# Patient Record
Sex: Male | Born: 1965
Health system: Southern US, Community
[De-identification: ages and names within clinical notes are randomized; demographics above are authoritative.]

## PROBLEM LIST (undated history)

## (undated) DIAGNOSIS — M549 Dorsalgia, unspecified: Secondary | ICD-10-CM

## (undated) DIAGNOSIS — E785 Hyperlipidemia, unspecified: Secondary | ICD-10-CM

## (undated) DIAGNOSIS — F32A Depression, unspecified: Secondary | ICD-10-CM

## (undated) DIAGNOSIS — E079 Disorder of thyroid, unspecified: Secondary | ICD-10-CM

## (undated) DIAGNOSIS — M1990 Unspecified osteoarthritis, unspecified site: Secondary | ICD-10-CM

## (undated) DIAGNOSIS — F79 Unspecified intellectual disabilities: Secondary | ICD-10-CM

## (undated) DIAGNOSIS — T7840XA Allergy, unspecified, initial encounter: Secondary | ICD-10-CM

## (undated) DIAGNOSIS — F29 Unspecified psychosis not due to a substance or known physiological condition: Secondary | ICD-10-CM

## (undated) DIAGNOSIS — G8929 Other chronic pain: Secondary | ICD-10-CM

## (undated) DIAGNOSIS — F99 Mental disorder, not otherwise specified: Secondary | ICD-10-CM

## (undated) DIAGNOSIS — F329 Major depressive disorder, single episode, unspecified: Secondary | ICD-10-CM

## (undated) DIAGNOSIS — F419 Anxiety disorder, unspecified: Secondary | ICD-10-CM

## (undated) DIAGNOSIS — I10 Essential (primary) hypertension: Secondary | ICD-10-CM

## (undated) HISTORY — DX: Other chronic pain: G89.29

## (undated) HISTORY — DX: Anxiety disorder, unspecified: F41.9

## (undated) HISTORY — DX: Hyperlipidemia, unspecified: E78.5

## (undated) HISTORY — DX: Mental disorder, not otherwise specified: F99

## (undated) HISTORY — DX: Allergy, unspecified, initial encounter: T78.40XA

## (undated) HISTORY — DX: Unspecified intellectual disabilities: F79

## (undated) HISTORY — DX: Unspecified osteoarthritis, unspecified site: M19.90

## (undated) HISTORY — DX: Disorder of thyroid, unspecified: E07.9

## (undated) HISTORY — DX: Essential (primary) hypertension: I10

## (undated) HISTORY — DX: Unspecified psychosis not due to a substance or known physiological condition: F29

## (undated) HISTORY — DX: Depression, unspecified: F32.A

## (undated) HISTORY — DX: Major depressive disorder, single episode, unspecified: F32.9

## (undated) HISTORY — DX: Dorsalgia, unspecified: M54.9

---

## 2004-06-15 ENCOUNTER — Encounter (HOSPITAL_COMMUNITY): Admission: RE | Admit: 2004-06-15 | Discharge: 2004-06-15 | Payer: Self-pay | Admitting: Endocrinology

## 2004-06-18 ENCOUNTER — Ambulatory Visit (HOSPITAL_COMMUNITY): Admission: RE | Admit: 2004-06-18 | Discharge: 2004-06-19 | Payer: Self-pay | Admitting: Endocrinology

## 2004-07-06 ENCOUNTER — Inpatient Hospital Stay (HOSPITAL_COMMUNITY): Admission: EM | Admit: 2004-07-06 | Discharge: 2004-07-09 | Payer: Self-pay | Admitting: Emergency Medicine

## 2004-07-16 ENCOUNTER — Ambulatory Visit: Payer: Self-pay | Admitting: Family Medicine

## 2004-08-07 ENCOUNTER — Ambulatory Visit: Payer: Self-pay | Admitting: Family Medicine

## 2004-08-22 ENCOUNTER — Ambulatory Visit: Payer: Self-pay | Admitting: Family Medicine

## 2004-10-02 ENCOUNTER — Ambulatory Visit (HOSPITAL_COMMUNITY): Admission: RE | Admit: 2004-10-02 | Discharge: 2004-10-02 | Payer: Self-pay | Admitting: Family Medicine

## 2004-10-02 ENCOUNTER — Ambulatory Visit: Payer: Self-pay | Admitting: Family Medicine

## 2004-11-13 ENCOUNTER — Ambulatory Visit: Payer: Self-pay | Admitting: Family Medicine

## 2004-11-29 ENCOUNTER — Ambulatory Visit: Payer: Self-pay | Admitting: Family Medicine

## 2004-12-04 ENCOUNTER — Ambulatory Visit (HOSPITAL_COMMUNITY): Admission: RE | Admit: 2004-12-04 | Discharge: 2004-12-04 | Payer: Self-pay | Admitting: Family Medicine

## 2004-12-19 ENCOUNTER — Ambulatory Visit: Payer: Self-pay | Admitting: Family Medicine

## 2005-02-01 ENCOUNTER — Ambulatory Visit: Payer: Self-pay | Admitting: Family Medicine

## 2005-05-12 IMAGING — CT CT PELVIS W/O CM
1 of 2 series · 15 of 32 positions shown, 19 images · non-contrast
Comparison: None.

CLINICAL DATA: Mid abdominal pain. Nausea and vomiting. Renal insufficiency (creatinine 2.0).

CT OF THE ABDOMEN AND PELVIS WITHOUT CONTRAST  07/06/2004:
TECHNIQUE: Multidetector helical CT through the abdomen and pelvis was performed without
intravenous contrast. Oral contrast was given, but the patient vomited this soon after ingestion.

[Series 872: — · axial · 0.62mm/px · z∈[+1386,+1812]mm · 15 of 95 slices shown, 19 images]
[im 5/95  soft-tissue]
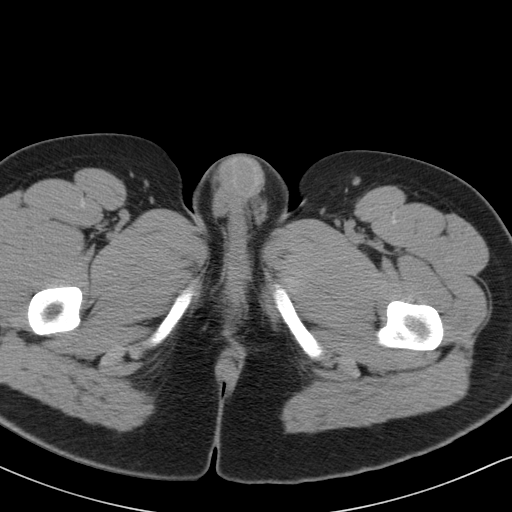
[im 5/95  bone]
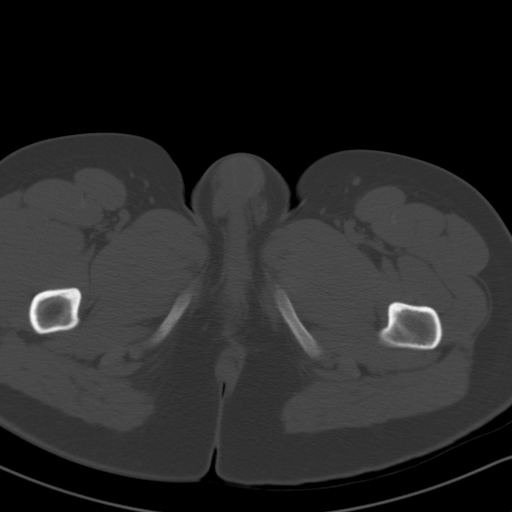
[im 13/95  soft-tissue]
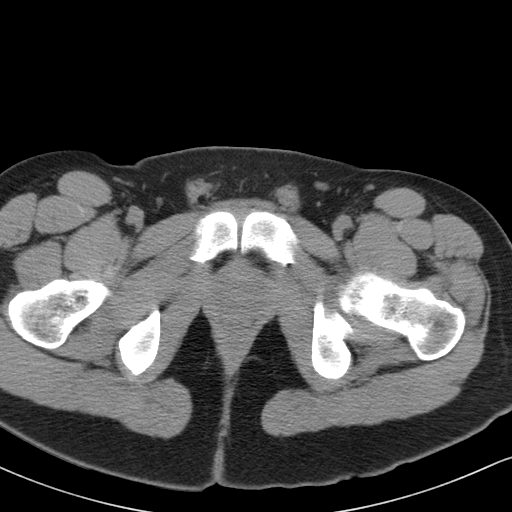
[im 21/95  soft-tissue]
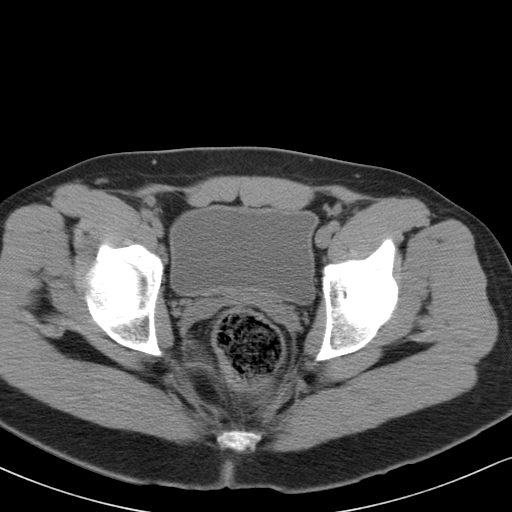
[im 25/95  soft-tissue]
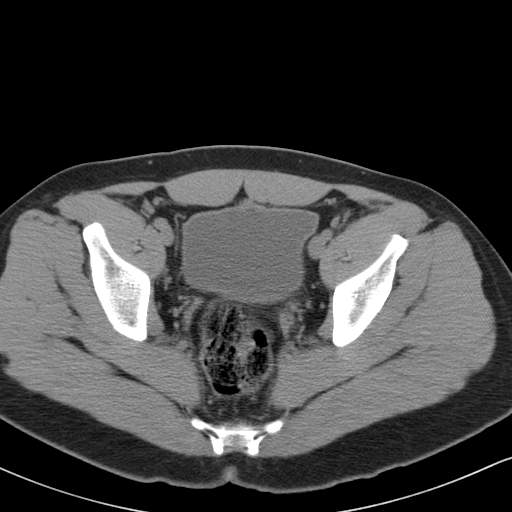
[im 33/95  soft-tissue]
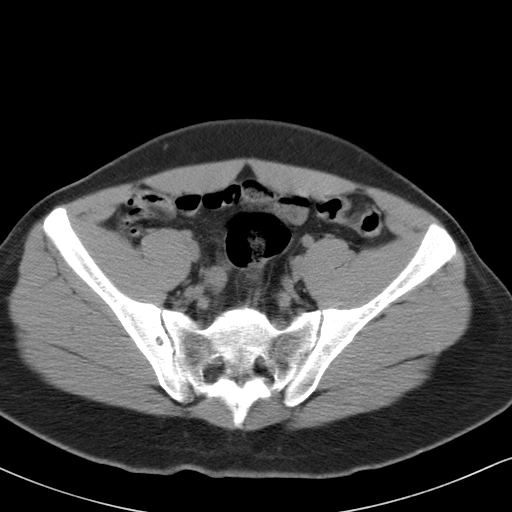
[im 41/95  soft-tissue]
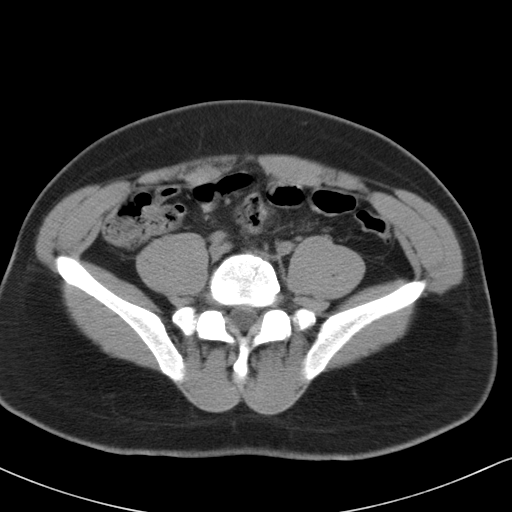
[im 50/95  soft-tissue]
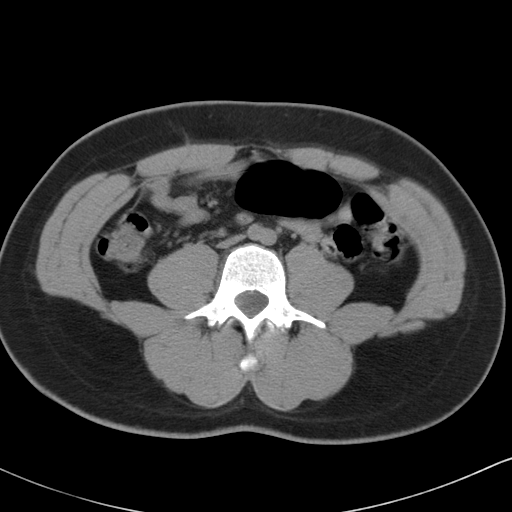
[im 54/95  soft-tissue]
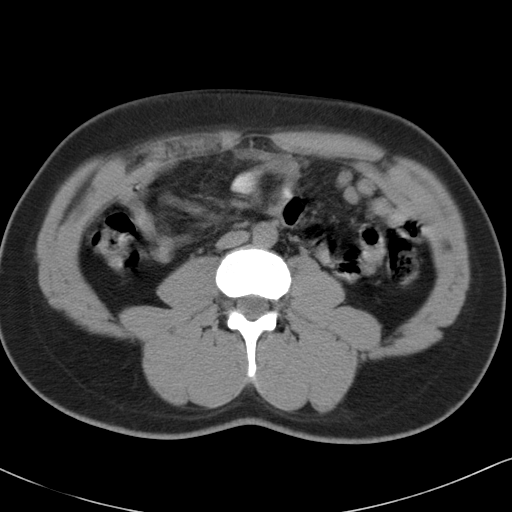
[im 62/95  soft-tissue]
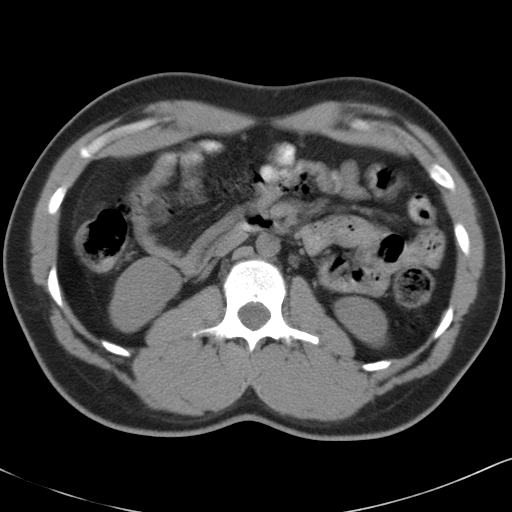
[im 62/95  bone]
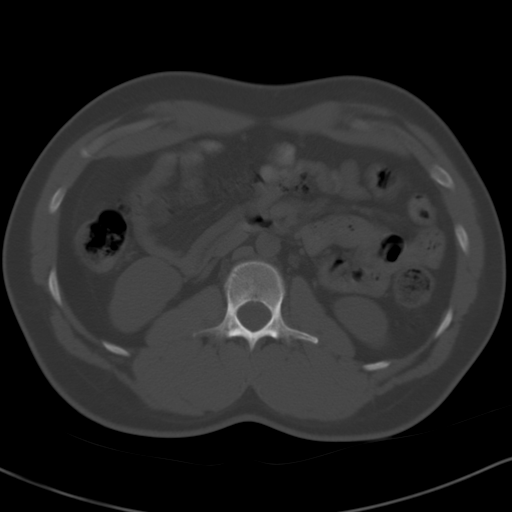
[im 70/95  soft-tissue]
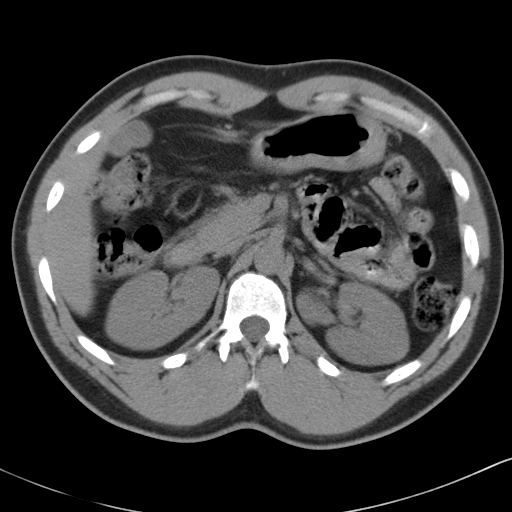
[im 74/95  soft-tissue]
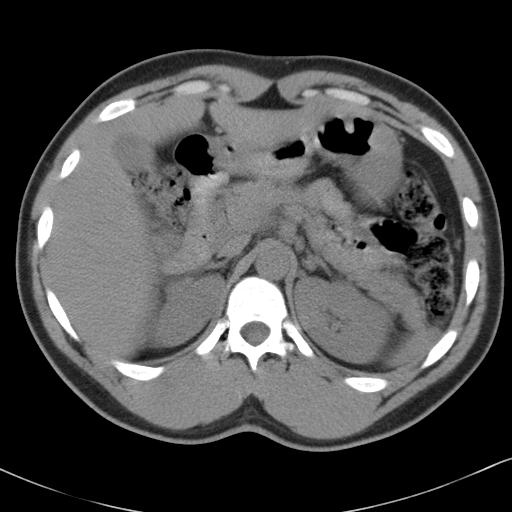
[im 78/95  lung]
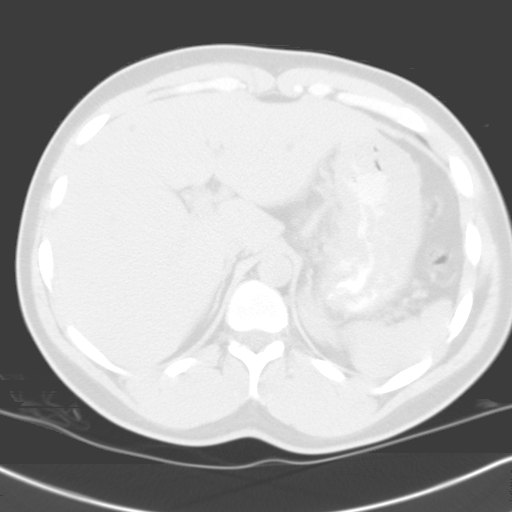
[im 82/95  soft-tissue]
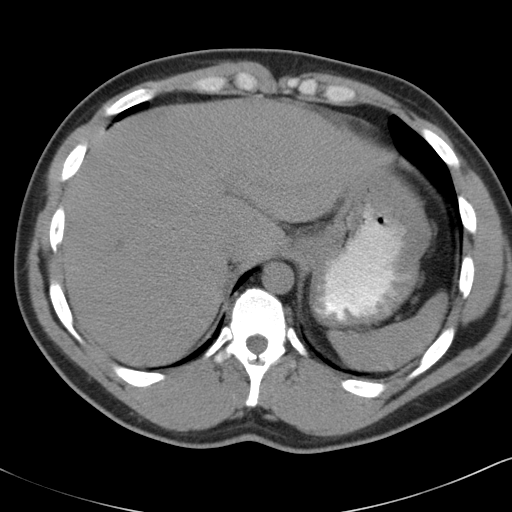
[im 82/95  lung]
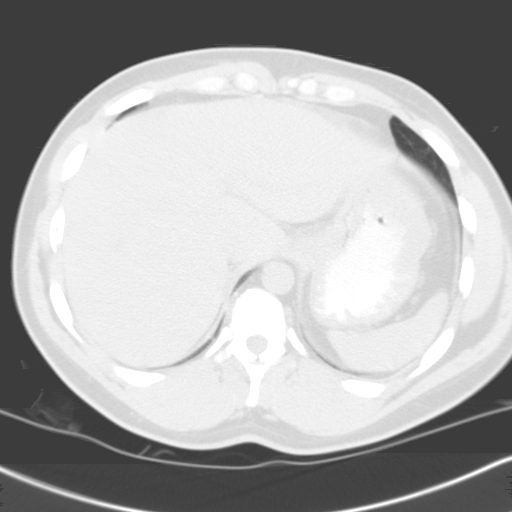
[im 86/95  lung]
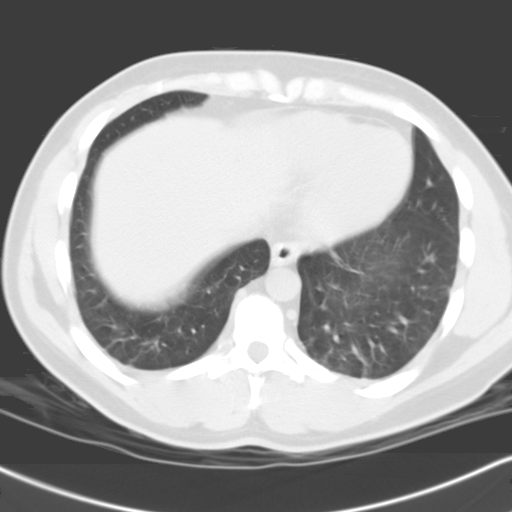
[im 90/95  soft-tissue]
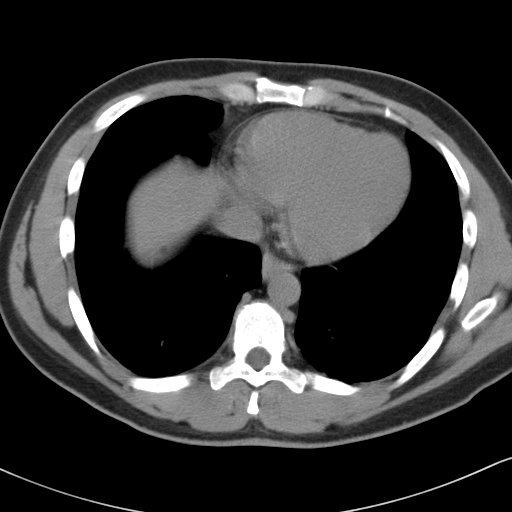
[im 90/95  lung]
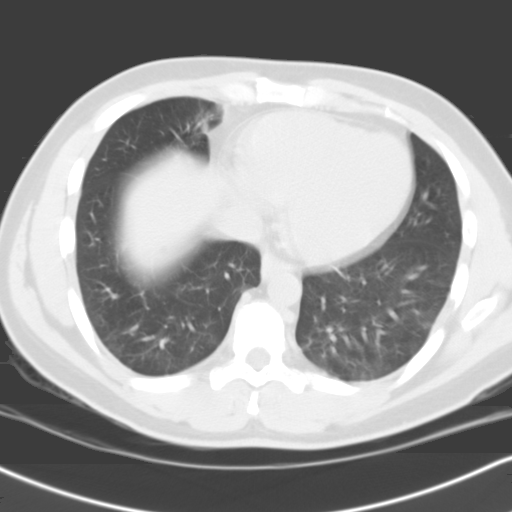

[15 of 32 positions shown; findings below may reference images not displayed]

CT ABDOMEN:

Numerous low attenuation foci are present throughout the liver; their conspicuity for their small
size suggest that they represent cysts. However, in the absence of IV contrast, I cannot state this
with absolute certainty.

The spleen, pancreas, and right adrenal gland are normal appearance. There is a 1 cm nodule
involving the left adrenal gland which is statistically most likely represent a small adenoma. Both
kidneys are unremarkable for the unenhanced technique. The stomach and the visualized large and
small bowel are unremarkable. There is no free fluid. There is no significant lymphadenopathy. The
visualized lung bases appear clear.
IMPRESSION: 1. Multiple low attenuation lesions throughout the liver. Their conspicuity for their small size
suggest that they represent cysts. In the absence of IV contrast, I cannot state this with absolute
certainty and therefore suggest ultrasound for further evaluation.

2. No acute abnormalities in the abdomen.

CT PELVIS:

A large amount of stool is present in the rectum and in the sigmoid colon. There are scattered
distal descending and sigmoid colon diverticula. There is no evidence of diverticulitis. The
appendix is identified in the right mid pelvis and is unremarkable. There is no free fluid. The
urinary bladder is normal. The seminal vesicles and prostate gland are normal for age.
IMPRESSION: 1. No acute abnormality is in the pelvis apart from possible constipation.

2. Scattered descending and sigmoid colon diverticula.

## 2005-05-15 IMAGING — US US ABDOMEN COMPLETE
1 series · 14 of 25 positions shown · non-contrast
Comparison: none

HISTORY: Recurrent nausea and vomiting

ABDOMEN ULTRASOUND COMPLETE:
Gallbladder normal without stones or wall thickening.
Common bile duct normal caliber, 4 mm diameter.
Liver, spleen, and pancreas normal appearance.
Kidneys normal appearance, 10.9 cm length right and 10.6 cm length left.
Aorta and IVC unremarkable.
No ascites.

[Series 1: unknown · 0.34mm/px · 14 of 70 slices shown]
[im 1/70]
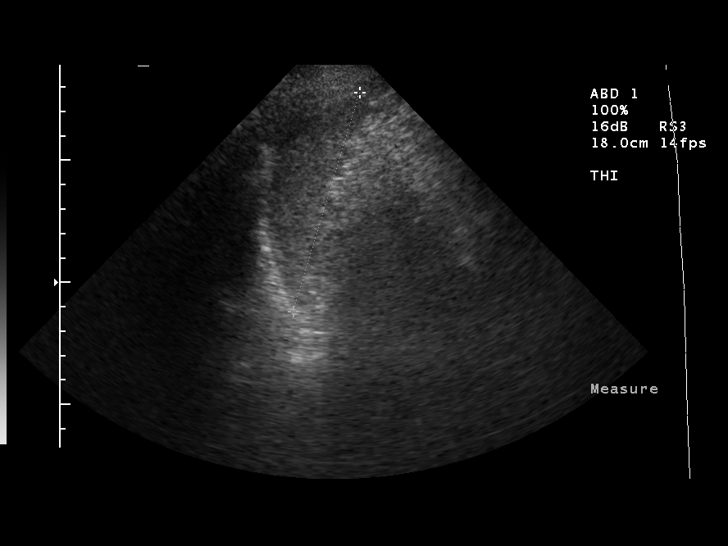
[im 6/70]
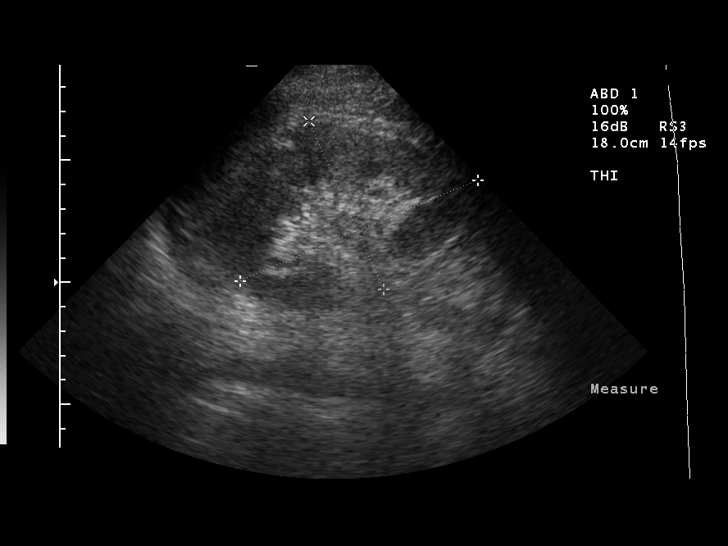
[im 12/70]
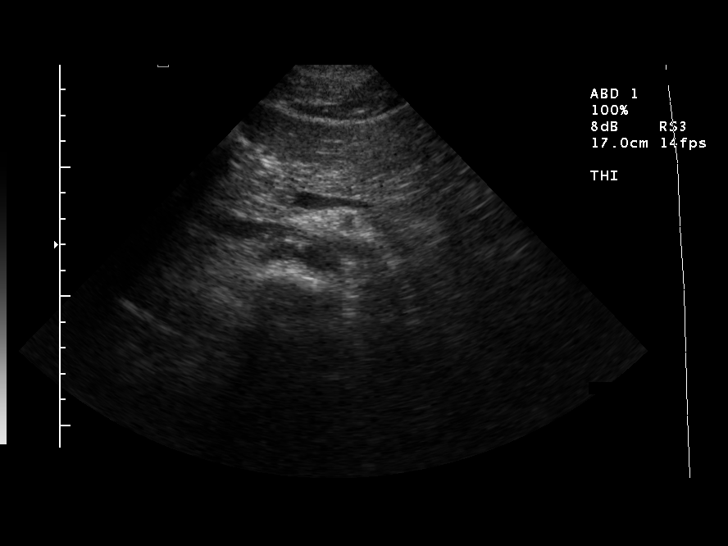
[im 18/70]
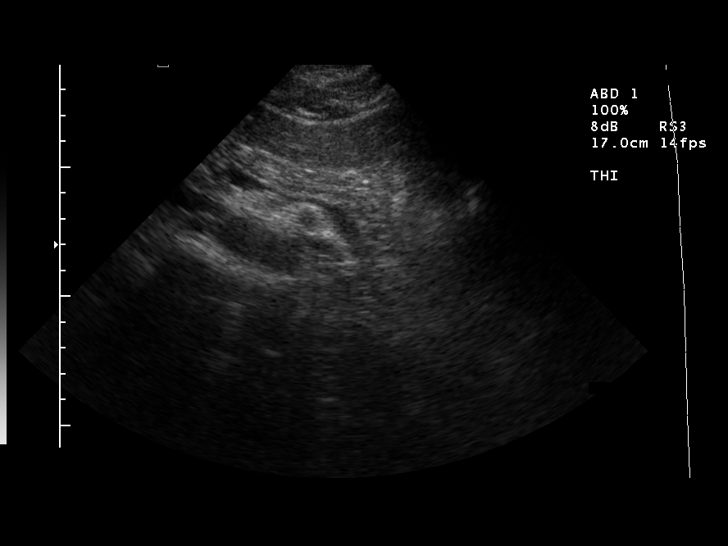
[im 24/70]
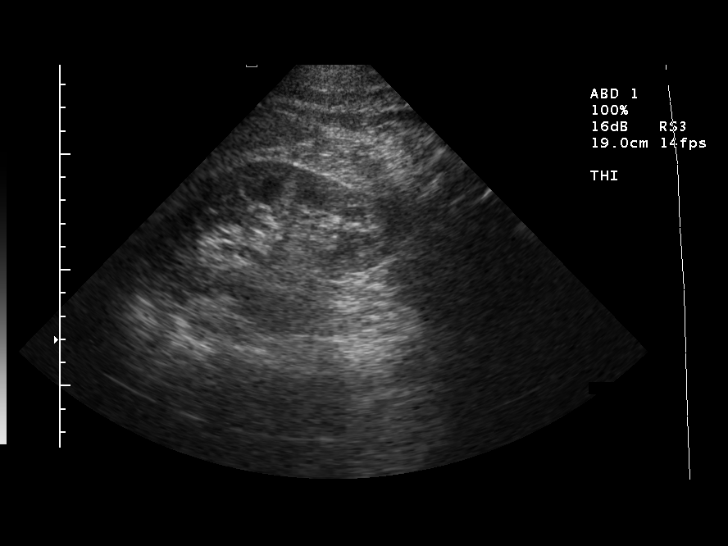
[im 26/70]
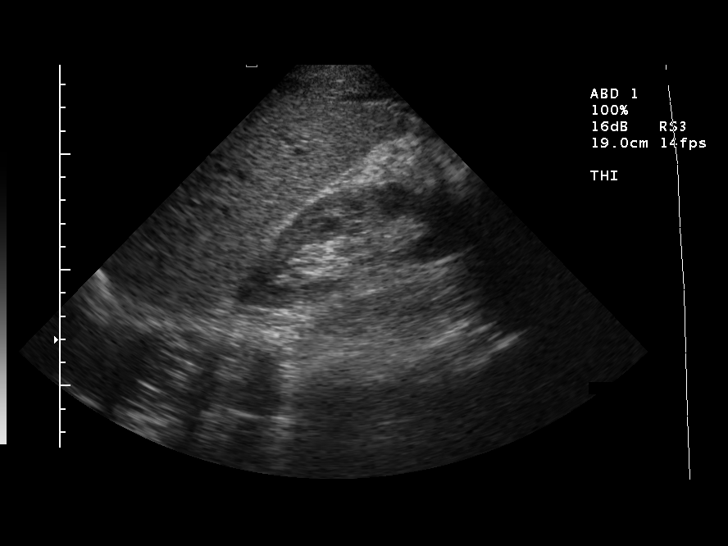
[im 32/70]
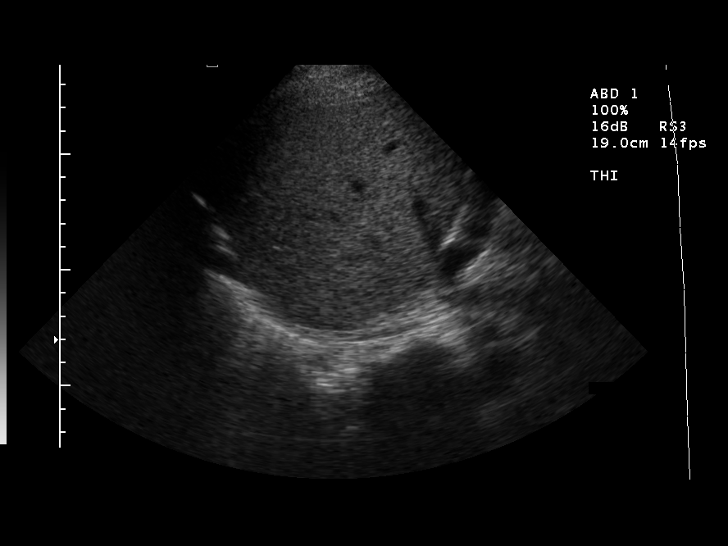
[im 38/70]
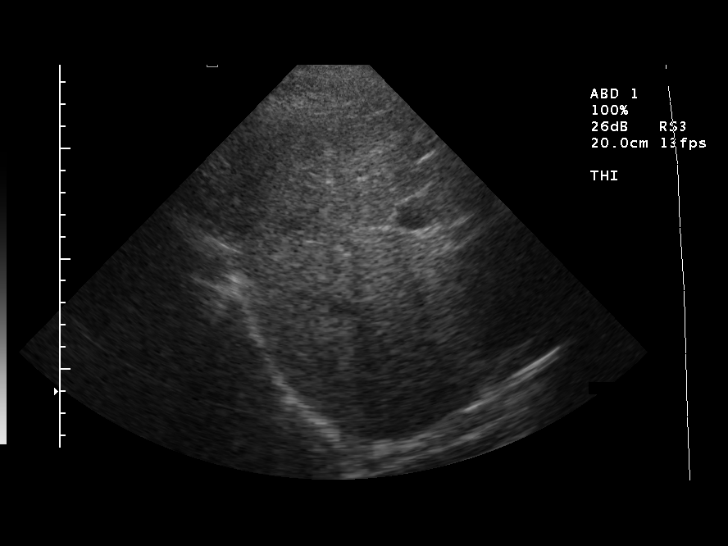
[im 44/70]
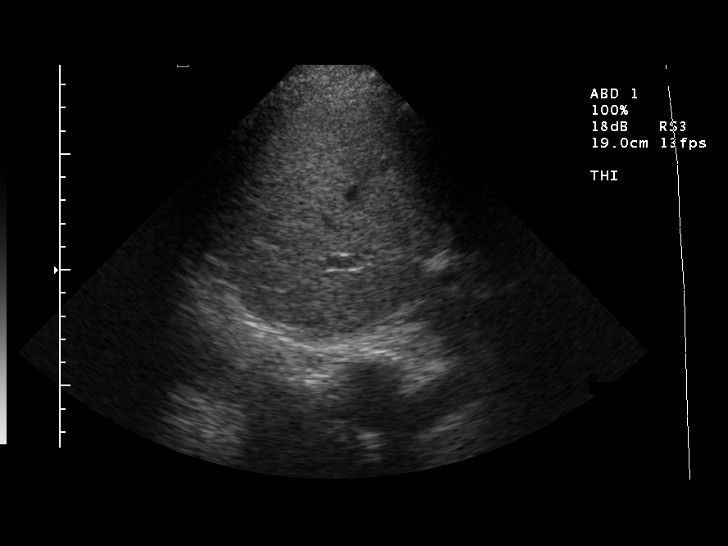
[im 47/70]
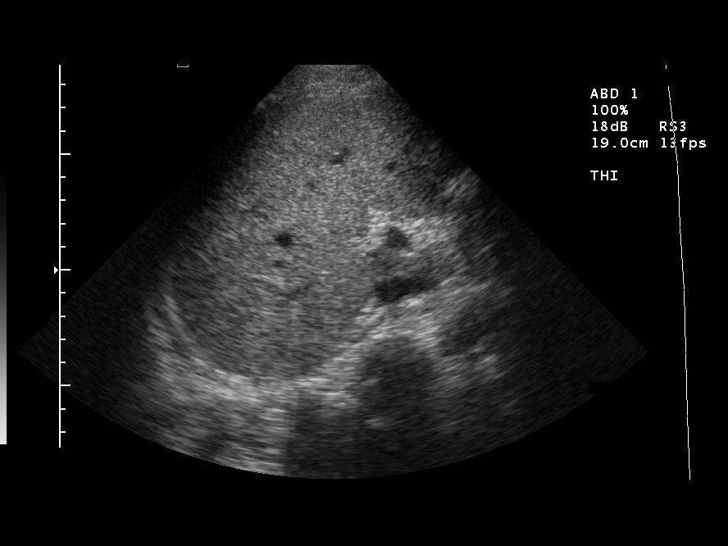
[im 52/70]
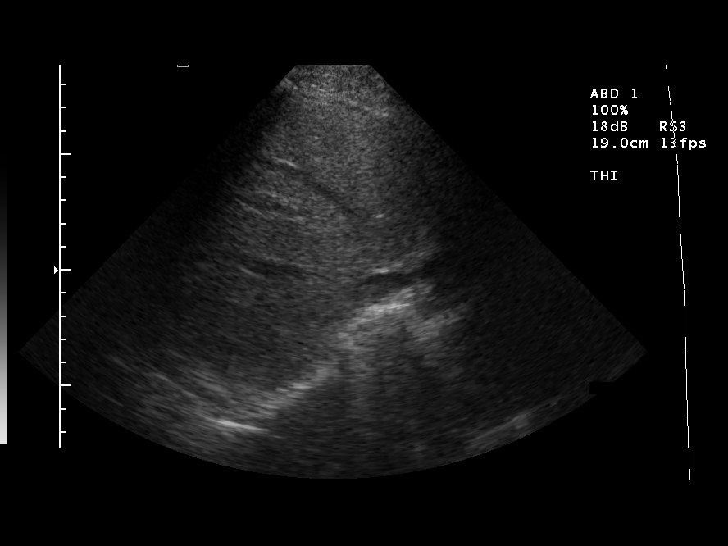
[im 58/70]
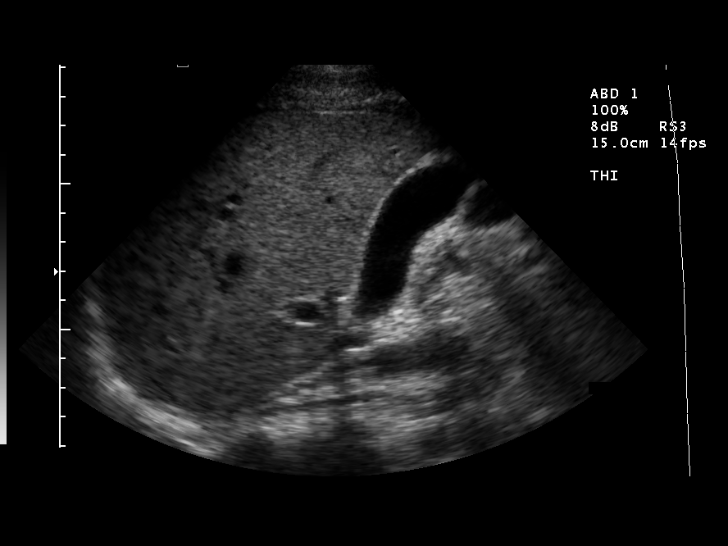
[im 64/70]
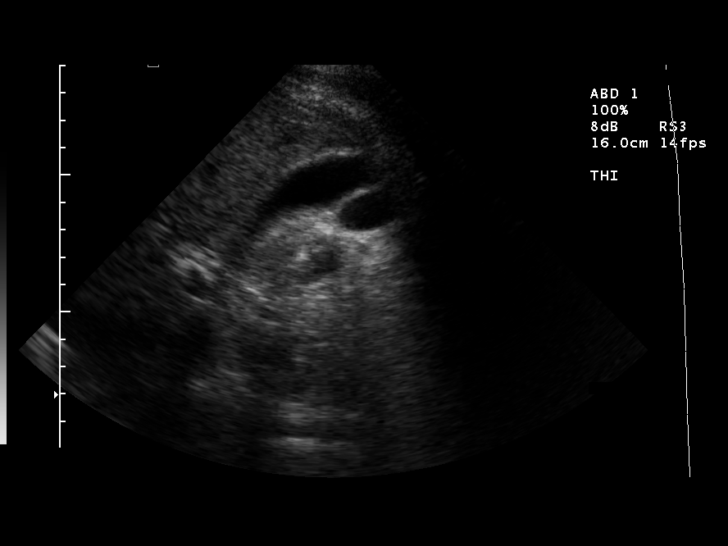
[im 70/70]
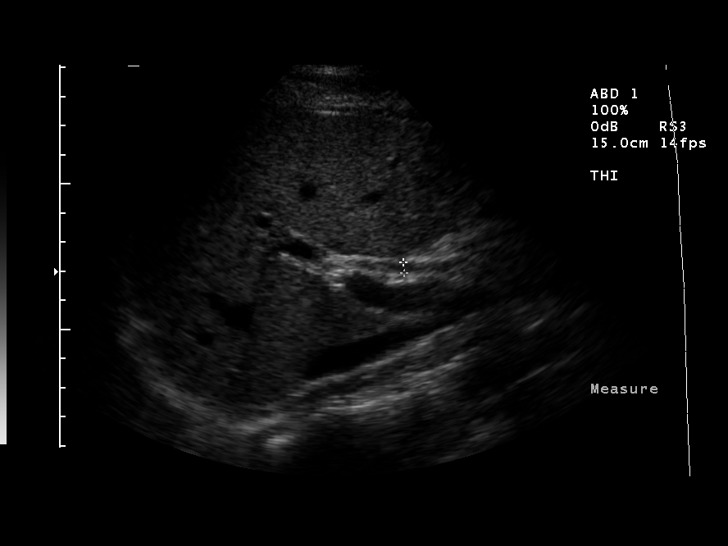

[14 of 25 positions shown; findings below may reference images not displayed]

IMPRESSION: Normal abdominal ultrasound.

## 2005-06-03 ENCOUNTER — Ambulatory Visit: Payer: Self-pay | Admitting: Family Medicine

## 2005-09-05 ENCOUNTER — Ambulatory Visit: Payer: Self-pay | Admitting: Family Medicine

## 2005-10-28 ENCOUNTER — Ambulatory Visit: Payer: Self-pay | Admitting: Family Medicine

## 2006-02-24 ENCOUNTER — Ambulatory Visit: Payer: Self-pay | Admitting: Family Medicine

## 2006-06-16 ENCOUNTER — Ambulatory Visit: Payer: Self-pay | Admitting: Family Medicine

## 2006-06-26 ENCOUNTER — Ambulatory Visit: Payer: Self-pay | Admitting: Family Medicine

## 2006-09-01 ENCOUNTER — Ambulatory Visit: Payer: Self-pay | Admitting: Family Medicine

## 2006-10-15 ENCOUNTER — Ambulatory Visit: Payer: Self-pay | Admitting: Family Medicine

## 2006-11-10 ENCOUNTER — Ambulatory Visit: Payer: Self-pay | Admitting: Family Medicine

## 2006-12-01 ENCOUNTER — Ambulatory Visit: Payer: Self-pay | Admitting: Family Medicine

## 2006-12-22 ENCOUNTER — Ambulatory Visit: Payer: Self-pay | Admitting: Family Medicine

## 2007-02-04 ENCOUNTER — Encounter: Payer: Self-pay | Admitting: Family Medicine

## 2007-02-04 LAB — CONVERTED CEMR LAB
ALT: 23 units/L (ref 0–53)
AST: 23 units/L (ref 0–37)
Albumin: 4.6 g/dL (ref 3.5–5.2)
Alkaline Phosphatase: 83 units/L (ref 39–117)
BUN: 11 mg/dL (ref 6–23)
Basophils Absolute: 0 10*3/uL (ref 0.0–0.1)
Basophils Relative: 1 % (ref 0–1)
Bilirubin, Direct: 0.1 mg/dL (ref 0.0–0.3)
CO2: 27 meq/L (ref 19–32)
Calcium: 10.5 mg/dL (ref 8.4–10.5)
Chloride: 101 meq/L (ref 96–112)
Cholesterol: 180 mg/dL (ref 0–200)
Creatinine, Ser: 1.13 mg/dL (ref 0.40–1.50)
Eosinophils Absolute: 0.3 10*3/uL (ref 0.0–0.7)
Eosinophils Relative: 4 % (ref 0–5)
Glucose, Bld: 83 mg/dL (ref 70–99)
HCT: 47.2 % (ref 39.0–52.0)
HDL: 46 mg/dL (ref >39)
Hemoglobin: 15.3 g/dL (ref 13.0–17.0)
Indirect Bilirubin: 0.4 mg/dL (ref 0.0–0.9)
LDL Cholesterol: 108 mg/dL — ABNORMAL HIGH (ref 0–99)
Lymphocytes Relative: 31 % (ref 12–46)
Lymphs Abs: 2 10*3/uL (ref 0.7–3.3)
MCHC: 32.4 g/dL (ref 30.0–36.0)
MCV: 91.1 fL (ref 78.0–100.0)
Monocytes Absolute: 0.7 10*3/uL (ref 0.2–0.7)
Monocytes Relative: 10 % (ref 3–11)
Neutro Abs: 3.5 10*3/uL (ref 1.7–7.7)
Neutrophils Relative %: 54 % (ref 43–77)
PSA: 0.42 ng/mL
PSA: 0.42 ng/mL (ref 0.10–4.00)
Platelets: 345 10*3/uL (ref 150–400)
Potassium: 4.5 meq/L (ref 3.5–5.3)
RBC: 5.18 M/uL (ref 4.22–5.81)
RDW: 15.1 % — ABNORMAL HIGH (ref 11.5–14.0)
Sodium: 138 meq/L (ref 135–145)
Total Bilirubin: 0.5 mg/dL (ref 0.3–1.2)
Total CHOL/HDL Ratio: 3.9
Total Protein: 7.9 g/dL (ref 6.0–8.3)
Triglycerides: 132 mg/dL (ref <150)
VLDL: 26 mg/dL (ref 0–40)
WBC: 6.5 10*3/uL (ref 4.0–10.5)

## 2007-02-11 ENCOUNTER — Ambulatory Visit: Payer: Self-pay | Admitting: Family Medicine

## 2007-03-30 ENCOUNTER — Ambulatory Visit: Payer: Self-pay | Admitting: Family Medicine

## 2007-03-31 ENCOUNTER — Encounter: Payer: Self-pay | Admitting: Family Medicine

## 2007-03-31 LAB — CONVERTED CEMR LAB
Free T4: 0.96 ng/dL
T3, Free: 3.4 pg/mL
TSH: 0.192 microintl units/mL — ABNORMAL LOW (ref 0.350–5.50)

## 2007-04-20 ENCOUNTER — Ambulatory Visit: Payer: Self-pay | Admitting: Family Medicine

## 2007-06-10 ENCOUNTER — Ambulatory Visit: Payer: Self-pay | Admitting: Family Medicine

## 2007-07-08 ENCOUNTER — Ambulatory Visit: Payer: Self-pay | Admitting: Family Medicine

## 2007-09-03 ENCOUNTER — Encounter: Payer: Self-pay | Admitting: Family Medicine

## 2007-09-08 ENCOUNTER — Ambulatory Visit: Payer: Self-pay | Admitting: Family Medicine

## 2007-10-13 ENCOUNTER — Encounter: Payer: Self-pay | Admitting: Family Medicine

## 2007-10-13 LAB — CONVERTED CEMR LAB
ALT: 26 units/L (ref 0–53)
AST: 20 units/L (ref 0–37)
Albumin: 4.9 g/dL (ref 3.5–5.2)
Alkaline Phosphatase: 87 units/L (ref 39–117)
BUN: 8 mg/dL (ref 6–23)
Bilirubin, Direct: 0.1 mg/dL (ref 0.0–0.3)
CO2: 25 meq/L (ref 19–32)
Calcium: 11.1 mg/dL — ABNORMAL HIGH (ref 8.4–10.5)
Chloride: 100 meq/L (ref 96–112)
Cholesterol: 185 mg/dL (ref 0–200)
Creatinine, Ser: 1.09 mg/dL (ref 0.40–1.50)
Glucose, Bld: 91 mg/dL (ref 70–99)
HDL: 46 mg/dL (ref >39)
Indirect Bilirubin: 0.3 mg/dL (ref 0.0–0.9)
LDL Cholesterol: 113 mg/dL — ABNORMAL HIGH (ref 0–99)
Potassium: 4.1 meq/L (ref 3.5–5.3)
Sodium: 139 meq/L (ref 135–145)
Total Bilirubin: 0.4 mg/dL (ref 0.3–1.2)
Total CHOL/HDL Ratio: 4
Total Protein: 8 g/dL (ref 6.0–8.3)
Triglycerides: 129 mg/dL (ref <150)
VLDL: 26 mg/dL (ref 0–40)

## 2007-10-20 ENCOUNTER — Ambulatory Visit: Payer: Self-pay | Admitting: Family Medicine

## 2007-10-26 ENCOUNTER — Ambulatory Visit: Payer: Self-pay | Admitting: Family Medicine

## 2007-11-05 ENCOUNTER — Ambulatory Visit: Payer: Self-pay | Admitting: Family Medicine

## 2007-11-05 LAB — CONVERTED CEMR LAB: TSH: 0.27 microintl units/mL — ABNORMAL LOW (ref 0.350–5.50)

## 2007-11-06 ENCOUNTER — Encounter: Payer: Self-pay | Admitting: Family Medicine

## 2007-11-06 LAB — CONVERTED CEMR LAB
Free T4: 1.08 ng/dL (ref 0.89–1.80)
T3, Free: 3.3 pg/mL (ref 2.3–4.2)

## 2007-11-12 ENCOUNTER — Ambulatory Visit (HOSPITAL_COMMUNITY): Admission: RE | Admit: 2007-11-12 | Discharge: 2007-11-12 | Payer: Self-pay | Admitting: Family Medicine

## 2007-12-14 ENCOUNTER — Ambulatory Visit: Payer: Self-pay | Admitting: Family Medicine

## 2008-02-17 ENCOUNTER — Ambulatory Visit: Payer: Self-pay | Admitting: Family Medicine

## 2008-03-18 ENCOUNTER — Encounter: Payer: Self-pay | Admitting: Family Medicine

## 2008-03-18 LAB — CONVERTED CEMR LAB
ALT: 32 units/L (ref 0–53)
AST: 23 units/L (ref 0–37)
Albumin: 4.8 g/dL (ref 3.5–5.2)
Alkaline Phosphatase: 93 units/L (ref 39–117)
BUN: 9 mg/dL (ref 6–23)
Bilirubin, Direct: 0.1 mg/dL (ref 0.0–0.3)
CO2: 25 meq/L (ref 19–32)
Calcium: 10.7 mg/dL — ABNORMAL HIGH (ref 8.4–10.5)
Chloride: 102 meq/L (ref 96–112)
Cholesterol: 187 mg/dL (ref 0–200)
Creatinine, Ser: 0.98 mg/dL (ref 0.40–1.50)
Glucose, Bld: 103 mg/dL — ABNORMAL HIGH (ref 70–99)
HDL: 48 mg/dL (ref >39)
Indirect Bilirubin: 0.3 mg/dL (ref 0.0–0.9)
LDL Cholesterol: 115 mg/dL — ABNORMAL HIGH (ref 0–99)
PSA: 0.58 ng/mL (ref 0.10–4.00)
Potassium: 4.3 meq/L (ref 3.5–5.3)
Sodium: 139 meq/L (ref 135–145)
Total Bilirubin: 0.4 mg/dL (ref 0.3–1.2)
Total CHOL/HDL Ratio: 3.9
Total Protein: 7.7 g/dL (ref 6.0–8.3)
Triglycerides: 120 mg/dL (ref <150)
VLDL: 24 mg/dL (ref 0–40)

## 2008-03-21 ENCOUNTER — Encounter: Payer: Self-pay | Admitting: Family Medicine

## 2008-03-21 DIAGNOSIS — F29 Unspecified psychosis not due to a substance or known physiological condition: Secondary | ICD-10-CM

## 2008-03-21 DIAGNOSIS — E782 Mixed hyperlipidemia: Secondary | ICD-10-CM | POA: Insufficient documentation

## 2008-03-21 DIAGNOSIS — I10 Essential (primary) hypertension: Secondary | ICD-10-CM

## 2008-03-21 DIAGNOSIS — E785 Hyperlipidemia, unspecified: Secondary | ICD-10-CM

## 2008-03-21 DIAGNOSIS — M549 Dorsalgia, unspecified: Secondary | ICD-10-CM | POA: Insufficient documentation

## 2008-03-21 DIAGNOSIS — F79 Unspecified intellectual disabilities: Secondary | ICD-10-CM

## 2008-03-28 ENCOUNTER — Ambulatory Visit: Payer: Self-pay | Admitting: Family Medicine

## 2008-04-06 ENCOUNTER — Ambulatory Visit: Payer: Self-pay | Admitting: Family Medicine

## 2008-04-06 ENCOUNTER — Ambulatory Visit (HOSPITAL_COMMUNITY): Admission: RE | Admit: 2008-04-06 | Discharge: 2008-04-06 | Payer: Self-pay | Admitting: Family Medicine

## 2008-05-03 ENCOUNTER — Encounter: Payer: Self-pay | Admitting: Family Medicine

## 2008-06-07 ENCOUNTER — Encounter: Payer: Self-pay | Admitting: Family Medicine

## 2008-06-08 ENCOUNTER — Ambulatory Visit: Payer: Self-pay | Admitting: Family Medicine

## 2008-06-13 ENCOUNTER — Encounter: Payer: Self-pay | Admitting: Family Medicine

## 2008-06-13 LAB — CONVERTED CEMR LAB
ALT: 25 units/L (ref 0–53)
AST: 23 units/L (ref 0–37)
Albumin: 5 g/dL (ref 3.5–5.2)
Alkaline Phosphatase: 85 units/L (ref 39–117)
BUN: 8 mg/dL (ref 6–23)
Bilirubin, Direct: 0.1 mg/dL (ref 0.0–0.3)
CO2: 23 meq/L (ref 19–32)
Calcium: 10.7 mg/dL — ABNORMAL HIGH (ref 8.4–10.5)
Chloride: 101 meq/L (ref 96–112)
Cholesterol: 154 mg/dL (ref 0–200)
Creatinine, Ser: 1.02 mg/dL (ref 0.40–1.50)
Glucose, Bld: 94 mg/dL (ref 70–99)
HDL: 47 mg/dL (ref >39)
Indirect Bilirubin: 0.4 mg/dL (ref 0.0–0.9)
LDL Cholesterol: 88 mg/dL (ref 0–99)
Potassium: 4.6 meq/L (ref 3.5–5.3)
Sodium: 140 meq/L (ref 135–145)
Total Bilirubin: 0.5 mg/dL (ref 0.3–1.2)
Total CHOL/HDL Ratio: 3.3
Total Protein: 7.8 g/dL (ref 6.0–8.3)
Triglycerides: 97 mg/dL (ref <150)
VLDL: 19 mg/dL (ref 0–40)

## 2008-06-28 ENCOUNTER — Ambulatory Visit: Payer: Self-pay | Admitting: Family Medicine

## 2008-08-01 ENCOUNTER — Encounter: Payer: Self-pay | Admitting: Family Medicine

## 2008-08-10 ENCOUNTER — Ambulatory Visit: Payer: Self-pay | Admitting: Family Medicine

## 2008-08-11 ENCOUNTER — Encounter: Payer: Self-pay | Admitting: Family Medicine

## 2008-09-30 ENCOUNTER — Ambulatory Visit: Payer: Self-pay | Admitting: Family Medicine

## 2008-10-11 ENCOUNTER — Encounter: Payer: Self-pay | Admitting: Family Medicine

## 2008-10-26 ENCOUNTER — Encounter: Payer: Self-pay | Admitting: Family Medicine

## 2008-11-01 ENCOUNTER — Ambulatory Visit: Payer: Self-pay | Admitting: Family Medicine

## 2008-11-29 ENCOUNTER — Ambulatory Visit: Payer: Self-pay | Admitting: Family Medicine

## 2008-11-30 ENCOUNTER — Encounter: Payer: Self-pay | Admitting: Family Medicine

## 2008-12-21 ENCOUNTER — Encounter: Payer: Self-pay | Admitting: Family Medicine

## 2008-12-23 LAB — CONVERTED CEMR LAB
ALT: 40 units/L (ref 0–53)
AST: 30 units/L (ref 0–37)
Albumin: 4.6 g/dL (ref 3.5–5.2)
Alkaline Phosphatase: 93 units/L (ref 39–117)
BUN: 8 mg/dL (ref 6–23)
Basophils Absolute: 0 10*3/uL (ref 0.0–0.1)
Basophils Relative: 0 % (ref 0–1)
Bilirubin, Direct: 0.1 mg/dL (ref 0.0–0.3)
CO2: 28 meq/L (ref 19–32)
Calcium: 11.3 mg/dL — ABNORMAL HIGH (ref 8.4–10.5)
Chloride: 103 meq/L (ref 96–112)
Cholesterol: 167 mg/dL (ref 0–200)
Creatinine, Ser: 1.06 mg/dL (ref 0.40–1.50)
Eosinophils Absolute: 0.2 10*3/uL (ref 0.0–0.7)
Eosinophils Relative: 3 % (ref 0–5)
Glucose, Bld: 91 mg/dL (ref 70–99)
HCT: 47.8 % (ref 39.0–52.0)
HDL: 41 mg/dL (ref >39)
Hemoglobin: 16 g/dL (ref 13.0–17.0)
Indirect Bilirubin: 0.4 mg/dL (ref 0.0–0.9)
LDL Cholesterol: 103 mg/dL — ABNORMAL HIGH (ref 0–99)
Lymphocytes Relative: 28 % (ref 12–46)
Lymphs Abs: 1.9 10*3/uL (ref 0.7–4.0)
MCHC: 33.5 g/dL (ref 30.0–36.0)
MCV: 90.9 fL (ref 78.0–100.0)
Monocytes Absolute: 0.6 10*3/uL (ref 0.1–1.0)
Monocytes Relative: 9 % (ref 3–12)
Neutro Abs: 4.2 10*3/uL (ref 1.7–7.7)
Neutrophils Relative %: 60 % (ref 43–77)
Platelets: 310 10*3/uL (ref 150–400)
Potassium: 4.4 meq/L (ref 3.5–5.3)
RBC: 5.26 M/uL (ref 4.22–5.81)
RDW: 14.6 % (ref 11.5–15.5)
Sodium: 142 meq/L (ref 135–145)
TSH: 0.277 microintl units/mL — ABNORMAL LOW (ref 0.350–4.500)
Total Bilirubin: 0.5 mg/dL (ref 0.3–1.2)
Total CHOL/HDL Ratio: 4.1
Total Protein: 7.8 g/dL (ref 6.0–8.3)
Triglycerides: 116 mg/dL (ref <150)
VLDL: 23 mg/dL (ref 0–40)
WBC: 7 10*3/uL (ref 4.0–10.5)

## 2009-01-02 ENCOUNTER — Ambulatory Visit: Payer: Self-pay | Admitting: Family Medicine

## 2009-01-02 DIAGNOSIS — R471 Dysarthria and anarthria: Secondary | ICD-10-CM

## 2009-01-16 ENCOUNTER — Encounter: Payer: Self-pay | Admitting: Family Medicine

## 2009-01-19 ENCOUNTER — Encounter: Payer: Self-pay | Admitting: Family Medicine

## 2009-01-25 ENCOUNTER — Telehealth: Payer: Self-pay | Admitting: Family Medicine

## 2009-01-26 ENCOUNTER — Ambulatory Visit: Payer: Self-pay | Admitting: Family Medicine

## 2009-01-27 ENCOUNTER — Encounter: Payer: Self-pay | Admitting: Family Medicine

## 2009-03-31 ENCOUNTER — Ambulatory Visit: Payer: Self-pay | Admitting: Family Medicine

## 2009-04-04 ENCOUNTER — Encounter: Payer: Self-pay | Admitting: Family Medicine

## 2009-04-24 ENCOUNTER — Ambulatory Visit: Payer: Self-pay | Admitting: Family Medicine

## 2009-04-24 LAB — CONVERTED CEMR LAB: PSA: 0.54 ng/mL (ref 0.10–4.00)

## 2009-05-26 ENCOUNTER — Ambulatory Visit: Payer: Self-pay | Admitting: Family Medicine

## 2009-06-07 ENCOUNTER — Ambulatory Visit: Payer: Self-pay | Admitting: Family Medicine

## 2009-07-31 ENCOUNTER — Ambulatory Visit: Payer: Self-pay | Admitting: Family Medicine

## 2009-08-01 ENCOUNTER — Encounter: Payer: Self-pay | Admitting: Family Medicine

## 2009-09-26 ENCOUNTER — Ambulatory Visit: Payer: Self-pay | Admitting: Family Medicine

## 2009-09-26 ENCOUNTER — Encounter: Payer: Self-pay | Admitting: Family Medicine

## 2009-09-27 ENCOUNTER — Encounter: Payer: Self-pay | Admitting: Family Medicine

## 2009-10-03 LAB — CONVERTED CEMR LAB
ALT: 26 units/L (ref 0–53)
AST: 23 units/L (ref 0–37)
Albumin: 4.8 g/dL (ref 3.5–5.2)
Alkaline Phosphatase: 99 units/L (ref 39–117)
BUN: 11 mg/dL (ref 6–23)
Bilirubin, Direct: 0.1 mg/dL (ref 0.0–0.3)
CO2: 28 meq/L (ref 19–32)
Calcium: 11 mg/dL — ABNORMAL HIGH (ref 8.4–10.5)
Chloride: 99 meq/L (ref 96–112)
Creatinine, Ser: 1.04 mg/dL (ref 0.40–1.50)
Glucose, Bld: 94 mg/dL (ref 70–99)
Indirect Bilirubin: 0.4 mg/dL (ref 0.0–0.9)
Potassium: 4.3 meq/L (ref 3.5–5.3)
Sodium: 138 meq/L (ref 135–145)
TSH: 0.266 microintl units/mL — ABNORMAL LOW (ref 0.350–4.500)
Total Bilirubin: 0.5 mg/dL (ref 0.3–1.2)
Total Protein: 7.8 g/dL (ref 6.0–8.3)

## 2009-10-06 ENCOUNTER — Encounter: Payer: Self-pay | Admitting: Family Medicine

## 2009-10-11 ENCOUNTER — Ambulatory Visit: Payer: Self-pay | Admitting: Family Medicine

## 2009-10-13 LAB — CONVERTED CEMR LAB
Basophils Absolute: 0 10*3/uL (ref 0.0–0.1)
Basophils Relative: 1 % (ref 0–1)
Calcium, Total (PTH): 10.4 mg/dL (ref 8.4–10.5)
Cholesterol: 171 mg/dL (ref 0–200)
Eosinophils Absolute: 0.2 10*3/uL (ref 0.0–0.7)
Eosinophils Relative: 3 % (ref 0–5)
HCT: 45.7 % (ref 39.0–52.0)
HDL: 44 mg/dL (ref >39)
Hemoglobin: 14.7 g/dL (ref 13.0–17.0)
LDL Cholesterol: 103 mg/dL — ABNORMAL HIGH (ref 0–99)
Lymphocytes Relative: 26 % (ref 12–46)
Lymphs Abs: 1.6 10*3/uL (ref 0.7–4.0)
MCHC: 32.2 g/dL (ref 30.0–36.0)
MCV: 91.8 fL (ref 78.0–100.0)
Monocytes Absolute: 0.6 10*3/uL (ref 0.1–1.0)
Monocytes Relative: 9 % (ref 3–12)
Neutro Abs: 3.7 10*3/uL (ref 1.7–7.7)
Neutrophils Relative %: 61 % (ref 43–77)
PTH: 126 pg/mL — ABNORMAL HIGH (ref 14.0–72.0)
Platelets: 256 10*3/uL (ref 150–400)
RBC: 4.98 M/uL (ref 4.22–5.81)
RDW: 14.9 % (ref 11.5–15.5)
Total CHOL/HDL Ratio: 3.9
Triglycerides: 119 mg/dL (ref <150)
VLDL: 24 mg/dL (ref 0–40)
Vit D, 25-Hydroxy: <10 ng/mL — ABNORMAL LOW (ref 30–89)
WBC: 6 10*3/uL (ref 4.0–10.5)

## 2009-10-16 ENCOUNTER — Telehealth: Payer: Self-pay | Admitting: Family Medicine

## 2009-10-27 ENCOUNTER — Encounter: Payer: Self-pay | Admitting: Family Medicine

## 2009-11-27 ENCOUNTER — Ambulatory Visit: Payer: Self-pay | Admitting: Family Medicine

## 2009-12-26 ENCOUNTER — Ambulatory Visit: Payer: Self-pay | Admitting: Family Medicine

## 2009-12-26 DIAGNOSIS — R5381 Other malaise: Secondary | ICD-10-CM

## 2009-12-26 DIAGNOSIS — R000 Tachycardia, unspecified: Secondary | ICD-10-CM | POA: Insufficient documentation

## 2009-12-26 DIAGNOSIS — R519 Headache, unspecified: Secondary | ICD-10-CM | POA: Insufficient documentation

## 2009-12-26 DIAGNOSIS — R5383 Other fatigue: Secondary | ICD-10-CM

## 2009-12-26 DIAGNOSIS — R51 Headache: Secondary | ICD-10-CM

## 2009-12-27 LAB — CONVERTED CEMR LAB
BUN: 7 mg/dL (ref 6–23)
Basophils Absolute: 0 10*3/uL (ref 0.0–0.1)
Basophils Relative: 0 % (ref 0–1)
CO2: 27 meq/L (ref 19–32)
Calcium: 10.7 mg/dL — ABNORMAL HIGH (ref 8.4–10.5)
Chloride: 100 meq/L (ref 96–112)
Creatinine, Ser: 1.02 mg/dL (ref 0.40–1.50)
Eosinophils Absolute: 0.2 10*3/uL (ref 0.0–0.7)
Eosinophils Relative: 4 % (ref 0–5)
Glucose, Bld: 94 mg/dL (ref 70–99)
HCT: 46.8 % (ref 39.0–52.0)
Hemoglobin: 15 g/dL (ref 13.0–17.0)
Lymphocytes Relative: 30 % (ref 12–46)
Lymphs Abs: 1.8 10*3/uL (ref 0.7–4.0)
MCHC: 32.1 g/dL (ref 30.0–36.0)
MCV: 90.9 fL (ref 78.0–100.0)
Monocytes Absolute: 0.6 10*3/uL (ref 0.1–1.0)
Monocytes Relative: 11 % (ref 3–12)
Neutro Abs: 3.3 10*3/uL (ref 1.7–7.7)
Neutrophils Relative %: 56 % (ref 43–77)
Platelets: 311 10*3/uL (ref 150–400)
Potassium: 4.2 meq/L (ref 3.5–5.3)
RBC: 5.15 M/uL (ref 4.22–5.81)
RDW: 14.7 % (ref 11.5–15.5)
Sodium: 138 meq/L (ref 135–145)
TSH: 0.337 microintl units/mL — ABNORMAL LOW (ref 0.350–4.500)
WBC: 5.9 10*3/uL (ref 4.0–10.5)

## 2009-12-28 ENCOUNTER — Ambulatory Visit (HOSPITAL_COMMUNITY): Admission: RE | Admit: 2009-12-28 | Discharge: 2009-12-28 | Payer: Self-pay | Admitting: Family Medicine

## 2010-01-11 ENCOUNTER — Encounter (HOSPITAL_COMMUNITY): Admission: RE | Admit: 2010-01-11 | Discharge: 2010-03-06 | Payer: Self-pay | Admitting: Internal Medicine

## 2010-01-12 ENCOUNTER — Encounter: Payer: Self-pay | Admitting: Family Medicine

## 2010-01-15 ENCOUNTER — Encounter: Payer: Self-pay | Admitting: Family Medicine

## 2010-01-19 ENCOUNTER — Telehealth: Payer: Self-pay | Admitting: Family Medicine

## 2010-01-23 ENCOUNTER — Encounter: Payer: Self-pay | Admitting: Family Medicine

## 2010-01-23 ENCOUNTER — Ambulatory Visit: Payer: Self-pay | Admitting: Family Medicine

## 2010-01-25 ENCOUNTER — Encounter: Payer: Self-pay | Admitting: Family Medicine

## 2010-01-30 ENCOUNTER — Encounter: Payer: Self-pay | Admitting: Family Medicine

## 2010-02-06 ENCOUNTER — Telehealth: Payer: Self-pay | Admitting: Family Medicine

## 2010-02-07 ENCOUNTER — Telehealth: Payer: Self-pay | Admitting: Family Medicine

## 2010-02-07 ENCOUNTER — Ambulatory Visit: Payer: Self-pay | Admitting: Family Medicine

## 2010-02-07 DIAGNOSIS — F39 Unspecified mood [affective] disorder: Secondary | ICD-10-CM

## 2010-02-08 ENCOUNTER — Inpatient Hospital Stay (HOSPITAL_COMMUNITY): Admission: EM | Admit: 2010-02-08 | Discharge: 2010-02-15 | Payer: Self-pay | Admitting: Emergency Medicine

## 2010-02-08 DIAGNOSIS — F39 Unspecified mood [affective] disorder: Secondary | ICD-10-CM

## 2010-02-09 DIAGNOSIS — F39 Unspecified mood [affective] disorder: Secondary | ICD-10-CM

## 2010-02-10 ENCOUNTER — Ambulatory Visit: Payer: Self-pay | Admitting: Psychiatry

## 2010-02-12 ENCOUNTER — Telehealth: Payer: Self-pay | Admitting: Family Medicine

## 2010-03-02 ENCOUNTER — Encounter: Payer: Self-pay | Admitting: Family Medicine

## 2010-03-06 ENCOUNTER — Ambulatory Visit: Payer: Self-pay | Admitting: Family Medicine

## 2010-03-08 ENCOUNTER — Telehealth: Payer: Self-pay | Admitting: Family Medicine

## 2010-03-08 ENCOUNTER — Encounter: Payer: Self-pay | Admitting: Family Medicine

## 2010-03-13 ENCOUNTER — Telehealth: Payer: Self-pay | Admitting: Family Medicine

## 2010-03-28 ENCOUNTER — Emergency Department (HOSPITAL_COMMUNITY): Admission: EM | Admit: 2010-03-28 | Discharge: 2010-03-31 | Payer: Self-pay | Admitting: Emergency Medicine

## 2010-04-18 ENCOUNTER — Ambulatory Visit: Payer: Self-pay | Admitting: Family Medicine

## 2010-05-28 ENCOUNTER — Encounter: Payer: Self-pay | Admitting: Family Medicine

## 2010-06-12 ENCOUNTER — Encounter: Payer: Self-pay | Admitting: Family Medicine

## 2010-06-13 ENCOUNTER — Encounter: Payer: Self-pay | Admitting: Family Medicine

## 2010-06-18 ENCOUNTER — Ambulatory Visit: Payer: Self-pay | Admitting: Family Medicine

## 2010-06-19 ENCOUNTER — Encounter: Payer: Self-pay | Admitting: Family Medicine

## 2010-06-26 ENCOUNTER — Encounter: Payer: Self-pay | Admitting: Family Medicine

## 2010-07-25 ENCOUNTER — Telehealth: Payer: Self-pay | Admitting: Family Medicine

## 2010-08-13 ENCOUNTER — Telehealth: Payer: Self-pay | Admitting: Family Medicine

## 2010-08-14 ENCOUNTER — Encounter: Payer: Self-pay | Admitting: Family Medicine

## 2010-08-15 ENCOUNTER — Ambulatory Visit: Payer: Self-pay | Admitting: Family Medicine

## 2010-08-15 ENCOUNTER — Telehealth: Payer: Self-pay | Admitting: Family Medicine

## 2010-08-15 DIAGNOSIS — E079 Disorder of thyroid, unspecified: Secondary | ICD-10-CM | POA: Insufficient documentation

## 2010-08-20 ENCOUNTER — Encounter: Payer: Self-pay | Admitting: Family Medicine

## 2010-08-21 ENCOUNTER — Encounter: Payer: Self-pay | Admitting: Family Medicine

## 2010-08-21 LAB — CONVERTED CEMR LAB
Hep A IgM: NEGATIVE
Hep B C IgM: NEGATIVE

## 2010-08-23 ENCOUNTER — Telehealth: Payer: Self-pay | Admitting: Family Medicine

## 2010-08-23 LAB — CONVERTED CEMR LAB
BUN: 12 mg/dL (ref 6–23)
Bilirubin, Direct: 0.1 mg/dL (ref 0.0–0.3)
Chloride: 99 meq/L (ref 96–112)
Glucose, Bld: 97 mg/dL (ref 70–99)
Indirect Bilirubin: 0.3 mg/dL (ref 0.0–0.9)
LDL Cholesterol: 101 mg/dL — ABNORMAL HIGH (ref 0–99)
PSA: 0.55 ng/mL (ref <=4.00)
Potassium: 4.6 meq/L (ref 3.5–5.3)
Sodium: 138 meq/L (ref 135–145)
TSH: 0.276 microintl units/mL — ABNORMAL LOW (ref 0.350–4.500)
Total CHOL/HDL Ratio: 4.3
Total Protein: 7.8 g/dL (ref 6.0–8.3)
VLDL: 20 mg/dL (ref 0–40)
Vit D, 25-Hydroxy: 20 ng/mL — ABNORMAL LOW (ref 30–89)

## 2010-09-12 ENCOUNTER — Telehealth: Payer: Self-pay | Admitting: Family Medicine

## 2010-09-19 LAB — CONVERTED CEMR LAB
ALT: 26 units/L (ref 0–53)
AST: 20 units/L (ref 0–37)
Bilirubin, Direct: <0.1 mg/dL (ref 0.0–0.3)
Total Protein: 7.4 g/dL (ref 6.0–8.3)

## 2010-09-20 ENCOUNTER — Encounter: Payer: Self-pay | Admitting: Family Medicine

## 2010-09-22 ENCOUNTER — Encounter: Payer: Self-pay | Admitting: Endocrinology

## 2010-09-26 ENCOUNTER — Ambulatory Visit
Admission: RE | Admit: 2010-09-26 | Discharge: 2010-09-26 | Payer: Self-pay | Source: Home / Self Care | Attending: Family Medicine | Admitting: Family Medicine

## 2010-09-27 ENCOUNTER — Encounter: Payer: Self-pay | Admitting: Family Medicine

## 2010-10-02 NOTE — Assessment & Plan Note (Signed)
Summary: office visit   Vital Signs:  Patient profile:   45 year old male Height:      67 inches Weight:      178 pounds BMI:     27.98 O2 Sat:      100 % on Room air Pulse rate:   83 / minute Pulse rhythm:   regular Resp:     16 per minute BP sitting:   118 / 80  (right arm) Cuff size:   regular  Vitals Entered By: Mauricia Area CMA (June 18, 2010 11:14 AM)  Nutrition Counseling: Patient's BMI is greater than 25 and therefore counseled on weight management options.  O2 Flow:  Room air CC: follow up Comments Did not bring meds   Primary Care Provider:  Syliva Overman MD  CC:  follow up.  History of Present Illness: Reports  that he has been doing well.Peron accompanying him from the home where he stays has no concerns.He is now spending the weekends with his mothewr who is anxious toget him home.shecame by for the oV, states she doesnot know what is really going o with Terry Soto, and she wants some answers. No behavioral probs noted per facility staff, and he is being followed by psychiatry.  Denies recent fever or chills. Denies sinus pressure, nasal congestion , ear pain or sore throat. Denies chest congestion, or cough productive of sputum. Denies chest pain, palpitations, PND, orthopnea or leg swelling. Denies abdominal pain, nausea, vomitting, diarrhea or constipation. Denies change in bowel movements or bloody stool. Denies dysuria , frequency, incontinence or hesitancy. Denies  joint pain, swelling, or reduced mobility. Denies headaches, vertigo, seizures.  Denies  rash, lesions, or itch.     Current Medications (verified): 1)  Crestor 40 Mg  Tabs (Rosuvastatin Calcium) .... One Tab By Mouth At Bedtime 2)  Benazepril Hcl 5 Mg Tabs (Benazepril Hcl) .... Take 1 Tablet By Mouth Once A Day 3)  Lorazepam 1 Mg Tabs (Lorazepam) .... One Tab By Mouth At Bedtime 4)  Chlorpromazine Hcl 25 Mg Tabs (Chlorpromazine  Hcl) .... Take 1 Tablet By Mouth Two Times A Day 5)  Vitamin D .... 1 Time A Week  Allergies (verified): No Known Drug Allergies  Review of Systems      See HPI Eyes:  Denies blurring and discharge. Psych:  Complains of mental problems; denies suicidal thoughts/plans, thoughts of violence, and unusual visions or sounds. Endo:  Denies cold intolerance, excessive hunger, excessive thirst, and heat intolerance. Heme:  Denies abnormal bruising and bleeding. Allergy:  Denies hives or rash, itching eyes, and persistent infections.  Physical Exam  General:  Well-developed,well-nourished,in no acute distress; alert,appropriate and cooperative throughout examination HEENT: No facial asymmetry,  EOMI, No sinus tenderness, TM's Clear, oropharynx  pink and moist.   Chest: Clear to auscultation bilaterally.  CVS: S1, S2, No murmurs, No S3.   Abd: Soft, Nontender.  MS: Adequate ROM spine, hips, shoulders and knees.  Ext: No edema.   CNS: CN 2-12 intact, power tone and sensation normal throughout.   Skin: Intact, no visible lesions or rashes.  Psych: Good eye contact, flat affect.  mentally retarded, not anxious or depressed appearing.    Impression & Recommendations:  Problem # 1:  DYSARTHRIA (ICD-784.51) Assessment Unchanged  Problem # 2:  MENTAL RETARDATION (ICD-319) Assessment: Unchanged  Problem # 3:  UNSPECIFIED PSYCHOSIS (ICD-298.9) Assessment: Improved  Problem # 4:  HYPERTENSION (ICD-401.9) Assessment:  Unchanged  His updated medication list for this problem includes:    Benazepril Hcl 5 Mg Tabs (Benazepril hcl) .Marland Kitchen... Take 1 tablet by mouth once a day  Orders: T-Basic Metabolic Panel ([id number])  BP today: 118/80 Prior BP: 128/82 (04/18/2010)  Labs Reviewed: K+: 4.2 (12/26/2009) Creat: : 1.02 (12/26/2009)   Chol: 171 (10/12/2009)   HDL: 44 (10/12/2009)   LDL: 103 (10/12/2009)   TG: 119 (10/12/2009)  Problem # 5:  HYPERLIPIDEMIA (ICD-272.4) Assessment: Comment  Only  His updated medication list for this problem includes:    Crestor 40 Mg Tabs (Rosuvastatin calcium) ..... One tab by mouth at bedtime  Orders: T-Lipid Profile [id number]) T-Hepatic Function [id number])  Labs Reviewed: SGOT: 23 (09/26/2009)   SGPT: 26 (09/26/2009)   HDL:44 (10/12/2009), 41 (12/21/2008)  LDL:103 (10/12/2009), 103 (12/21/2008)  Chol:171 (10/12/2009), 167 (12/21/2008)  Trig:119 (10/12/2009), 116 (12/21/2008)  Complete Medication List: 1)  Crestor 40 Mg Tabs (Rosuvastatin calcium) .... One tab by mouth at bedtime 2)  Benazepril Hcl 5 Mg Tabs (Benazepril hcl) .... Take 1 tablet by mouth once a day 3)  Lorazepam 1 Mg Tabs (Lorazepam) .... One tab by mouth at bedtime 4)  Chlorpromazine Hcl 25 Mg Tabs (Chlorpromazine hcl) .... Take 1 tablet by mouth two times a day 5)  Vitamin D  .... 1 time a week 6)  Risperdal 0.5 Mg Tabs (Risperidone) .... One tablet in the morning and two tablets at bedtime  Other Orders: T-TSH [id number]) T-Vitamin D (25-Hydroxy) [phone]) Influenza Vaccine MCR [phone])  Patient Instructions: 1)  Please schedule a follow-up appointment in 4 months.Terry Soto wants to know appt dates and times also. 2)  BMP prior to visit, ICD-9: 3)  Hepatic Panel prior to visit, ICD-9: fasting asap 4)  Lipid Panel prior to visit, ICD-9: 5)  TSH 6)  Meds as listed 7)  Flu vaccine administered today,  8)  we will call when forms are completed   Orders Added: 1)  Est. Patient Level IV [09983] 2)  T-Basic Metabolic Panel [[id number]] 3)  T-Lipid Profile [[id number]] 4)  T-Hepatic Function [[id number]] 5)  T-TSH [[id number]] 6)  T-Vitamin D (25-Hydroxy) [[id number]] 7)  Influenza Vaccine MCR [00025]   Immunizations Administered:  Influenza Vaccine # 1:    Vaccine Type: Fluvax MCR    Site: right deltoid    Mfr: novartis    Dose: 0.5 ml    Route: IM    Given by: Adella Hare LPN    Exp. Date: 01/2011    Lot #: [id number]    VIS  given: 03/27/10 version given June 18, 2010.   Immunizations Administered:  Influenza Vaccine # 1:    Vaccine Type: Fluvax MCR    Site: right deltoid    Mfr: novartis    Dose: 0.5 ml    Route: IM    Given by: Adella Hare LPN    Exp. Date: 01/2011    Lot #: [id number]    VIS given: 03/27/10 version given June 18, 2010.

## 2010-10-02 NOTE — Letter (Signed)
Summary: release of medical info  release of medical info   Imported By: Lind Guest 06/19/2010 07:49:44  _____________________________________________________________________  External Attachment:    Type:   Image     Comment:   External Document

## 2010-10-02 NOTE — Assessment & Plan Note (Signed)
Summary: recert   Allergies: No Known Drug Allergies   Complete Medication List: 1)  Alprazolam 1 Mg Tabs (Alprazolam) .... One tab by mouth three times a day 2)  Klor-con M10 10 Meq Cr-tabs (Potassium chloride crys cr) .... One tab by mouth once daily 3)  Crestor 40 Mg Tabs (Rosuvastatin calcium) .... One tab by mouth at bedtime 4)  Omeprazole 20 Mg Tbec (Omeprazole) .... Take one tab by mouth once daily 5)  Refresh Liquid Tears  .... Uad for dry eyes 6)  Benztropine Mesylate 1 Mg Tabs (Benztropine mesylate) .... Take 1 tab by mouth at bedtime 7)  Vitamin D (ergocalciferol) 50000 Unit Caps (Ergocalciferol) .... One cap twice weekly 8)  Seroquel 100 Mg Tabs (Quetiapine fumarate) .Marland Kitchen.. 1 tab in am and 3 at bedtime 9)  Hydrochlorothiazide 25 Mg Tabs (Hydrochlorothiazide) .... Take 1 tablet by mouth once a day 10)  Klor-con M20 20 Meq Cr-tabs (Potassium chloride crys cr) .... Take 1 tablet by mouth once a day  Other Orders: Re-certification of Home Health [phone])

## 2010-10-02 NOTE — Miscellaneous (Signed)
[facility]: [facility]   Imported By: Lind Guest 04/20/2010 14:55:40  _____________________________________________________________________  External Attachment:    Type:   Image     Comment:   External Document

## 2010-10-02 NOTE — Letter (Signed)
Summary: FROM DR.CLARK  FROM DR.CLARK   Imported By: Lind Guest 03/12/2010 14:00:14  _____________________________________________________________________  External Attachment:    Type:   Image     Comment:   External Document
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
## 2010-10-02 NOTE — Progress Notes (Signed)
Summary: ov   Phone Note Call from Patient   Caller: home health nurse? Summary of Call: went to see patient today and bp 140/100 and hr 120 Initial call taken by: Adella Hare LPN,  Jan 19, 2010 12:02 PM  Follow-up for Phone Call        pls give ov next week with pa re-eval bp and heart rate Follow-up by: Syliva Overman MD,  Jan 19, 2010 12:26 PM  Additional Follow-up for Phone Call Additional follow up Details #1::        pt has appt at 01/23/2010 2:45. pt mom was notified  Additional Follow-up by: Rudene Anda,  Jan 19, 2010 2:34 PM

## 2010-10-02 NOTE — Progress Notes (Signed)
Summary: needs to speak with doc  Phone Note Call from Patient   Summary of Call: mother says she needs to speak with doc about son. they won't change his meds. won't go against another doc. needs some help  [phone] Initial call taken by: Rudene Anda,  February 07, 2010 9:01 AM  Follow-up for Phone Call        duplicate message Follow-up by: Adella Hare LPN,  February 08, 980 3:36 PM

## 2010-10-02 NOTE — Miscellaneous (Signed)
Summary: Home Care Report  Home Care Report   Imported By: Lind Guest 10/06/2009 15:53:21  _____________________________________________________________________  External Attachment:    Type:   Image     Comment:   External Document

## 2010-10-02 NOTE — Miscellaneous (Signed)
Summary: Home Care Report  Home Care Report   Imported By: Lind Guest 06/13/2010 11:41:04  _____________________________________________________________________  External Attachment:    Type:   Image     Comment:   External Document

## 2010-10-02 NOTE — Progress Notes (Signed)
Summary: NEEDS CORRECT #  Phone Note Call from Patient   Summary of Call: NEEDS CORRECT INFO FOR THE APPT. FOR [facility]. ANGELA IS WHO HE IS LIVING WITH PLEASE CALL BACK AT [phone] TO GIVE HER THE #   Initial call taken by: Lind Guest,  March 08, 2010 9:24 AM  Follow-up for Phone Call        returned call, left message Follow-up by: Adella Hare LPN,  March 08, 9561 4:48 PM  Additional Follow-up for Phone Call Additional follow up Details #1::        spoke with patients mother, she called and gave needed info and got patient an appt for next week at [facility] Additional Follow-up by: Adella Hare LPN,  March 10, 1307 2:12 PM

## 2010-10-02 NOTE — Letter (Signed)
Summary: Letter to dr. Ave Filter  Letter to dr. Ave Filter   Imported By: Lind Guest 07/20/2010 14:48:33  _____________________________________________________________________  External Attachment:    Type:   Image     Comment:   External Document

## 2010-10-02 NOTE — Miscellaneous (Signed)
Summary: FL 2  [place] 2   Imported By: Lind Guest 06/26/2010 13:33:45  _____________________________________________________________________  External Attachment:    Type:   Image     Comment:   External Document
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
## 2010-10-02 NOTE — Miscellaneous (Signed)
Summary: med update  Clinical Lists Changes  Medications: Removed medication of VITAMIN D (ERGOCALCIFEROL) 50000 UNIT CAPS (ERGOCALCIFEROL) one cap twice weekly

## 2010-10-02 NOTE — Miscellaneous (Signed)
Summary: Home Care Report  Home Care Report   Imported By: Lind Guest 10/27/2009 09:50:28  _____________________________________________________________________  External Attachment:    Type:   Image     Comment:   External Document

## 2010-10-02 NOTE — Progress Notes (Signed)
Summary: speak with doc  Phone Note Call from Patient   Summary of Call: mother needs to get pt eval. by someone before he comes home this week. does doc know anybody. [phone]  Initial call taken by: Rudene Anda,  February 12, 2010 4:08 PM  Follow-up for Phone Call        let her know I donot and i recommend strongly she uses the mental health providers, counsellors and psych recommended by the hos[pital, so she needs to talk to the stafff treating him at the hosp and get this sorted out Follow-up by: Syliva Overman MD,  February 12, 2010 5:32 PM  Additional Follow-up for Phone Call Additional follow up Details #1::        patient is currently is in hospital, mother goes to court to get guardianship of patient today, is still looking for placement in a home temporarily, or come home Additional Follow-up by: Adella Hare LPN,  February 13, 2010 4:53 PM

## 2010-10-02 NOTE — Progress Notes (Signed)
Summary: medicine  Phone Note Call from Patient   Summary of Call: calling about a rx that should have been called in for Pio Fahr.   Marylene Land  [phone]  Initial call taken by: Rudene Anda,  March 13, 2010 2:23 PM  Follow-up for Phone Call        returned call, left message Follow-up by: Adella Hare LPN,  March 13, 2010 3:29 PM  Additional Follow-up for Phone Call Additional follow up Details #1::        returned call, left message Additional Follow-up by: Adella Hare LPN,  March 14, 2010 10:38 AM

## 2010-10-02 NOTE — Miscellaneous (Signed)
Summary: Home Care Report  Home Care Report   Imported By: Lind Guest 09/27/2009 09:38:44  _____________________________________________________________________  External Attachment:    Type:   Image     Comment:   External Document

## 2010-10-02 NOTE — Miscellaneous (Signed)
Summary: psychiatry appt  Patient has appt with Dr. Lolly Mustache at [facility] outpatient center Jun 06 2010 at 8:30am They will mail information to patient's mother Patient's caretaker aware Adella Hare LPN  March 06, 453 11:48 AM

## 2010-10-02 NOTE — Letter (Signed)
Summary: guardian of patient  guardian of patient   Imported By: Lind Guest 06/19/2010 07:49:16  _____________________________________________________________________  External Attachment:    Type:   Image     Comment:   External Document

## 2010-10-02 NOTE — Progress Notes (Signed)
Summary: DR. Margaretmary Bayley  DR. PRESTON CLARK   Imported By: Lind Guest 01/23/2010 16:03:47  _____________________________________________________________________  External Attachment:    Type:   Image     Comment:   External Document

## 2010-10-02 NOTE — Miscellaneous (Signed)
Summary: Home Care Report  Home Care Report   Imported By: Lind Guest 01/25/2010 08:06:33  _____________________________________________________________________  External Attachment:    Type:   Image     Comment:   External Document

## 2010-10-02 NOTE — Assessment & Plan Note (Signed)
Summary: office visit   Vital Signs:  Patient profile:   45 year old male Height:      67 inches Weight:      178.75 pounds BMI:     28.10 Pulse rate:   82 / minute Pulse rhythm:   regular Resp:     16 per minute BP sitting:   120 / 82  (left arm)  Vitals Entered By: Worthy Keeler LPN (October 11, 2009 11:04 AM)  Nutrition Counseling: Patient's BMI is greater than 25 and therefore counseled on weight management options. CC: follow-up visit Is Patient Diabetic? No Pain Assessment Patient in pain? no        Primary Care Provider:  Syliva Overman MD  CC:  follow-up visit.  History of Present Illness: Reports  that he is doing well. Denies recent fever or chills. Denies sinus pressure, nasal congestion , ear pain or sore throat. Denies chest congestion, or cough productive of sputum. Denies chest pain, palpitations, PND, orthopnea or leg swelling. Denies abdominal pain, nausea, vomitting, diarrhea or constipation. Denies change in bowel movements or bloody stool. Denies dysuria , frequency, incontinence or hesitancy. Denies  joint pain, swelling, or reduced mobility. Denies headaches, vertigo, seizures. he did have behavioral issues and has been back on mind altering meds for the past 4 to5 weeks.mother is very concerned about the possibility of developing Denies  rash, lesions, or itch.     Current Medications (verified): 1)  Alprazolam 1 Mg  Tabs (Alprazolam) .... One Tab By Mouth Three Times A Day 2)  Hydrochlorothiazide 12.5 Mg  Tabs (Hydrochlorothiazide) .... One Tab By Mouth Once Daily 3)  Klor-Con M10 10 Meq  Cr-Tabs (Potassium Chloride Crys Cr) .... One Tab By Mouth Once Daily 4)  Crestor 40 Mg  Tabs (Rosuvastatin Calcium) .... One Tab By Mouth At Bedtime 5)  Omeprazole 20 Mg Tbec (Omeprazole) .... Take One Tab By Mouth Once Daily 6)  Refresh Liquid Tears .... Uad For Dry Eyes  Allergies (verified): No Known Drug Allergies  Review of Systems      See  HPI Eyes:  Denies blurring and discharge. Heme:  Denies abnormal bruising and bleeding. Allergy:  Denies hives or rash.  Physical Exam  General:  alert, well-hydrated, and overweight-appearing. HEENT: No facial asymmetry,  EOMI, No sinus tenderness, TM's Clear, oropharynx  pink and moist.   Chest: Clear to auscultation bilaterally.  CVS: S1, S2, No murmurs, No S3.   Abd: Soft, Nontender.  MS: Adequate ROM spine, hips, shoulders and knees.  Ext: No edema.   CNS: CN 2-12 intact, power tone and sensation normal throughout.   Skin: Intact, no visible lesions or rashes.  Psych: Good eye contact, flat affect.  Memory loss, not anxious or depressed appearing.      Impression & Recommendations:  Problem # 1:  HYPERCALCEMIA (ICD-275.42) Assessment Comment Only  Orders: T-Parathyroid Hormone, Intact [phone]) T-Vitamin D (25-Hydroxy) ([id number])  Problem # 2:  BACK PAIN, CHRONIC (ICD-724.5) Assessment: Improved  Problem # 3:  HYPERTENSION (ICD-401.9) Assessment: Unchanged  His updated medication list for this problem includes:    Hydrochlorothiazide 12.5 Mg Tabs (Hydrochlorothiazide) ..... One tab by mouth once daily  Orders: T-Basic Metabolic Panel ([id number])  BP today: 120/82 Prior BP: 120/90 (06/07/2009)  Labs Reviewed: K+: 4.3 (09/26/2009) Creat: : 1.04 (09/26/2009)   Chol: 167 (12/21/2008)   HDL: 41 (12/21/2008)   LDL: 103 (12/21/2008)   TG: 116 (12/21/2008)  Problem # 4:  HYPERLIPIDEMIA (ICD-272.4) Assessment: Comment Only  His updated medication list for this problem includes:    Crestor 40 Mg Tabs (Rosuvastatin calcium) ..... One tab by mouth at bedtime  Orders: T-Lipid Profile [phone]) T-Hepatic Function [phone]) T-Lipid Profile ([phone])  Labs Reviewed: SGOT: 23 (09/26/2009)   SGPT: 26 (09/26/2009)   HDL:41 (12/21/2008), 47 (06/13/2008)  LDL:103 (12/21/2008), 88 (76/16/0737)  Chol:167 (12/21/2008), 154 (06/13/2008)  Trig:116  (12/21/2008), 97 (06/13/2008)  Complete Medication List: 1)  Alprazolam 1 Mg Tabs (Alprazolam) .... One tab by mouth three times a day 2)  Hydrochlorothiazide 12.5 Mg Tabs (Hydrochlorothiazide) .... One tab by mouth once daily 3)  Klor-con M10 10 Meq Cr-tabs (Potassium chloride crys cr) .... One tab by mouth once daily 4)  Crestor 40 Mg Tabs (Rosuvastatin calcium) .... One tab by mouth at bedtime 5)  Omeprazole 20 Mg Tbec (Omeprazole) .... Take one tab by mouth once daily 6)  Refresh Liquid Tears  .... Uad for dry eyes  Other Orders: T-CBC w/Diff ([id number]) T-CBC w/Diff ([id number])  Patient Instructions: 1)  Please schedule a follow-up appointment in 3.28months. 2)  Lipid Panel prior to visit, ICD-9: 3)  CBC w/ Diff prior to visit, ICD-9: 4)  PTH and vit D level asap fasting. 5)  Congrats on weight loss  keep it up

## 2010-10-02 NOTE — Progress Notes (Signed)
Summary: appt with a psy  Phone Note Call from Patient   Summary of Call: WANTS TO [facility] HOME [place] AND WANTS HIM TO SEE THE PHY.AT [address] [place] [facility]  call back at [phone] Initial call taken by: Lind Guest,  July 25, 2010 9:42 AM  Follow-up for Phone Call        pls let  mother know that transfer of care to a new  [psychiatrist will need to be through the current one , i can't just change like that , he needs continuity of care, It is fine for him to see someone new but needs continuity, she needs to discuss with current psych Follow-up by: Syliva Overman MD,  July 25, 2010 12:48 PM  Additional Follow-up for Phone Call Additional follow up Details #1::        he is coming home and he has to go to [place] and it is hard to travel back and forth and she knows he will not do this so I gave her the # to the place  [address] Additional Follow-up by: Lind Guest,  July 25, 2010 4:17 PM    Additional Follow-up for Phone Call Additional follow up Details #2::    noted Follow-up by: Syliva Overman MD,  July 30, 2010 12:48 PM

## 2010-10-02 NOTE — Miscellaneous (Signed)
Summary: med update  Clinical Lists Changes  Medications: Removed medication of TESSALON PERLES 100 MG CAPS (BENZONATATE) Take 1 capsule by mouth three times a day Removed medication of HALOPERIDOL 2 MG TABS (HALOPERIDOL) one tab by mouth at bedtime Removed medication of BENZTROPINE MESYLATE 1 MG TABS (BENZTROPINE MESYLATE) Take 1 tab by mouth at bedtime Added new medication of CHLORPROMAZINE HCL 25 MG TABS (CHLORPROMAZINE HCL) Take 1 tablet by mouth two times a day Added new medication of RISPERIDONE 0.5 MG TABS (RISPERIDONE) Take 1 tablet by mouth two times a day
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
## 2010-10-02 NOTE — Assessment & Plan Note (Signed)
Summary: RECERT   Allergies: No Known Drug Allergies   Complete Medication List: 1)  Alprazolam 1 Mg Tabs (Alprazolam) .... One tab by mouth three times a day 2)  Hydrochlorothiazide 12.5 Mg Tabs (Hydrochlorothiazide) .... One tab by mouth once daily 3)  Klor-con M10 10 Meq Cr-tabs (Potassium chloride crys cr) .... One tab by mouth once daily 4)  Crestor 40 Mg Tabs (Rosuvastatin calcium) .... One tab by mouth at bedtime 5)  Omeprazole 20 Mg Tbec (Omeprazole) .... Take one tab by mouth once daily 6)  Refresh Liquid Tears  .... Uad for dry eyes recertification form reviewed and signed
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
## 2010-10-02 NOTE — Progress Notes (Signed)
Summary: APPT. DR. Chestine Spore  Phone Note Call from Patient   Summary of Call: called patient to confirm TO LET HIM KNOW ABOUT HIS APPOINMENT WITH DR. PRESTON CLARK  FEB 28 @ 11:00  TALKED WITH MOTHER AND TOLD HER THE DAY AND TIME AND FOR HER TO CALL BACK OUT THERE TO CONFIRM THE APPT. SHE WAS GOING TO WHEN WE HUNG UP Initial call taken by: Lind Guest,  October 16, 2009 1:35 PM  Follow-up for Phone Call        pls verify that she clearly has trhe referral info, from the msg I am not quite sure that she does, if she does no need to call back or send back another msg Follow-up by: Syliva Overman MD,  October 16, 2009 5:06 PM  Additional Follow-up for Phone Call Additional follow up Details #1::        I SPOKE WITH HER AND EXPLAINED EVERYTHING AND GAVE HER  the phone # and everything Additional Follow-up by: Lind Guest,  October 17, 2009 8:41 AM    Additional Follow-up for Phone Call Additional follow up Details #2::    thanks Follow-up by: Syliva Overman MD,  October 17, 2009 11:57 AM

## 2010-10-02 NOTE — Assessment & Plan Note (Signed)
Summary: recert   Allergies: No Known Drug Allergies   Complete Medication List: 1)  Alprazolam 1 Mg Tabs (Alprazolam) .... One tab by mouth three times a day 2)  Hydrochlorothiazide 12.5 Mg Tabs (Hydrochlorothiazide) .... One tab by mouth once daily 3)  Klor-con M10 10 Meq Cr-tabs (Potassium chloride crys cr) .... One tab by mouth once daily 4)  Crestor 40 Mg Tabs (Rosuvastatin calcium) .... One tab by mouth at bedtime 5)  Omeprazole 20 Mg Tbec (Omeprazole) .... Take one tab by mouth once daily 6)  Refresh Liquid Tears  .... Uad for dry eyes recert papers reviewed  and signed off

## 2010-10-02 NOTE — Miscellaneous (Signed)
Summary: Home Care Report  Home Care Report   Imported By: Lind Guest 11/27/2009 10:59:06  _____________________________________________________________________  External Attachment:    Type:   Image     Comment:   External Document

## 2010-10-02 NOTE — Letter (Signed)
Summary: medical release  medical release   Imported By: Lind Guest 03/02/2010 14:39:27  _____________________________________________________________________  External Attachment:    Type:   Image     Comment:   External Document

## 2010-10-02 NOTE — Assessment & Plan Note (Signed)
Summary: RECERT   Allergies: No Known Drug Allergies   Complete Medication List: 1)  Alprazolam 1 Mg Tabs (Alprazolam) .... One tab by mouth three times a day 2)  Hydrochlorothiazide 12.5 Mg Tabs (Hydrochlorothiazide) .... One tab by mouth once daily 3)  Klor-con M10 10 Meq Cr-tabs (Potassium chloride crys cr) .... One tab by mouth once daily 4)  Crestor 40 Mg Tabs (Rosuvastatin calcium) .... One tab by mouth at bedtime 5)  Omeprazole 20 Mg Tbec (Omeprazole) .... Take one tab by mouth once daily 6)  Refresh Liquid Tears  .... Uad for dry eyes papers reviewed and signed off for this "visit " already

## 2010-10-04 NOTE — Letter (Signed)
Summary: lab add on  lab add on   Imported By: Luann Bullins 09/20/2010 14:40:38  _____________________________________________________________________  External Attachment:    Type:   Image     Comment:   External Document

## 2010-10-04 NOTE — Letter (Signed)
Summary: CONSULTS  CONSULTS   Imported By: Lind Guest 08/16/2010 09:17:25  _____________________________________________________________________  External Attachment:    Type:   Image     Comment:   External Document

## 2010-10-04 NOTE — Progress Notes (Signed)
Summary: Kayceon coming home  Phone Note Call from Patient   Summary of Call: CARRIE  IS BRINGING  Irl HOME TOMORROW  wants to know does she need a reffer. for him to go see dr next door since he is going to [place] Initial call taken by: Lind Guest,  August 13, 2010 5:11 PM  Follow-up for Phone Call        I am unclear, if he is going to [place], then I assume he will get psych in [place], if he is staying in the area, and if hje has medicare, dr arfeen can see him, if not then [facility] or [facility] to [facility], pls let me know so i can put in the appropriate referal Follow-up by: Syliva Overman MD,  August 14, 2010 4:50 AM  Additional Follow-up for Phone Call Additional follow up Details #1::        he is going to [facility] for psych care and he has medicaid and medicare  he is coming home to stay with carrie yes he will be in the area he was only going to [facility]. because the home he stayed at that was were where they send him so carrie wants him to go next door  Additional Follow-up by: Lind Guest,  August 14, 2010 8:52 AM    Additional Follow-up for Phone Call Additional follow up Details #2::    I will put in a referral for Behav health , pls try and sched an appt and let Lyla Son know. Tell her I know she is glad he is coming home and I hope things go wel Follow-up by: Syliva Overman MD,  August 14, 2010 12:41 PM  Additional Follow-up for Phone Call Additional follow up Details #3:: Details for Additional Follow-up Action Taken: patient is aware I have faxed over info to Dr. Robert Bellow office Additional Follow-up by: Lind Guest,  August 15, 2010 8:37 AM

## 2010-10-04 NOTE — Letter (Signed)
Summary: DEMO  DEMO   Imported By: Lind Guest 08/14/2010 14:18:54  _____________________________________________________________________  External Attachment:    Type:   Image     Comment:   External Document

## 2010-10-04 NOTE — Letter (Signed)
Summary: LABS  LABS   Imported By: Lind Guest 08/16/2010 09:18:05  _____________________________________________________________________  External Attachment:    Type:   Image     Comment:   External Document

## 2010-10-04 NOTE — Letter (Signed)
Summary: office notes  office notes   Imported By: Lind Guest 08/15/2010 14:13:54  _____________________________________________________________________  External Attachment:    Type:   Image     Comment:   External Document

## 2010-10-04 NOTE — Miscellaneous (Signed)
  Clinical Lists Changes  Orders: Added new Referral order of Psychiatric Referral (Psych) - Signed

## 2010-10-04 NOTE — Miscellaneous (Signed)
Summary: fl 2 paper  fl 2 paper   Imported By: Lind Guest 08/20/2010 09:36:58  _____________________________________________________________________  External Attachment:    Type:   Image     Comment:   External Document

## 2010-10-04 NOTE — Letter (Signed)
Summary: Letter  Letter   Imported By: Lind Guest 08/22/2010 08:10:52  _____________________________________________________________________  External Attachment:    Type:   Image     Comment:   External Document

## 2010-10-04 NOTE — Letter (Signed)
Summary: PHONE NOTES  PHONE NOTES   Imported By: Lind Guest 08/16/2010 09:20:44  _____________________________________________________________________  External Attachment:    Type:   Image     Comment:   External Document

## 2010-10-04 NOTE — Letter (Signed)
Summary: dose increase  dose increase   Imported By: Lind Guest 09/27/2010 16:00:27  _____________________________________________________________________  External Attachment:    Type:   Image     Comment:   External Document
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
## 2010-10-04 NOTE — Progress Notes (Signed)
Summary: papers  Phone Note Call from Patient   Summary of Call: carrie called and wanted to know are the papers ready that she brought for you to fill out at his last office visit to get back on cap program Initial call taken by: Lind Guest,  September 12, 2010 2:04 PM  Follow-up for Phone Call        a cAP from 12/19 is already scanned in Follow-up by: Syliva Overman MD,  September 13, 2010 5:20 AM  Additional Follow-up for Phone Call Additional follow up Details #1::        called mother, left message Additional Follow-up by: Adella Hare LPN,  September 17, 2010 9:03 AM    Additional Follow-up for Phone Call Additional follow up Details #2::    called mother, someone picked up the phone and hung up Follow-up by: Adella Hare LPN,  September 18, 2010 9:56 AM  Additional Follow-up for Phone Call Additional follow up Details #3:: Details for Additional Follow-up Action Taken: advised front staff if mother calls back to let her know papers are available for pick up Additional Follow-up by: Adella Hare LPN,  September 18, 2010 9:57 AM

## 2010-10-04 NOTE — Letter (Signed)
Summary: X RAYS  X RAYS   Imported By: Lind Guest 08/16/2010 09:22:46  _____________________________________________________________________  External Attachment:    Type:   Image     Comment:   External Document

## 2010-10-04 NOTE — Letter (Signed)
Summary: Historic Patient File  Historic Patient File   Imported By: Lind Guest 08/14/2010 13:36:09  _____________________________________________________________________  External Attachment:    Type:   Image     Comment:   External Document

## 2010-10-04 NOTE — Progress Notes (Signed)
Summary: behavioal health / [facility]  Phone Note Call from Patient   Summary of Call: peggy fronm next door called and said that he needed a higher level of care like [facility]  [facility] can not do anything SO I called [facility] and they said that the patient would have to call and they gave me a # to give to him so I gave it Lyla Son to get this set up  Initial call taken by: Lind Guest,  August 15, 2010 2:59 PM  Follow-up for Phone Call        mother aware pt will need to go to [facility], [facility]  [facility] has refused Follow-up by: Syliva Overman MD,  August 15, 2010 3:08 PM  Additional Follow-up for Phone Call Additional follow up Details #1::        yes i gave her the # to [facility] because i called while they were sittting in the lobby and [facility] would not let make the appt. she gave me a # to give to patient he has to make his own and I explained this to [facility] and gave her the # before you even seen him Additional Follow-up by: Lind Guest,  August 16, 2010 5:02 PM    Additional Follow-up for Phone Call Additional follow up Details #2::    noted Follow-up by: Syliva Overman MD,  August 16, 2010 5:11 PM

## 2010-10-04 NOTE — Letter (Signed)
Summary: LAB ADD ON  LAB ADD ON   Imported By: Lind Guest 08/21/2010 13:29:52  _____________________________________________________________________  External Attachment:    Type:   Image     Comment:   External Document

## 2010-10-04 NOTE — Letter (Signed)
Summary: MISC  MISC   Imported By: Lind Guest 08/16/2010 09:21:24  _____________________________________________________________________  External Attachment:    Type:   Image     Comment:   External Document

## 2010-10-04 NOTE — Progress Notes (Signed)
Summary: LETTER RECV  Phone Note Call from Patient   Summary of Call: PATIENT MOTHER CALLED STATES THAT RECV A LETTER  PLEASE CALL- [phone] IF NOT AT HOME LEAVE A MESSAGE Initial call taken by: Eugenio Hoes,  August 23, 2010 11:27 AM  Follow-up for Phone Call        tried to return call, wrong # entered in chart Follow-up by: Adella Hare LPN,  August 23, 2010 2:40 PM

## 2010-10-11 ENCOUNTER — Encounter: Payer: Self-pay | Admitting: Family Medicine

## 2010-10-18 NOTE — Letter (Signed)
Summary: cap program  cap program   Imported By: Lind Guest 10/11/2010 14:36:31  _____________________________________________________________________  External Attachment:    Type:   Image     Comment:   External Document
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
## 2010-11-01 ENCOUNTER — Telehealth: Payer: Self-pay | Admitting: Family Medicine
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
## 2010-11-17 LAB — CBC
HCT: 50.8 % (ref 39.0–52.0)
Hemoglobin: 17 g/dL (ref 13.0–17.0)
RDW: 14.7 % (ref 11.5–15.5)
WBC: 8.9 10*3/uL (ref 4.0–10.5)

## 2010-11-17 LAB — URINALYSIS, ROUTINE W REFLEX MICROSCOPIC
Glucose, UA: NEGATIVE mg/dL
Ketones, ur: NEGATIVE mg/dL
Protein, ur: NEGATIVE mg/dL
Urobilinogen, UA: 0.2 mg/dL (ref 0.0–1.0)

## 2010-11-17 LAB — BASIC METABOLIC PANEL
CO2: 29 mEq/L (ref 19–32)
Chloride: 100 mEq/L (ref 96–112)
GFR calc Af Amer: >60 mL/min (ref >60)
Potassium: 3.9 mEq/L (ref 3.5–5.1)
Sodium: 136 mEq/L (ref 135–145)

## 2010-11-17 LAB — DIFFERENTIAL
Basophils Absolute: 0.1 10*3/uL (ref 0.0–0.1)
Lymphocytes Relative: 17 % (ref 12–46)
Monocytes Absolute: 0.9 10*3/uL (ref 0.1–1.0)
Neutro Abs: 6.1 10*3/uL (ref 1.7–7.7)
Neutrophils Relative %: 68 % (ref 43–77)

## 2010-11-17 LAB — ETHANOL: Alcohol, Ethyl (B): <5 mg/dL (ref 0–10)

## 2010-11-17 LAB — URINE MICROSCOPIC-ADD ON

## 2010-11-19 LAB — BASIC METABOLIC PANEL
Calcium: 10.2 mg/dL (ref 8.4–10.5)
GFR calc Af Amer: >60 mL/min (ref >60)
GFR calc non Af Amer: >60 mL/min (ref >60)
Glucose, Bld: 94 mg/dL (ref 70–99)
Potassium: 3.8 mEq/L (ref 3.5–5.1)
Sodium: 133 mEq/L — ABNORMAL LOW (ref 135–145)

## 2010-11-19 LAB — RAPID URINE DRUG SCREEN, HOSP PERFORMED
Amphetamines: NOT DETECTED
Benzodiazepines: NOT DETECTED
Cocaine: NOT DETECTED

## 2010-11-19 LAB — DIFFERENTIAL
Eosinophils Relative: 2 % (ref 0–5)
Lymphocytes Relative: 12 % (ref 12–46)
Lymphs Abs: 1.2 10*3/uL (ref 0.7–4.0)
Monocytes Absolute: 0.7 10*3/uL (ref 0.1–1.0)
Monocytes Relative: 7 % (ref 3–12)
Neutro Abs: 7.6 10*3/uL (ref 1.7–7.7)

## 2010-11-19 LAB — CBC
HCT: 43.6 % (ref 39.0–52.0)
Hemoglobin: 14.9 g/dL (ref 13.0–17.0)
RBC: 4.76 MIL/uL (ref 4.22–5.81)
RDW: 14.1 % (ref 11.5–15.5)
WBC: 9.7 10*3/uL (ref 4.0–10.5)

## 2010-11-19 LAB — TSH: TSH: 0.301 u[IU]/mL — ABNORMAL LOW (ref 0.350–4.500)

## 2010-11-22 ENCOUNTER — Encounter: Payer: Self-pay | Admitting: Family Medicine

## 2010-11-23 ENCOUNTER — Encounter: Payer: Self-pay | Admitting: Family Medicine

## 2010-11-26 ENCOUNTER — Ambulatory Visit (INDEPENDENT_AMBULATORY_CARE_PROVIDER_SITE_OTHER): Payer: Medicare Other | Admitting: Family Medicine

## 2010-11-26 ENCOUNTER — Encounter: Payer: Self-pay | Admitting: Family Medicine

## 2010-11-26 VITALS — BP 122/84 | HR 76 | Resp 16 | Ht 67.0 in | Wt 180.0 lb

## 2010-11-26 DIAGNOSIS — F29 Unspecified psychosis not due to a substance or known physiological condition: Secondary | ICD-10-CM

## 2010-11-26 DIAGNOSIS — E785 Hyperlipidemia, unspecified: Secondary | ICD-10-CM

## 2010-11-26 DIAGNOSIS — I10 Essential (primary) hypertension: Secondary | ICD-10-CM

## 2010-11-26 MED ORDER — ROSUVASTATIN CALCIUM 40 MG PO TABS: 40.0000 mg | ORAL_TABLET | Freq: Every day | ORAL | Status: DC

## 2010-11-26 NOTE — Patient Instructions (Addendum)
F/U in 4 months.  Fasting labs are past due , and need to be done as soon as possible.chem 7, lipid and hepatic.  No med changes.  I am glad you are  doing well.  Pls keep appts with mental health and Dr. Kemper Durie

## 2010-12-17 NOTE — Progress Notes (Signed)
  Subjective:    Patient ID: Terry Soto, male    DOB: 03/09/1966, 45 y.o.   MRN: [id number]  HPI The PT is here for follow up and re-evaluation of chronic medical conditions, medication management and review o any f recent lab and radiology data.  Preventive health is updated, specifically  Cancer screening, and Immunization.   Questions or concerns regarding consultations or procedures which the PT has had in the interim are  addressed. The PT denies any adverse reactions to current medications since the last visit.  There are no new concerns.  There are no specific complaints       Review of Systems History is prmarily provided by mother since pt is retarded Denies recent fever or chills. Denies sinus pressure, nasal congestion, ear pain or sore throat. Denies chest congestion, productive cough or wheezing. Denies chest pains, palpitations, paroxysmal nocturnal dyspnea, orthopnea and leg swelling Denies abdominal pain, nausea, vomiting,diarrhea or constipation.  Denies rectal bleeding or change in bowel movement. Denies dysuria, frequency, hesitancy or incontinence. Denies joint pain, swelling and limitation and mobility. Denies headaches, seizure, numbness, or tingling. Denies depression, anxiety or insomnia. Denies skin break down or rash.        Objective:   Physical Exam Patient alert and oriented and in no Cardiopulmonary distress.  HEENT: No facial asymmetry, EOMI, no sinus tenderness, TM's clear, Oropharynx pink and moist.  Neck supple no adenopathy.  Chest: Clear to auscultation bilaterally.  CVS: S1, S2 no murmurs, no S3.  ABD: Soft non tender. Bowel sounds normal.  Ext: No edema  MS: Adequate ROM spine, shoulders, hips and knees.  Skin: Intact, no ulcerations or rash noted.  Psych: Good eye contact,flat affect. Memory impaired, not anxious or depressed appearing.  CNS: CN 2-12 intact, power, tone and sensation normal throughout.        Assessment &  Plan:  1.Hypertension:Controlled, no changes in medication.  2.Hyperlipidemia: continue current med, labs to be obtained, low fat diet encouraged. 3. Overweight: pt encouraged to increase activity and reduce intake

## 2011-01-13 ENCOUNTER — Other Ambulatory Visit: Payer: Self-pay | Admitting: Family Medicine

## 2011-01-18 NOTE — H&P (Signed)
Terry Soto, Terry Soto                   ACCOUNT NO.:  [id number]   MEDICAL RECORD NO.:  [id number]          PATIENT TYPE:  INP   LOCATION:  A216                          FACILITY:  [facility]   PHYSICIAN:  Jerolyn Shin C. Katrinka Blazing, M.D.   DATE OF BIRTH:  1965-09-13   DATE OF ADMISSION:  07/06/2004  DATE OF DISCHARGE:  LH                                HISTORY & PHYSICAL   HISTORY:  A 45 year old, single, mentally retarded male admitted for  observation for abdominal pain and closed head trauma with nausea and  vomiting.  The patient's mother, who is guardian, states that he was at home  and complained of just generalized malaise and weakness.  She states that he  fell backwards and struck his head on the table.  After that, he became more  listless and had some worsening nausea.  He was brought to the emergency  room for evaluation; and, in the emergency room he developed vomiting after  he was given oral contrast.  There is no history, actually of abdominal  pain.  This is according to the mother.  The patient had a CT of the abdomen  that only showed constipation, but no other abnormality.  He is afebrile and  does not have __________ .  He is admitted because of his recurrent nausea  and vomiting and because of his history of closed head trauma.   PAST MEDICAL HISTORY:  He has congenital mental retardation, hypertension,  gastroesophageal reflux disease, hyperlipidemia, chronic anxiety disorder,  questionable hypothyroidism.  He has been evaluated by Dr. Patrecia Pace for  thyroid disease.  The mother relates that his dizziness and malaise started  after he was given a medicine for his thyroid; however, the medication that  was filled with Dr. Gregery Na name on it is actually a generic Dyazide  37.5/25 which is an antihypertensive medication.   PREVIOUS SURGERY:  None.   MEDICATIONS:  1.  Dyazide 37.5/25 daily.  2.  Prevacid 30 mg daily.  3.  Allegra 180 mg daily.  4.  Lipitor 40 mg q.h.s.  5.   Risperdal 0.25 mg q.a.m. and 0.5 mg q.h.s.  6.  Alprazolam 0.5 mg t.i.d.  7.  BeneCor 20 mg daily.   ALLERGIES:  He has no known drug allergies.   FAMILY HISTORY:  His father is notable for hypertension only.   PHYSICAL EXAMINATION:  GENERAL:  The patient is lying quietly on the  stretcher.  He is alert and does answer some questions appropriately.  He  specifically denies any pain.  VITAL SIGNS:  On exam blood pressure is 100/60, pulse is 80, respirations  20, temperature 98.2, O2 saturation is 97% on room air.  HEENT:  Unremarkable.  I cannot detect any tenderness of his scalp or neck.  NECK:  Neck is supple, no JVD or bruit.  CHEST:  Clear to auscultation.  No rales, rubs, rhonchi or wheezes.  There  is no tenderness of his chest wall anteriorly or posteriorly.  HEART:  Regular rate and rhythm without murmur, gallop or rub.  ABDOMEN:  Soft, nontender,  no masses, very pliable, nondistended.  Specifically no epigastric, left lower quadrant, right lower quadrant or  left upper quadrant tenderness. There is no suprapubic tenderness.  EXTREMITIES:  No clubbing, cyanosis, or edema.  NEUROLOGIC:  No focal motor, sensory, or cerebellar deficit.   IMPRESSION:  1.  Probable hypotension from excess antihypertensive medications with      syncope.  2.  Closed head trauma.  3.  Nausea and vomiting related to contrast media, probably not related to      any abdominal discomfort.  4.  Prior history of hypertension.  5.  Gastroesophageal reflux disease.  6.  Mental retardation.  7.  Anxiety disorder.  8.  Questionable history of hypothyroidism.   PLAN:  The patient will be admitted and observed. He will be given  medication for nausea.  He will be given analgesic if he should develop any  pain.  It is unlikely that he has anything surgical.  If he should develop  more headache or there should be any changes in his overall neurologic  status or if he has any visual difficulties we will do a  CT of his head. The  antihypertensive medications will be withheld.     Lero   LCS/MEDQ  D:  07/07/2004  T:  07/08/2004  Job:  [id number]   cc:   Milus Mallick. Lodema Hong, M.D.  [address]  [place], [place] [address]  Fax: [phone]

## 2011-01-18 NOTE — Discharge Summary (Signed)
Terry Soto, Terry Soto                   ACCOUNT NO.:  [id number]   MEDICAL RECORD NO.:  [id number]          PATIENT TYPE:  INP   LOCATION:  A216                          FACILITY:  [facility]   PHYSICIAN:  Jerolyn Shin C. Katrinka Blazing, M.D.   DATE OF BIRTH:  September 05, 1965   DATE OF ADMISSION:  07/06/2004  DATE OF DISCHARGE:  11/07/2005LH                                 DISCHARGE SUMMARY   DISCHARGE DIAGNOSES:  1.  Hypotension due to excess antihypertensive medications with syncope.  2.  Closed head trauma.  3.  Nausea and vomiting related to contrast media.  4.  Hypertension.  5.  Gastroesophageal reflux disease.  6.  Mental retardation.  7.  Anxiety disorder.   DISPOSITION:  The patient is discharged home in stable and satisfactory  condition.  He is advised to continue his medications as before admission.  These medications were:   DISCHARGE MEDICATIONS:  1.  Diazide 7/25 mg daily.  2.  Prevacid 30 mg daily.  3.  Allegra 180 mg daily.  4.  Lipitor 40 mg q.h.s.  5.  Risperdal 0.25 mg q.a.m. and 0.5 mg q.h.s.  6.  Alprazolam 0.5 mg t.i.d.  7.  Benicar 20 mg daily.   His caretakers were advised to withhold the Diazide and Benicar until the  patient was seen by Dr. Lodema Hong.   SUBJECTIVE:  A 45 year old single mentally retarded male admitted for  observation for abdominal pain, closed head trauma with nausea and vomiting.  The patient's mother states that he was at home and complaining of  generalized malaise and weakness.  She states that he fell backwards and  struck his head on the table.  After that, he became more listless and had  nausea.  He was brought to the emergency room for evaluation and plans were  made for a CT scan.  He was given some oral contrast median and had vomiting  thereafter.  He did not have any abdominal pian.  CT scan of the abdomen  only showed constipation.  The patient was afebrile.  Because of his nausea  and vomiting, and because of his closed head trauma, he was  admitted for  observation.   PAST MEDICAL HISTORY:  1.  Congenital mental retardation.  2.  Hypertension.  3.  Gastroesophageal reflux disease.  4.  Hyperlipidemia.  5.  Chronic anxiety disorder.   His mother states that his dizziness and malaise started after he was given  a medication for his thyroid, but the medication that was filled under Dr.  Gregery Na name was a generic brand of Diazide.  It is possible that he was  taking Diazide as a thyroid medication in addition to his regular  antihypertensive medication and this caused his weakness and dizziness.  Actual etiology will probably not be known.   PHYSICAL EXAMINATION:  GENERAL:  When I evaluated the patient, the patient  was alert.  He was laying quietly on the stretcher.  VITAL SIGNS:  Blood pressure 100/60, pulse 80, respirations 20, O2  saturation 97% on room air.  HEENT:  Unremarkable.  No  evidence of any tenderness of his scalp or neck.  No lacerations.  NECK:  Unremarkable.  CHEST:  Clear to auscultation.  There was no tenderness of his chest wall.  HEART:  Normal.  ABDOMEN:  Soft, pliable, nondistended.  He did not have epigastric, left  lower quadrant, right lower quadrant, or left upper quadrant tenderness.  There was no suprapubic tenderness.  EXTREMITIES:  Unremarkable.   HOSPITAL COURSE:  The patient was admitted and observed.  He was given IV  medications for nausea.  He did not have any pain.  CT scan results of the  pelvis revealed a large amount of stool on the left side of his colon, but  otherwise was unremarkable.  He had some left-sided diverticulosis.  Upper  abdominal ultrasound was normal.   The patient gradually improved.  He had no other syncopal episodes.  His  constipation was treated and he had a very good response.  By July 09, 2004, he was stable without complaints, he was tolerating a diet, he had no  headache, no nausea or vomiting.  Blood pressure was up to 120/70, pulse was   controlled at 88.  He was discharged home with plans for followup with Dr.  Lodema Hong one week post-discharge.     Lero   LCS/MEDQ  D:  08/19/2004  T:  08/20/2004  Job:  [id number]   cc:   Milus Mallick. Lodema Hong, M.D.  [address]  [place], [place] [address]  Fax: [phone]

## 2011-03-21 LAB — BASIC METABOLIC PANEL
CO2: 27 mEq/L (ref 19–32)
Calcium: 10.5 mg/dL (ref 8.4–10.5)
Creat: 0.94 mg/dL (ref 0.50–1.35)
Sodium: 139 mEq/L (ref 135–145)

## 2011-03-21 LAB — LIPID PANEL
Cholesterol: 165 mg/dL (ref 0–200)
HDL: 41 mg/dL (ref >39)
Total CHOL/HDL Ratio: 4 Ratio

## 2011-03-26 ENCOUNTER — Encounter: Payer: Self-pay | Admitting: Family Medicine

## 2011-03-28 ENCOUNTER — Ambulatory Visit (INDEPENDENT_AMBULATORY_CARE_PROVIDER_SITE_OTHER): Payer: Medicare Other | Admitting: Family Medicine

## 2011-03-28 ENCOUNTER — Encounter: Payer: Self-pay | Admitting: Family Medicine

## 2011-03-28 VITALS — BP 130/90 | HR 105 | Resp 16 | Ht 67.0 in | Wt 186.0 lb

## 2011-03-28 DIAGNOSIS — I10 Essential (primary) hypertension: Secondary | ICD-10-CM

## 2011-03-28 DIAGNOSIS — E785 Hyperlipidemia, unspecified: Secondary | ICD-10-CM

## 2011-03-28 DIAGNOSIS — R5383 Other fatigue: Secondary | ICD-10-CM

## 2011-03-28 DIAGNOSIS — R5381 Other malaise: Secondary | ICD-10-CM

## 2011-03-28 DIAGNOSIS — Z125 Encounter for screening for malignant neoplasm of prostate: Secondary | ICD-10-CM

## 2011-03-28 DIAGNOSIS — F29 Unspecified psychosis not due to a substance or known physiological condition: Secondary | ICD-10-CM

## 2011-03-28 DIAGNOSIS — E559 Vitamin D deficiency, unspecified: Secondary | ICD-10-CM

## 2011-03-28 MED ORDER — BENAZEPRIL HCL 20 MG PO TABS: 20.0000 mg | ORAL_TABLET | Freq: Every day | ORAL | Status: DC

## 2011-03-28 NOTE — Assessment & Plan Note (Signed)
LDL elevated, low fat diet discussed and encouraged

## 2011-03-28 NOTE — Patient Instructions (Signed)
F/u in 2 months, end September or early October  Increase in the dose of your blood pressure medication to 20mg  one daily, ok to take two 10mg  tablets daily till done.  Pls cut back on fried food and pizza.  Drink alot of water, one pepsi once daily is ok.   Continue to enjoy the sports and the gospel music, I am happy that you are doing so very well  Fasting labs December 16 or after, lipid, chem 7, hepatic and pSA  Enjoy your vacation and be safe!!!

## 2011-03-28 NOTE — Assessment & Plan Note (Signed)
Medication compliance addressed. Commitment to regular exercise and healthy  food choices, with portion control discussed. DASH diet and low fat diet discussed and literature offered. Changes in medication made at this visit.  

## 2011-03-28 NOTE — Progress Notes (Signed)
  Subjective:    Patient ID: Terry Soto, male    DOB: 11/18/1965, 45 y.o.   MRN: [id number]  HPI The PT is here for follow up and re-evaluation of chronic medical conditions, medication management and review of recent lab and radiology data.  Preventive health is updated, specifically  Cancer screening,  and Immunization.    The PT denies any adverse reactions to current medications since the last visit.  There are no new concerns.  There are no specific complaints     Review of Systems Denies recent fever or chills. Denies sinus pressure, nasal congestion, ear pain or sore throat. Denies chest congestion, productive cough or wheezing. Denies chest pains, palpitations, paroxysmal nocturnal dyspnea, orthopnea and leg swelling Denies abdominal pain, nausea, vomiting,diarrhea or constipation.  Denies rectal bleeding or change in bowel movement. Denies dysuria, frequency, hesitancy or incontinence. Denies joint pain, swelling and limitation in mobility. Denies headaches, seizure, numbness, or tingling. Denies uncontrolled  depression, anxiety or insomnia.Seeing psychiatry every 3 months, no recent behavioral problems Denies skin break down or rash.        Objective:   Physical Exam Patient alert and oriented and in no Cardiopulmonary distress.  HEENT: No facial asymmetry, EOMI, no sinus tenderness, TM's clear, Oropharynx pink and moist.  Neck supple no adenopathy.  Chest: Clear to auscultation bilaterally.  CVS: S1, S2 no murmurs, no S3.  ABD: Soft non tender. Bowel sounds normal.  Ext: No edema  MS: Adequate ROM spine, shoulders, hips and knees.  Skin: Intact, no ulcerations or rash noted.  Psych: Good eye contact, flat  affect. not anxious or depressed appearing.  CNS: CN 2-12 intact, power, tone and sensation normal throughout.        Assessment & Plan:

## 2011-03-28 NOTE — Assessment & Plan Note (Signed)
Controlled on curent med and followed by psych

## 2011-04-09 ENCOUNTER — Other Ambulatory Visit: Payer: Self-pay | Admitting: Family Medicine

## 2011-05-21 ENCOUNTER — Ambulatory Visit (INDEPENDENT_AMBULATORY_CARE_PROVIDER_SITE_OTHER): Payer: Medicare Other | Admitting: *Deleted

## 2011-05-21 DIAGNOSIS — Z23 Encounter for immunization: Secondary | ICD-10-CM

## 2011-06-06 ENCOUNTER — Encounter: Payer: Self-pay | Admitting: Family Medicine

## 2011-06-11 ENCOUNTER — Encounter: Payer: Self-pay | Admitting: Family Medicine

## 2011-06-11 ENCOUNTER — Ambulatory Visit (INDEPENDENT_AMBULATORY_CARE_PROVIDER_SITE_OTHER): Payer: Medicare Other | Admitting: Family Medicine

## 2011-06-11 VITALS — BP 118/82 | HR 96 | Resp 16 | Ht 67.0 in | Wt 185.0 lb

## 2011-06-11 DIAGNOSIS — R5381 Other malaise: Secondary | ICD-10-CM

## 2011-06-11 DIAGNOSIS — I10 Essential (primary) hypertension: Secondary | ICD-10-CM

## 2011-06-11 DIAGNOSIS — E079 Disorder of thyroid, unspecified: Secondary | ICD-10-CM

## 2011-06-11 DIAGNOSIS — E785 Hyperlipidemia, unspecified: Secondary | ICD-10-CM

## 2011-06-11 DIAGNOSIS — R5383 Other fatigue: Secondary | ICD-10-CM

## 2011-06-11 DIAGNOSIS — Z125 Encounter for screening for malignant neoplasm of prostate: Secondary | ICD-10-CM

## 2011-06-11 DIAGNOSIS — F29 Unspecified psychosis not due to a substance or known physiological condition: Secondary | ICD-10-CM

## 2011-06-11 NOTE — Patient Instructions (Signed)
CPE in 4 months.  Fasting labs in in 4 months  Cbc, chem 7 , lipid, hepatic, psa  It is important that you exercise regularly at least 30 minutes 5 times a week. If you develop chest pain, have severe difficulty breathing, or feel very tired, stop exercising immediately and seek medical attention    A healthy diet is rich in fruit, vegetables and whole grains. Poultry fish, nuts and beans are a healthy choice for protein rather then red meat. A low sodium diet and drinking 64 ounces of water daily is generally recommended. Oils and sweet should be limited. Carbohydrates especially for those who are diabetic or overweight, should be limited to 30-45 gram per meal. It is important to eat on a regular schedule, at least 3 times daily. Snacks should be primarily fruits, vegetables or nuts.

## 2011-06-12 NOTE — Assessment & Plan Note (Signed)
Followed by endocrine

## 2011-06-12 NOTE — Assessment & Plan Note (Addendum)
updated lab needed prior to next visit, low fat diet discussed and encouraged

## 2011-06-12 NOTE — Assessment & Plan Note (Signed)
Stable and followed closely by psychiatry

## 2011-06-12 NOTE — Assessment & Plan Note (Signed)
Controlled, no change in medication  

## 2011-06-12 NOTE — Progress Notes (Signed)
  Subjective:    Patient ID: Terry Soto, male    DOB: 1966/04/29, 45 y.o.   MRN: [id number]  HPI The PT is here for follow up and re-evaluation of chronic medical conditions, medication management and review of any available recent lab and radiology data.  Preventive health is updated, specifically  Cancer screening and Immunization.   Questions or concerns regarding consultations or procedures which the PT has had in the interim are  addressed. The PT denies any adverse reactions to current medications since the last visit.  There are no new concerns.  There are no specific complaints       Review of Systems Denies recent fever or chills. Denies sinus pressure, nasal congestion, ear pain or sore throat. Denies chest congestion, productive cough or wheezing. Denies chest pains, palpitations and leg swelling Denies abdominal pain, nausea, vomiting,diarrhea or constipation.   Denies dysuria, frequency, hesitancy or incontinence. Denies joint pain, swelling and limitation in mobility. Denies headaches, seizures, numbness, or tingling. Denies depression, anxiety or insomnia. Denies skin break down or rash.        Objective:   Physical Exam Patient alert and oriented and in no cardiopulmonary distress.  HEENT: No facial asymmetry, EOMI, no sinus tenderness,  oropharynx pink and moist.  Neck supple no adenopathy.  Chest: Clear to auscultation bilaterally.  CVS: S1, S2 no murmurs, no S3.  ABD: Soft non tender. Bowel sounds normal.  Ext: No edema  MS: Adequate ROM spine, shoulders, hips and knees.  Skin: Intact, no ulcerations or rash noted.  Psych: Good eye contact, normal affect. Memory intact not anxious or depressed appearing.  CNS: CN 2-12 intact, power, tone and sensation normal throughout.        Assessment & Plan:

## 2011-06-13 ENCOUNTER — Other Ambulatory Visit: Payer: Self-pay | Admitting: Family Medicine

## 2011-07-16 ENCOUNTER — Other Ambulatory Visit: Payer: Self-pay | Admitting: Family Medicine

## 2011-07-29 ENCOUNTER — Ambulatory Visit (INDEPENDENT_AMBULATORY_CARE_PROVIDER_SITE_OTHER): Payer: Medicare Other | Admitting: Family Medicine

## 2011-07-29 ENCOUNTER — Encounter: Payer: Self-pay | Admitting: Family Medicine

## 2011-07-29 VITALS — BP 118/72 | HR 108 | Temp 98.4°F | Resp 18 | Ht 67.0 in | Wt 185.1 lb

## 2011-07-29 DIAGNOSIS — I10 Essential (primary) hypertension: Secondary | ICD-10-CM

## 2011-07-29 DIAGNOSIS — J40 Bronchitis, not specified as acute or chronic: Secondary | ICD-10-CM | POA: Insufficient documentation

## 2011-07-29 DIAGNOSIS — E785 Hyperlipidemia, unspecified: Secondary | ICD-10-CM

## 2011-07-29 MED ORDER — BENZONATATE 100 MG PO CAPS: 100.0000 mg | ORAL_CAPSULE | Freq: Three times a day (TID) | ORAL | Status: DC | PRN

## 2011-07-29 MED ORDER — SULFAMETHOXAZOLE-TRIMETHOPRIM 800-160 MG PO TABS: 1.0000 | ORAL_TABLET | Freq: Two times a day (BID) | ORAL | Status: AC

## 2011-07-29 NOTE — Patient Instructions (Signed)
f/u as before.  You are treated for bronchitis and medication has been sent to your pharmacy

## 2011-07-29 NOTE — Assessment & Plan Note (Signed)
3 day h/o chest congestion with sputum production

## 2011-07-30 ENCOUNTER — Ambulatory Visit: Payer: Medicare Other | Admitting: Family Medicine

## 2011-07-30 ENCOUNTER — Encounter: Payer: Self-pay | Admitting: Family Medicine

## 2011-07-30 NOTE — Assessment & Plan Note (Signed)
Controlled, no change in medication  

## 2011-07-30 NOTE — Progress Notes (Signed)
  Subjective:    Patient ID: Terry Soto, male    DOB: [birth year], 45 y.o.   MRN: [id number]  HPI 3 day h/o chest congestion with cough productive of thick sputum. Apetite and energy level slightly decreased, no fever documented, may be experiencing chills at times. Mother is the historian   Review of Systems See HPI  Denies sinus pressure, nasal congestion, ear pain or sore throat. Denies chest congestion, productive cough or wheezing. Denies chest pains, palpitations and leg swelling Denies abdominal pain, nausea, vomiting,diarrhea or constipation.    Denies skin break down or rash.        Objective:   Physical Exam Patient alert and oriented and in no cardiopulmonary distress.  HEENT: No facial asymmetry, EOMI, no sinus tenderness,  oropharynx pink and moist.  Neck supple no adenopathy.  Chest: decreased air entry scatter crackles , no wheezes CVS: S1, S2 no murmurs, no S3.  ABD: Soft non tender. Bowel sounds normal.  Ext: No edema       Assessment & Plan:

## 2011-08-12 ENCOUNTER — Telehealth: Payer: Self-pay | Admitting: Family Medicine

## 2011-08-12 DIAGNOSIS — J40 Bronchitis, not specified as acute or chronic: Secondary | ICD-10-CM

## 2011-08-14 MED ORDER — BENZONATATE 100 MG PO CAPS: 100.0000 mg | ORAL_CAPSULE | Freq: Three times a day (TID) | ORAL | Status: DC | PRN

## 2011-08-14 NOTE — Telephone Encounter (Signed)
Med sent in.

## 2011-08-14 NOTE — Telephone Encounter (Signed)
pls refill tessalon perles only and let her know

## 2011-08-14 NOTE — Telephone Encounter (Signed)
Mother states he is having the same symptoms as last time. Coughing, no fever or colored phlegm. Wants the same med you sent him in last time

## 2011-10-07 ENCOUNTER — Other Ambulatory Visit: Payer: Self-pay | Admitting: Family Medicine

## 2011-10-07 DIAGNOSIS — I10 Essential (primary) hypertension: Secondary | ICD-10-CM | POA: Diagnosis not present

## 2011-10-07 DIAGNOSIS — Z79899 Other long term (current) drug therapy: Secondary | ICD-10-CM | POA: Diagnosis not present

## 2011-10-07 DIAGNOSIS — R5381 Other malaise: Secondary | ICD-10-CM | POA: Diagnosis not present

## 2011-10-07 DIAGNOSIS — Z125 Encounter for screening for malignant neoplasm of prostate: Secondary | ICD-10-CM | POA: Diagnosis not present

## 2011-10-07 DIAGNOSIS — E559 Vitamin D deficiency, unspecified: Secondary | ICD-10-CM | POA: Diagnosis not present

## 2011-10-07 DIAGNOSIS — E785 Hyperlipidemia, unspecified: Secondary | ICD-10-CM | POA: Diagnosis not present

## 2011-10-08 LAB — BASIC METABOLIC PANEL
BUN: 9 mg/dL (ref 6–23)
Chloride: 102 mEq/L (ref 96–112)
Creat: 0.96 mg/dL (ref 0.50–1.35)
Glucose, Bld: 97 mg/dL (ref 70–99)
Potassium: 4.3 mEq/L (ref 3.5–5.3)

## 2011-10-08 LAB — CBC WITH DIFFERENTIAL/PLATELET
Eosinophils Relative: 5 % (ref 0–5)
HCT: 46.2 % (ref 39.0–52.0)
Hemoglobin: 15.2 g/dL (ref 13.0–17.0)
Lymphocytes Relative: 27 % (ref 12–46)
MCHC: 32.9 g/dL (ref 30.0–36.0)
MCV: 91.1 fL (ref 78.0–100.0)
Monocytes Absolute: 0.6 10*3/uL (ref 0.1–1.0)
Monocytes Relative: 10 % (ref 3–12)
Neutro Abs: 3.4 10*3/uL (ref 1.7–7.7)
RDW: 14.7 % (ref 11.5–15.5)
WBC: 5.9 10*3/uL (ref 4.0–10.5)

## 2011-10-08 LAB — HEPATIC FUNCTION PANEL: Bilirubin, Direct: <0.1 mg/dL (ref 0.0–0.3)

## 2011-10-08 LAB — PSA: PSA: 0.65 ng/mL (ref <=4.00)

## 2011-10-08 LAB — LIPID PANEL
LDL Cholesterol: 101 mg/dL — ABNORMAL HIGH (ref 0–99)
VLDL: 19 mg/dL (ref 0–40)

## 2011-10-17 ENCOUNTER — Encounter: Payer: Self-pay | Admitting: Family Medicine

## 2011-10-17 ENCOUNTER — Ambulatory Visit (INDEPENDENT_AMBULATORY_CARE_PROVIDER_SITE_OTHER): Payer: Medicare Other | Admitting: Family Medicine

## 2011-10-17 VITALS — BP 122/90 | HR 91 | Resp 16 | Ht 67.0 in | Wt 185.4 lb

## 2011-10-17 DIAGNOSIS — I10 Essential (primary) hypertension: Secondary | ICD-10-CM

## 2011-10-17 DIAGNOSIS — R51 Headache: Secondary | ICD-10-CM

## 2011-10-17 DIAGNOSIS — Z0000 Encounter for general adult medical examination without abnormal findings: Secondary | ICD-10-CM | POA: Diagnosis not present

## 2011-10-17 DIAGNOSIS — H6120 Impacted cerumen, unspecified ear: Secondary | ICD-10-CM | POA: Diagnosis not present

## 2011-10-17 DIAGNOSIS — Z1211 Encounter for screening for malignant neoplasm of colon: Secondary | ICD-10-CM

## 2011-10-17 DIAGNOSIS — E785 Hyperlipidemia, unspecified: Secondary | ICD-10-CM

## 2011-10-17 LAB — POC HEMOCCULT BLD/STL (OFFICE/1-CARD/DIAGNOSTIC): Fecal Occult Blood, POC: NEGATIVE

## 2011-10-17 MED ORDER — ROSUVASTATIN CALCIUM 40 MG PO TABS: ORAL_TABLET | ORAL | Status: DC

## 2011-10-17 MED ORDER — BENAZEPRIL HCL 20 MG PO TABS: ORAL_TABLET | ORAL | Status: DC

## 2011-10-17 NOTE — Patient Instructions (Addendum)
F/u in 5 month and 3 weeks.  No changes in medication.  Eat more fruit and vegetable please and work on losing weight, your blood pressure is slightly high.  Right  ear irrigation today

## 2011-11-02 NOTE — Progress Notes (Signed)
  Subjective:    Patient ID: Terry Soto, male    DOB: [birth year], 46 y.o.   MRN: [id number]  HPI The PT is here for annual examand re-evaluation of chronic medical conditions, medication management and review of any available recent lab and radiology data.  Preventive health is updated, specifically  Cancer screening and Immunization.   Questions or concerns regarding consultations or procedures which the PT has had in the interim are  addressed. The PT denies any adverse reactions to current medications since the last visit.  There are no new concerns.  There are no specific complaints      Review of Systems See HPI. History from Mother, whoo denies any current acute concerns, pt has mental retardation Denies recent fever or chills. Denies sinus pressure, nasal congestion, ear pain or sore throat. Denies chest congestion, productive cough or wheezing. Denies chest pains, palpitations and leg swelling Denies abdominal pain, nausea, vomiting,diarrhea or constipation.   Denies dysuria, frequency, hesitancy or incontinence. Denies joint pain, swelling and limitation in mobility. Denies headaches, seizures, numbness, or tingling. Denies depression, anxiety or insomnia. Denies skin break down or rash.        Objective:   Physical Exam  Pleasant well nourished male, alert  in no cardio-pulmonary distress. Afebrile. HEENT No facial trauma or asymetry. Sinuses non tender. EOMI, PERTL, fundoscopic exam is negative for hemorhages or exudates. External ears normal, tympanic membranes clear. Oropharynx moist, no exudate, poor dentition. Neck: supple, no adenopathy,JVD or thyromegaly.No bruits.  Chest: Clear to ascultation bilaterally.No crackles or wheezes. Non tender to palpation  Breast: No asymetry,no masses. No nipple discharge or inversion. No axillary or supraclavicular adenopathy  Cardiovascular system; Heart sounds normal,  S1 and  S2 ,no S3.  No murmur, or  thrill. Apical beat not displaced Peripheral pulses normal.  Abdomen: Soft, non tender, no organomegaly or masses. No bruits. Bowel sounds normal. No guarding, tenderness or rebound.  Rectal:  No mass. guaiac negative stool. Prostate smooth and firm   Musculoskeletal exam: Full ROM of spine, hips , shoulders and knees. No deformity ,swelling or crepitus noted. No muscle wasting or atrophy.   Neurologic: Cranial nerves 2 to 12 intact. Power, tone ,sensation and reflexes normal throughout. No disturbance in gait. No tremor.  Skin: Intact, no ulceration, erythema , scaling or rash noted. Pigmentation normal throughout  Psych; Normal mood and blunted  affect.        Assessment & Plan:

## 2011-11-02 NOTE — Assessment & Plan Note (Signed)
Controlled, no change in medication  

## 2011-11-02 NOTE — Assessment & Plan Note (Signed)
followed by endo.  

## 2011-11-02 NOTE — Assessment & Plan Note (Signed)
Hyperlipidemia:Low fat diet discussed and encouraged.  Controlled, no change in medication   

## 2011-11-07 DIAGNOSIS — E061 Subacute thyroiditis: Secondary | ICD-10-CM | POA: Diagnosis not present

## 2011-11-07 DIAGNOSIS — I10 Essential (primary) hypertension: Secondary | ICD-10-CM | POA: Diagnosis not present

## 2011-11-07 DIAGNOSIS — E559 Vitamin D deficiency, unspecified: Secondary | ICD-10-CM | POA: Diagnosis not present

## 2011-12-09 DIAGNOSIS — F919 Conduct disorder, unspecified: Secondary | ICD-10-CM | POA: Diagnosis not present

## 2012-01-15 DIAGNOSIS — H5210 Myopia, unspecified eye: Secondary | ICD-10-CM | POA: Diagnosis not present

## 2012-01-15 DIAGNOSIS — H52229 Regular astigmatism, unspecified eye: Secondary | ICD-10-CM | POA: Diagnosis not present

## 2012-01-15 DIAGNOSIS — H16109 Unspecified superficial keratitis, unspecified eye: Secondary | ICD-10-CM | POA: Diagnosis not present

## 2012-01-15 DIAGNOSIS — H40009 Preglaucoma, unspecified, unspecified eye: Secondary | ICD-10-CM | POA: Diagnosis not present

## 2012-02-06 DIAGNOSIS — I10 Essential (primary) hypertension: Secondary | ICD-10-CM | POA: Diagnosis not present

## 2012-02-06 DIAGNOSIS — E061 Subacute thyroiditis: Secondary | ICD-10-CM | POA: Diagnosis not present

## 2012-02-06 DIAGNOSIS — E559 Vitamin D deficiency, unspecified: Secondary | ICD-10-CM | POA: Diagnosis not present

## 2012-03-26 ENCOUNTER — Ambulatory Visit (INDEPENDENT_AMBULATORY_CARE_PROVIDER_SITE_OTHER): Payer: Medicare Other | Admitting: Family Medicine

## 2012-03-26 ENCOUNTER — Encounter: Payer: Self-pay | Admitting: Family Medicine

## 2012-03-26 VITALS — BP 120/80 | HR 106 | Resp 18 | Ht 67.0 in | Wt 189.0 lb

## 2012-03-26 DIAGNOSIS — Z0000 Encounter for general adult medical examination without abnormal findings: Secondary | ICD-10-CM

## 2012-03-26 DIAGNOSIS — R259 Unspecified abnormal involuntary movements: Secondary | ICD-10-CM

## 2012-03-26 NOTE — Progress Notes (Signed)
Subjective:    Patient ID: Terry Soto, male    DOB: [birth year], 46 y.o.   MRN: [id number]  HPI 2 month h/o abnormal frowning and hands drawing up, mom thought it was related to psych med,but psych says otherwise Preventive Screening-Counseling & Management   Patient present here today for a Medicare annual wellness visit.   Current Problems (verified)   Medications Prior to Visit Allergies (verified)   PAST HISTORY  Family History  Social History single , no children, lives with Mom   Risk Factors  Current exercise habits: 2 days per weeek   Dietary issues discussed:cut out sodas, eat more fruit and veg, and water is the drink   Cardiac risk factors:none known   Depression Screen  (Note: if answer to either of the following is "Yes", a more complete depression screening is indicated)   Over the past two weeks, have you felt down, depressed or hopeless? No  Over the past two weeks, have you felt little interest or pleasure in doing things? No  Have you lost interest or pleasure in daily life? No  Do you often feel hopeless? No  Do you cry easily over simple problems? No   Activities of Daily Living  In your present state of health, do you have any difficulty performing the following activities?  Driving?:Never had a license Managing money?:mentanally challenged from birth, unable  Feeding yourself?:No Getting from bed to chair?:No Climbing a flight of stairs?:No Preparing food and eating?:No Bathing or showering?:No Getting dressed?:No Getting to the toilet?:No Using the toilet?:No Moving around from place to place?: No  Fall Risk Assessment In the past year have you fallen or had a near fall?:No Are you currently taking any medications that make you dizziness?:No   Hearing Difficulties: No Do you often ask people to speak up or repeat themselves?:at times, primarily due to mental retardation Do you experience ringing or noises in your ears?:No Do you have  difficulty understanding soft or whispered voices?:yes  Cognitive Testing  Alert? Yes Normal Appearance?Yes  Oriented to person? Yes Place? Yes  Time? Yes  Displays appropriate judgment?Yes to a limited extent as in safety crossing the road  Can read the correct time from a watch face? yes Are you having problems remembering things?yes  Advanced Directives have been discussed with the patient?Yes    List the Names of Other Physician/Practitioners you currently use: psychiatrist at [place] dr Donell Beers and Dr Margaretmary Bayley endo dentist Dr Loleta Chance and Dr Joline Maxcy any recent Medical Services you may have received from other than [facility] providers in the past year (date may be approximate).   Assessment:    Annual Wellness Exam   Plan:    During the course of the visit the patient was educated and counseled about appropriate screening and preventive services including:  A healthy diet is rich in fruit, vegetables and whole grains. Poultry fish, nuts and beans are a healthy choice for protein rather then red meat. A low sodium diet and drinking 64 ounces of water daily is generally recommended. Oils and sweet should be limited. Carbohydrates especially for those who are diabetic or overweight, should be limited to 30-45 gram per meal. It is important to eat on a regular schedule, at least 3 times daily. Snacks should be primarily fruits, vegetables or nuts. It is important that you exercise regularly at least 30 minutes 5 times a week. If you develop chest pain, have severe difficulty breathing, or feel very tired, stop exercising  immediately and seek medical attention  Immunization reviewed and updated. Cancer screening reviewed and updated    Patient Instructions (the written plan) was given to the patient.  Medicare Attestation  I have personally reviewed:  The patient's medical and social history  Their use of alcohol, tobacco or illicit drugs  Their current medications and  supplements  The patient's functional ability including ADLs,fall risks, home safety risks, cognitive, and hearing and visual impairment  Diet and physical activities  Evidence for depression or mood disorders  The patient's weight, height, BMI, and visual acuity have been recorded in the chart. I have made referrals, counseling, and provided education to the patient based on review of the above and I have provided the patient with a written personalized care plan for preventive services.     Review of Systems    Mother had a primary concern of new onset, in the past month contracting of fingers in hands and occasional twiching of face, thought it may have been related to antipsychotics, advised by psch to speak with PCP Objective:   Physical Exam        Assessment & Plan:

## 2012-03-26 NOTE — Patient Instructions (Addendum)
F/u as before.  You are referred to Dr Gerilyn Pilgrim for further evaluation, his office will call with appointment  It is important that you exercise regularly at least 30 minutes 5 times a week. If you develop chest pain, have severe difficulty breathing, or feel very tired, stop exercising immediately and seek medical attention   Eat more fruit and vegetable

## 2012-03-28 DIAGNOSIS — Z0000 Encounter for general adult medical examination without abnormal findings: Secondary | ICD-10-CM | POA: Insufficient documentation

## 2012-03-28 NOTE — Assessment & Plan Note (Signed)
Annual wellness exam done at this visit.as documented  Healthy lifestyle, weight loss, and advanced directives were aaddressed

## 2012-04-11 ENCOUNTER — Other Ambulatory Visit: Payer: Self-pay | Admitting: Family Medicine

## 2012-04-13 DIAGNOSIS — Z0000 Encounter for general adult medical examination without abnormal findings: Secondary | ICD-10-CM | POA: Diagnosis not present

## 2012-04-13 DIAGNOSIS — R51 Headache: Secondary | ICD-10-CM | POA: Diagnosis not present

## 2012-04-13 DIAGNOSIS — E785 Hyperlipidemia, unspecified: Secondary | ICD-10-CM | POA: Diagnosis not present

## 2012-04-14 LAB — LIPID PANEL
LDL Cholesterol: 95 mg/dL (ref 0–99)
Total CHOL/HDL Ratio: 3.6 Ratio
VLDL: 16 mg/dL (ref 0–40)

## 2012-04-14 LAB — BASIC METABOLIC PANEL
BUN: 7 mg/dL (ref 6–23)
Potassium: 4.4 mEq/L (ref 3.5–5.3)
Sodium: 138 mEq/L (ref 135–145)

## 2012-04-14 LAB — HEPATIC FUNCTION PANEL
AST: 22 U/L (ref 0–37)
Alkaline Phosphatase: 80 U/L (ref 39–117)
Bilirubin, Direct: 0.1 mg/dL (ref 0.0–0.3)
Indirect Bilirubin: 0.1 mg/dL (ref 0.0–0.9)
Total Bilirubin: 0.2 mg/dL — ABNORMAL LOW (ref 0.3–1.2)

## 2012-04-15 ENCOUNTER — Ambulatory Visit (INDEPENDENT_AMBULATORY_CARE_PROVIDER_SITE_OTHER): Payer: Medicare Other | Admitting: Family Medicine

## 2012-04-15 ENCOUNTER — Encounter: Payer: Self-pay | Admitting: Family Medicine

## 2012-04-15 VITALS — BP 130/84 | HR 86 | Resp 16 | Ht 67.0 in | Wt 185.0 lb

## 2012-04-15 DIAGNOSIS — R5383 Other fatigue: Secondary | ICD-10-CM

## 2012-04-15 DIAGNOSIS — Z125 Encounter for screening for malignant neoplasm of prostate: Secondary | ICD-10-CM

## 2012-04-15 DIAGNOSIS — M949 Disorder of cartilage, unspecified: Secondary | ICD-10-CM | POA: Diagnosis not present

## 2012-04-15 DIAGNOSIS — M899 Disorder of bone, unspecified: Secondary | ICD-10-CM | POA: Diagnosis not present

## 2012-04-15 DIAGNOSIS — R5381 Other malaise: Secondary | ICD-10-CM

## 2012-04-15 DIAGNOSIS — E785 Hyperlipidemia, unspecified: Secondary | ICD-10-CM

## 2012-04-15 DIAGNOSIS — I10 Essential (primary) hypertension: Secondary | ICD-10-CM

## 2012-04-15 MED ORDER — BENAZEPRIL HCL 20 MG PO TABS: ORAL_TABLET | ORAL | Status: DC

## 2012-04-15 NOTE — Assessment & Plan Note (Signed)
Hyperlipidemia:Low fat diet discussed and encouraged.  Controlled, no change in medication   

## 2012-04-15 NOTE — Assessment & Plan Note (Signed)
Unchanged, and followed by endo

## 2012-04-15 NOTE — Patient Instructions (Addendum)
F/u in 6 months, call if needed to be seen before this.  Labs, and blood pressure are excellent.  Keep appointments with Dr. Jeanell Sparrow re his calcium   Call in October for flu vaccine   Fasting lipid, cmp, Vit D, PSA and cbc  Feb6 or after It is important that you exercise regularly at least 30 minutes 5 times a week. If you develop chest pain, have severe difficulty breathing, or feel very tired, stop exercising immediately and seek medical attention   A healthy diet is rich in fruit, vegetables and whole grains. Poultry fish, nuts and beans are a healthy choice for protein rather then red meat. A low sodium diet and drinking 64 ounces of water daily is generally recommended. Oils and sweet should be limited. Carbohydrates especially for those who are diabetic or overweight, should be limited to 30-45 gram per meal. It is important to eat on a regular schedule, at least 3 times daily. Snacks should be primarily fruits, vegetables or nuts.

## 2012-04-15 NOTE — Assessment & Plan Note (Signed)
Controlled, no change in medication  

## 2012-04-15 NOTE — Progress Notes (Signed)
  Subjective:    Patient ID: Terry Soto, male    DOB: [birth year], 46 y.o.   MRN: [id number]  HPI The PT is here for follow up and re-evaluation of chronic medical conditions, medication management and review of any available recent lab and radiology data.  Preventive health is updated, specifically  Cancer screening and Immunization.   Questions or concerns regarding consultations or procedures which the PT has had in the interim are  addressed. The PT denies any adverse reactions to current medications since the last visit.  There are no new concerns. Has upcoming appt with neurology for tremor evaluation There are no specific complaints       Review of Systems See HPI Denies recent fever or chills. Denies sinus pressure, nasal congestion, ear pain or sore throat. Denies chest congestion, productive cough or wheezing. Denies chest pains, palpitations and leg swelling Denies abdominal pain, nausea, vomiting,diarrhea or constipation.   Denies dysuria, frequency, hesitancy or incontinence. Denies joint pain, swelling and limitation in mobility. Denies headaches, seizures, numbness, or tingling. Denies depression, anxiety or insomnia. Denies skin break down or rash.        Objective:   Physical Exam Patient alert and oriented and in no cardiopulmonary distress.  HEENT: No facial asymmetry, EOMI, no sinus tenderness,  oropharynx pink and moist.  Neck supple no adenopathy.  Chest: Clear to auscultation bilaterally.  CVS: S1, S2 no murmurs, no S3.  ABD: Soft non tender. Bowel sounds normal.  Ext: No edema  MS: Adequate ROM spine, shoulders, hips and knees.  Skin: Intact, no ulcerations or rash noted.  Psych: Good eye contact, normal affect. Memory intact not anxious or depressed appearing.  CNS: CN 2-12 intact, power, tone and sensation normal throughout.        Assessment & Plan:

## 2012-04-27 ENCOUNTER — Other Ambulatory Visit: Payer: Self-pay | Admitting: Neurology

## 2012-04-27 DIAGNOSIS — I639 Cerebral infarction, unspecified: Secondary | ICD-10-CM

## 2012-04-27 DIAGNOSIS — R259 Unspecified abnormal involuntary movements: Secondary | ICD-10-CM | POA: Diagnosis not present

## 2012-04-27 DIAGNOSIS — E785 Hyperlipidemia, unspecified: Secondary | ICD-10-CM | POA: Diagnosis not present

## 2012-04-27 DIAGNOSIS — I10 Essential (primary) hypertension: Secondary | ICD-10-CM | POA: Diagnosis not present

## 2012-04-27 DIAGNOSIS — I6350 Cerebral infarction due to unspecified occlusion or stenosis of unspecified cerebral artery: Secondary | ICD-10-CM | POA: Diagnosis not present

## 2012-04-29 ENCOUNTER — Ambulatory Visit (HOSPITAL_COMMUNITY)
Admission: RE | Admit: 2012-04-29 | Discharge: 2012-04-29 | Disposition: A | Discharge status: Home / Self Care | Payer: Medicare Other | Source: Ambulatory Visit | Attending: Neurology | Admitting: Neurology

## 2012-04-29 DIAGNOSIS — I6350 Cerebral infarction due to unspecified occlusion or stenosis of unspecified cerebral artery: Secondary | ICD-10-CM | POA: Diagnosis not present

## 2012-04-29 DIAGNOSIS — I639 Cerebral infarction, unspecified: Secondary | ICD-10-CM

## 2012-05-01 ENCOUNTER — Telehealth: Payer: Self-pay | Admitting: Family Medicine

## 2012-05-01 NOTE — Telephone Encounter (Signed)
Message left

## 2012-05-01 NOTE — Telephone Encounter (Signed)
I see the scan is back but no result note has been attached yet. Please advise

## 2012-05-01 NOTE — Telephone Encounter (Signed)
pls advise brain scan is normal and no blockage in neck arteries

## 2012-05-11 DIAGNOSIS — F919 Conduct disorder, unspecified: Secondary | ICD-10-CM | POA: Diagnosis not present

## 2012-05-26 DIAGNOSIS — R259 Unspecified abnormal involuntary movements: Secondary | ICD-10-CM | POA: Diagnosis not present

## 2012-06-03 ENCOUNTER — Ambulatory Visit (INDEPENDENT_AMBULATORY_CARE_PROVIDER_SITE_OTHER): Payer: Medicare Other

## 2012-06-03 DIAGNOSIS — Z23 Encounter for immunization: Secondary | ICD-10-CM

## 2012-08-18 DIAGNOSIS — R259 Unspecified abnormal involuntary movements: Secondary | ICD-10-CM | POA: Diagnosis not present

## 2012-09-14 ENCOUNTER — Other Ambulatory Visit: Payer: Self-pay | Admitting: Family Medicine

## 2012-10-07 ENCOUNTER — Encounter: Payer: Self-pay | Admitting: Family Medicine

## 2012-10-07 ENCOUNTER — Ambulatory Visit (INDEPENDENT_AMBULATORY_CARE_PROVIDER_SITE_OTHER): Payer: Medicare Other | Admitting: Family Medicine

## 2012-10-07 VITALS — BP 126/80 | HR 100 | Resp 18 | Ht 67.0 in | Wt 183.0 lb

## 2012-10-07 DIAGNOSIS — R5383 Other fatigue: Secondary | ICD-10-CM | POA: Diagnosis not present

## 2012-10-07 DIAGNOSIS — Z125 Encounter for screening for malignant neoplasm of prostate: Secondary | ICD-10-CM | POA: Diagnosis not present

## 2012-10-07 DIAGNOSIS — E785 Hyperlipidemia, unspecified: Secondary | ICD-10-CM

## 2012-10-07 DIAGNOSIS — Z0000 Encounter for general adult medical examination without abnormal findings: Secondary | ICD-10-CM | POA: Diagnosis not present

## 2012-10-07 DIAGNOSIS — Z1211 Encounter for screening for malignant neoplasm of colon: Secondary | ICD-10-CM | POA: Diagnosis not present

## 2012-10-07 DIAGNOSIS — I10 Essential (primary) hypertension: Secondary | ICD-10-CM

## 2012-10-07 DIAGNOSIS — R5381 Other malaise: Secondary | ICD-10-CM

## 2012-10-07 NOTE — Patient Instructions (Addendum)
F/u in 6 month, call if you need me before  No med changes.  Fasting labs as soon as possible  It is important that you exercise regularly at least 30 minutes 5 times a week. If you develop chest pain, have severe difficulty breathing, or feel very tired, stop exercising immediately and seek medical attention   A healthy diet is rich in fruit, vegetables and whole grains. Poultry fish, nuts and beans are a healthy choice for protein rather then red meat. A low sodium diet and drinking 64 ounces of water daily is generally recommended. Oils and sweet should be limited. Carbohydrates especially for those who are diabetic or overweight, should be limited to 30-45 gram per meal. It is important to eat on a regular schedule, at least 3 times daily. Snacks should be primarily fruits, vegetables or nuts.

## 2012-10-08 DIAGNOSIS — F919 Conduct disorder, unspecified: Secondary | ICD-10-CM | POA: Diagnosis not present

## 2012-10-16 NOTE — Assessment & Plan Note (Addendum)
Annual exam completed as documented. No new problems identified. Pt is stable and is doing well Immunization and screening tests are up to date

## 2012-10-16 NOTE — Progress Notes (Signed)
  Subjective:    Patient ID: Terry Soto, male    DOB: November 09, 1965, 47 y.o.   MRN: [id number]  HPI The PT is here for annual exam and re-evaluation of chronic medical conditions, medication management and review of any available recent lab and radiology data.  Preventive health is updated, specifically  Cancer screening and Immunization.   . The PT denies any adverse reactions to current medications since the last visit.  There are no new concerns.  There are no specific complaints       Review of Systems See HPI, note , history also checked with his mother, pt has moderate MR with impaired speech Denies recent fever or chills. Denies sinus pressure, nasal congestion, ear pain or sore throat. Denies chest congestion, productive cough or wheezing. Denies chest pains, palpitations and leg swelling Denies abdominal pain, nausea, vomiting,diarrhea or constipation.   Denies dysuria, frequency, hesitancy or incontinence. Denies joint pain, swelling and limitation in mobility. Denies headaches, seizures, numbness, or tingling. Denies depression, anxiety or insomnia. Denies skin break down or rash.        Objective:   Physical Exam  Pleasant well nourished male, alert , in no cardio-pulmonary distress.Moderate mental retardation Afebrile. HEENT No facial trauma or asymetry. Sinuses non tender. EOMI, PERTL, fundoscopic exam is negative for hemorhages or exudates. External ears normal, tympanic membranes clear. Oropharynx moist, no exudate, fair dentition. Neck: supple, no adenopathy,JVD or thyromegaly.No bruits.  Chest: Clear to ascultation bilaterally.No crackles or wheezes. Non tender to palpation  Breast: No asymetry,no masses. No nipple discharge or inversion. No axillary or supraclavicular adenopathy  Cardiovascular system; Heart sounds normal,  S1 and  S2 ,no S3.  No murmur, or thrill. Apical beat not displaced Peripheral pulses normal.  Abdomen: Soft, non tender,  no organomegaly or masses. No bruits. Bowel sounds normal. No guarding, tenderness or rebound.  Rectal:  No mass. guaiac negative stool. Prostate smooth and firm    Musculoskeletal exam: Full ROM of spine, hips , shoulders and knees. No deformity ,swelling or crepitus noted. No muscle wasting or atrophy.   Neurologic: Cranial nerves 2 to 12 intact. Power, tone ,sensation and reflexes normal throughout. No disturbance in gait. No tremor.  Skin: Intact, no ulceration, erythema , scaling or rash noted. Pigmentation normal throughout  Psych; Denies suicidal or homicidal ideation. No excessive anxiety noted      Assessment & Plan:

## 2012-10-19 LAB — HEMOCCULT GUIAC POC 1CARD (OFFICE): Fecal Occult Blood, POC: NEGATIVE

## 2012-10-19 NOTE — Addendum Note (Signed)
Addended by: Abner Greenspan on: 10/19/2012 04:04 PM   Modules accepted: Orders

## 2012-11-10 LAB — COMPREHENSIVE METABOLIC PANEL
BUN: 12 mg/dL (ref 6–23)
CO2: 30 mEq/L (ref 19–32)
Calcium: 11.4 mg/dL — ABNORMAL HIGH (ref 8.4–10.5)
Chloride: 102 mEq/L (ref 96–112)
Creat: 0.92 mg/dL (ref 0.50–1.35)
Glucose, Bld: 99 mg/dL (ref 70–99)
Total Bilirubin: 0.3 mg/dL (ref 0.3–1.2)

## 2012-11-10 LAB — CBC WITH DIFFERENTIAL/PLATELET
Basophils Absolute: 0 10*3/uL (ref 0.0–0.1)
Eosinophils Relative: 4 % (ref 0–5)
HCT: 44.4 % (ref 39.0–52.0)
Hemoglobin: 15.5 g/dL (ref 13.0–17.0)
Lymphocytes Relative: 27 % (ref 12–46)
Lymphs Abs: 1.6 10*3/uL (ref 0.7–4.0)
MCV: 85.4 fL (ref 78.0–100.0)
Monocytes Absolute: 0.5 10*3/uL (ref 0.1–1.0)
Neutro Abs: 3.5 10*3/uL (ref 1.7–7.7)
RBC: 5.2 MIL/uL (ref 4.22–5.81)
WBC: 5.9 10*3/uL (ref 4.0–10.5)

## 2012-11-10 LAB — LIPID PANEL
Cholesterol: 157 mg/dL (ref 0–200)
HDL: 41 mg/dL (ref >39)
LDL Cholesterol: 103 mg/dL — ABNORMAL HIGH (ref 0–99)
Triglycerides: 65 mg/dL (ref <150)

## 2012-11-25 DIAGNOSIS — E039 Hypothyroidism, unspecified: Secondary | ICD-10-CM | POA: Diagnosis not present

## 2012-12-07 DIAGNOSIS — R259 Unspecified abnormal involuntary movements: Secondary | ICD-10-CM | POA: Diagnosis not present

## 2012-12-10 ENCOUNTER — Telehealth: Payer: Self-pay | Admitting: Family Medicine

## 2012-12-10 MED ORDER — BENAZEPRIL HCL 20 MG PO TABS: ORAL_TABLET | ORAL | Status: DC

## 2012-12-10 NOTE — Telephone Encounter (Signed)
Med sent.

## 2013-01-12 DIAGNOSIS — I10 Essential (primary) hypertension: Secondary | ICD-10-CM | POA: Diagnosis not present

## 2013-01-12 DIAGNOSIS — E061 Subacute thyroiditis: Secondary | ICD-10-CM | POA: Diagnosis not present

## 2013-01-12 DIAGNOSIS — E559 Vitamin D deficiency, unspecified: Secondary | ICD-10-CM | POA: Diagnosis not present

## 2013-01-13 ENCOUNTER — Other Ambulatory Visit: Payer: Self-pay | Admitting: Family Medicine

## 2013-01-25 DIAGNOSIS — M79609 Pain in unspecified limb: Secondary | ICD-10-CM | POA: Diagnosis not present

## 2013-01-25 DIAGNOSIS — M545 Low back pain: Secondary | ICD-10-CM | POA: Diagnosis not present

## 2013-03-08 DIAGNOSIS — F919 Conduct disorder, unspecified: Secondary | ICD-10-CM | POA: Diagnosis not present

## 2013-03-22 ENCOUNTER — Ambulatory Visit (HOSPITAL_COMMUNITY)
Admission: RE | Admit: 2013-03-22 | Discharge: 2013-03-22 | Disposition: A | Discharge status: Home / Self Care | Payer: Medicare Other | Source: Ambulatory Visit | Attending: Family Medicine | Admitting: Family Medicine

## 2013-03-22 ENCOUNTER — Ambulatory Visit (INDEPENDENT_AMBULATORY_CARE_PROVIDER_SITE_OTHER): Payer: Medicare Other | Admitting: Family Medicine

## 2013-03-22 VITALS — BP 106/70 | HR 82 | Resp 18 | Ht 67.0 in | Wt 184.0 lb

## 2013-03-22 DIAGNOSIS — R259 Unspecified abnormal involuntary movements: Secondary | ICD-10-CM

## 2013-03-22 DIAGNOSIS — M7989 Other specified soft tissue disorders: Secondary | ICD-10-CM | POA: Insufficient documentation

## 2013-03-22 DIAGNOSIS — M25449 Effusion, unspecified hand: Secondary | ICD-10-CM | POA: Diagnosis not present

## 2013-03-22 DIAGNOSIS — M19049 Primary osteoarthritis, unspecified hand: Secondary | ICD-10-CM | POA: Diagnosis not present

## 2013-03-22 DIAGNOSIS — M25441 Effusion, right hand: Secondary | ICD-10-CM | POA: Insufficient documentation

## 2013-03-22 DIAGNOSIS — I10 Essential (primary) hypertension: Secondary | ICD-10-CM | POA: Diagnosis not present

## 2013-03-22 MED ORDER — PREDNISONE 5 MG PO TABS: 5.0000 mg | ORAL_TABLET | Freq: Two times a day (BID) | ORAL | Status: AC

## 2013-03-22 NOTE — Patient Instructions (Addendum)
F/u as before  You are referred for an xray of your right hand, and prednisone  One twice daily is prescribed for 5 days

## 2013-03-28 ENCOUNTER — Encounter: Payer: Self-pay | Admitting: Family Medicine

## 2013-03-28 NOTE — Assessment & Plan Note (Signed)
No increase tremor noted on exam

## 2013-03-28 NOTE — Assessment & Plan Note (Signed)
Acute onset, xray of hand and short sharp course of prednisone

## 2013-03-28 NOTE — Progress Notes (Signed)
  Subjective:    Patient ID: Terry Soto, male    DOB: 1966/04/02, 47 y.o.   MRN: [id number]  HPI 2 day h/o swelling of right hand, no known trauma ,Also noted is  increased tremor. Prior to this he has been well Immunization and screening is up to date    Review of Systems See HPI Mother is historian , pt is mentally challenged and also ha speech limitation Denies recent fever or chills. Denies sinus pressure, nasal congestion, ear pain or sore throat. Denies chest congestion, productive cough or wheezing. Denies chest pains, palpitations and leg swelling Denies abdominal pain, nausea, vomiting,diarrhea or constipation.   Denies dysuria, frequency, hesitancy or incontinence.  Denies uncontrolled epression, anxiety or insomnia. Denies skin break down or rash.        Objective:   Physical Exam  Patient alert and oriented and in no cardiopulmonary distress.  HEENT: No facial asymmetry, EOMI, no sinus tenderness,  oropharynx pink and moist.  Neck supple no adenopathy.  Chest: Clear to auscultation bilaterally.  CVS: S1, S2 no murmurs, no S3.  ABD: Soft non tender. Bowel sounds normal.  Ext: mild swelling of right hand, no localized erythema , warmth or tenderness, full ROM  MS: Adequate ROM spine, shoulders, hips and knees.  Skin: Intact, no ulcerations or rash noted.         Assessment & Plan:  .

## 2013-03-28 NOTE — Assessment & Plan Note (Signed)
Controlled, no change in medication  

## 2013-04-05 DIAGNOSIS — G20 Parkinson's disease: Secondary | ICD-10-CM | POA: Diagnosis not present

## 2013-04-05 DIAGNOSIS — R259 Unspecified abnormal involuntary movements: Secondary | ICD-10-CM | POA: Diagnosis not present

## 2013-04-07 ENCOUNTER — Encounter: Payer: Self-pay | Admitting: Family Medicine

## 2013-04-07 ENCOUNTER — Ambulatory Visit (INDEPENDENT_AMBULATORY_CARE_PROVIDER_SITE_OTHER): Payer: Medicare Other | Admitting: Family Medicine

## 2013-04-07 VITALS — BP 112/80 | HR 91 | Resp 16 | Ht 67.0 in | Wt 183.8 lb

## 2013-04-07 DIAGNOSIS — E785 Hyperlipidemia, unspecified: Secondary | ICD-10-CM | POA: Diagnosis not present

## 2013-04-07 DIAGNOSIS — F29 Unspecified psychosis not due to a substance or known physiological condition: Secondary | ICD-10-CM

## 2013-04-07 DIAGNOSIS — I10 Essential (primary) hypertension: Secondary | ICD-10-CM

## 2013-04-07 DIAGNOSIS — R259 Unspecified abnormal involuntary movements: Secondary | ICD-10-CM | POA: Diagnosis not present

## 2013-04-07 DIAGNOSIS — M25441 Effusion, right hand: Secondary | ICD-10-CM

## 2013-04-07 DIAGNOSIS — M25449 Effusion, unspecified hand: Secondary | ICD-10-CM

## 2013-04-07 MED ORDER — BENAZEPRIL HCL 20 MG PO TABS: ORAL_TABLET | ORAL | Status: DC

## 2013-04-07 NOTE — Patient Instructions (Addendum)
F/u in early to  mid October, please call if you need me before  Fasting lipid and cmp in October before visit  You are doing very well, no changes in your medication at this time

## 2013-04-07 NOTE — Progress Notes (Signed)
  Subjective:    Patient ID: Terry Soto, male    DOB: Dec 14, 1965, 47 y.o.   MRN: [id number]  HPI The PT is here for follow up and re-evaluation of chronic medical conditions, medication management and review of any available recent lab and radiology data.  Preventive health is updated, specifically  Cancer screening and Immunization.   Questions or concerns regarding consultations or procedures which the PT has had in the interim are  addressed. The PT denies any adverse reactions to current medications since the last visit.  There are no new concerns.  There are no specific complaints       Review of Systems See HPI, history provided primarily by Mother Denies recent fever or chills. Denies sinus pressure, nasal congestion, ear pain or sore throat. Denies chest congestion, productive cough or wheezing. Denies chest pains, palpitations and leg swelling Denies abdominal pain, nausea, vomiting,diarrhea or constipation.   Denies dysuria, frequency, hesitancy or incontinence. Denies joint pain, swelling and limitation in mobility. Denies headaches, seizures, numbness, or tingling. Denies depression, anxiety or insomnia. Denies skin break down or rash.        Objective:   Physical Exam  Patient alert  and in no cardiopulmonary distress.  HEENT: No facial asymmetry, EOMI, no sinus tenderness,  oropharynx pink and moist.  Neck supple no adenopathy.  Chest: Clear to auscultation bilaterally.  CVS: S1, S2 no murmurs, no S3.  ABD: Soft non tender. Bowel sounds normal.  Ext: No edema  MS: Adequate ROM spine, shoulders, hips and knees.  Skin: Intact, no ulcerations or rash noted.  Psych: Good eye contact, flat  affect. Memory unable to assess fully, impaired due to mR. not anxious or depressed appearing.  CNS: CN 2-12 intact, power, tone and sensation normal throughout.       Assessment & Plan:

## 2013-04-11 NOTE — Assessment & Plan Note (Signed)
Normal xray, mOm reassured

## 2013-04-11 NOTE — Assessment & Plan Note (Signed)
Improved, treated by neurology

## 2013-04-11 NOTE — Assessment & Plan Note (Signed)
Followed by endo.  

## 2013-04-11 NOTE — Assessment & Plan Note (Signed)
LDL slightly increased. No med change Hyperlipidemia:Low fat diet discussed and encouraged.

## 2013-04-11 NOTE — Assessment & Plan Note (Signed)
Stable, treated by psych in [place]

## 2013-04-11 NOTE — Assessment & Plan Note (Signed)
Controlled, no change in medication DASH diet and commitment to daily physical activity for a minimum of 30 minutes discussed and encouraged, as a part of hypertension management. The importance of attaining a healthy weight is also discussed.  

## 2013-05-17 ENCOUNTER — Ambulatory Visit: Payer: Medicare Other

## 2013-05-19 ENCOUNTER — Ambulatory Visit (INDEPENDENT_AMBULATORY_CARE_PROVIDER_SITE_OTHER): Payer: Medicare Other

## 2013-05-19 DIAGNOSIS — Z23 Encounter for immunization: Secondary | ICD-10-CM

## 2013-06-25 ENCOUNTER — Telehealth: Payer: Self-pay | Admitting: *Deleted

## 2013-06-25 NOTE — Telephone Encounter (Signed)
Needs lab order faxed to the lab.

## 2013-06-25 NOTE — Telephone Encounter (Signed)
Noted and faxed.  

## 2013-06-28 ENCOUNTER — Ambulatory Visit (INDEPENDENT_AMBULATORY_CARE_PROVIDER_SITE_OTHER): Payer: Medicare Other | Admitting: Family Medicine

## 2013-06-28 ENCOUNTER — Encounter: Payer: Self-pay | Admitting: Family Medicine

## 2013-06-28 VITALS — BP 124/80 | HR 93 | Temp 98.9°F | Resp 18 | Ht 67.0 in | Wt 183.0 lb

## 2013-06-28 DIAGNOSIS — J209 Acute bronchitis, unspecified: Secondary | ICD-10-CM | POA: Diagnosis not present

## 2013-06-28 DIAGNOSIS — I10 Essential (primary) hypertension: Secondary | ICD-10-CM

## 2013-06-28 MED ORDER — PENICILLIN V POTASSIUM 500 MG PO TABS: 500.0000 mg | ORAL_TABLET | Freq: Three times a day (TID) | ORAL | Status: DC

## 2013-06-28 MED ORDER — BENZONATATE 100 MG PO CAPS: 100.0000 mg | ORAL_CAPSULE | Freq: Four times a day (QID) | ORAL | Status: DC | PRN

## 2013-06-28 NOTE — Progress Notes (Signed)
  Subjective:    Patient ID: Janssen Zee, male    DOB: 12-May-1966, 47 y.o.   MRN: [id number]  HPI 5 day h/o excessive cough and chest congestion, was up all night last night with cough unable top produce anything, no documented fever, has had chills, apetitie and energy level somewhat depressed since illness and worsening as time goes on   Review of Systems See HPI Mother provides history pt mentally challenged and has expressive aphasia . Denies sinus pressure, nasal congestion, ear pain or sore throat.  Denies chest pains, palpitations and leg swelling Denies abdominal pain, nausea, vomiting,diarrhea or constipation.   Denies dysuria, frequency, hesitancy or incontinence. Denies joint pain, swelling and limitation in mobility.  Denies uncontrolle ddepression, anxiety or insomnia. Denies skin break down or rash.        Objective:   Physical Exam  Patient alert and oriented and in no cardiopulmonary distress.  HEENT: No facial asymmetry, EOMI, no sinus tenderness,  oropharynx pink and moist.  Neck supple no adenopathy.  Chest: decreased air entry, scattered crackles, no wheezes  CVS: S1, S2 no murmurs, no S3.  ABD: Soft non tender. Bowel sounds normal.  Ext: No edema  MS: Adequate ROM spine, shoulders, hips and knees.  Skin: Intact, no ulcerations or rash noted.    CNS: CN 2-12 intact, power, t normal throughout.       Assessment & Plan:

## 2013-06-28 NOTE — Patient Instructions (Signed)
Cancel appt this week, reschedule for next week    You are treated for acute bronchitis, penicillin and tessalon perles are sent to the pharmacy  Pls get CXR tomorrow

## 2013-06-29 ENCOUNTER — Ambulatory Visit (HOSPITAL_COMMUNITY)
Admission: RE | Admit: 2013-06-29 | Discharge: 2013-06-29 | Disposition: A | Discharge status: Home / Self Care | Payer: Medicare Other | Source: Ambulatory Visit | Attending: Family Medicine | Admitting: Family Medicine

## 2013-06-29 DIAGNOSIS — R05 Cough: Secondary | ICD-10-CM | POA: Diagnosis not present

## 2013-06-29 DIAGNOSIS — J209 Acute bronchitis, unspecified: Secondary | ICD-10-CM

## 2013-06-29 DIAGNOSIS — R059 Cough, unspecified: Secondary | ICD-10-CM | POA: Diagnosis not present

## 2013-06-30 ENCOUNTER — Ambulatory Visit: Payer: Medicare Other | Admitting: Family Medicine

## 2013-07-06 ENCOUNTER — Ambulatory Visit: Payer: Medicare Other | Admitting: Family Medicine

## 2013-07-06 DIAGNOSIS — I10 Essential (primary) hypertension: Secondary | ICD-10-CM | POA: Diagnosis not present

## 2013-07-06 DIAGNOSIS — E039 Hypothyroidism, unspecified: Secondary | ICD-10-CM | POA: Diagnosis not present

## 2013-07-06 DIAGNOSIS — E061 Subacute thyroiditis: Secondary | ICD-10-CM | POA: Diagnosis not present

## 2013-07-06 DIAGNOSIS — E559 Vitamin D deficiency, unspecified: Secondary | ICD-10-CM | POA: Diagnosis not present

## 2013-07-07 DIAGNOSIS — E785 Hyperlipidemia, unspecified: Secondary | ICD-10-CM | POA: Diagnosis not present

## 2013-07-07 LAB — COMPREHENSIVE METABOLIC PANEL
ALT: 45 U/L (ref 0–53)
Albumin: 4.2 g/dL (ref 3.5–5.2)
Alkaline Phosphatase: 72 U/L (ref 39–117)
Glucose, Bld: 91 mg/dL (ref 70–99)
Potassium: 4.3 mEq/L (ref 3.5–5.3)
Sodium: 137 mEq/L (ref 135–145)
Total Bilirubin: 0.3 mg/dL (ref 0.3–1.2)
Total Protein: 6.9 g/dL (ref 6.0–8.3)

## 2013-07-07 LAB — LIPID PANEL
HDL: 37 mg/dL — ABNORMAL LOW (ref >39)
LDL Cholesterol: 90 mg/dL (ref 0–99)
Total CHOL/HDL Ratio: 3.8 Ratio

## 2013-07-08 ENCOUNTER — Ambulatory Visit (INDEPENDENT_AMBULATORY_CARE_PROVIDER_SITE_OTHER): Payer: Medicare Other | Admitting: Family Medicine

## 2013-07-08 ENCOUNTER — Encounter: Payer: Self-pay | Admitting: Family Medicine

## 2013-07-08 VITALS — BP 110/68 | HR 90 | Resp 18 | Ht 67.0 in | Wt 183.1 lb

## 2013-07-08 DIAGNOSIS — J209 Acute bronchitis, unspecified: Secondary | ICD-10-CM | POA: Diagnosis not present

## 2013-07-08 DIAGNOSIS — F29 Unspecified psychosis not due to a substance or known physiological condition: Secondary | ICD-10-CM

## 2013-07-08 DIAGNOSIS — E079 Disorder of thyroid, unspecified: Secondary | ICD-10-CM | POA: Diagnosis not present

## 2013-07-08 DIAGNOSIS — E785 Hyperlipidemia, unspecified: Secondary | ICD-10-CM

## 2013-07-08 DIAGNOSIS — I10 Essential (primary) hypertension: Secondary | ICD-10-CM

## 2013-07-08 NOTE — Patient Instructions (Signed)
F/u in 4 month, call if you need me before  Keep active and keep eating healthily

## 2013-07-08 NOTE — Progress Notes (Signed)
  Subjective:    Patient ID: Quantay Zaremba, male    DOB: 03/25/1966, 47 y.o.   MRN: [id number]  HPI  The PT is here for follow up and re-evaluation of chronic medical conditions, medication management and review of any available recent lab and radiology data.  Preventive health is updated, specifically  Cancer screening and Immunization.   Questions or concerns regarding consultations or procedures which the PT has had in the interim are  Addressed.Stil;l sees psychiatry regularly, no recent deterioration in mental health. Also follows with endo, and neurology The PT denies any adverse reactions to current medications since the last visit.  Head and chest congestion for which he was recently treated are now cleared fully per Mom, activity is back to baseline as is his appetite     Review of Systems See HPI, provided by  mother Denies recent fever or chills. Denies sinus pressure, nasal congestion, ear pain or sore throat. Denies chest congestion, productive cough or wheezing. Denies chest pains, palpitations and leg swelling Denies abdominal pain, nausea, vomiting,diarrhea or constipation.   Denies dysuria, frequency, hesitancy or incontinence. Denies joint pain, swelling and limitation in mobility. Denies headaches, seizures, numbness, or tingling. Denies uncontrolled  depression, anxiety or insomnia.Denies hallucinations or erratic behavior Denies skin break down or rash.        Objective:   Physical Exam  Patient alert  and in no cardiopulmonary distress.  HEENT: No facial asymmetry, EOMI, no sinus tenderness,  oropharynx pink and moist.  Neck supple no adenopathy.  Chest: Clear to auscultation bilaterally.  CVS: S1, S2 no murmurs, no S3.  ABD: Soft non tender. Bowel sounds normal.  Ext: No edema  MS: Adequate ROM spine, shoulders, hips and knees.  Skin: Intact, no ulcerations or rash noted.  Psych: Good eye contact,blunted  affect.  not anxious or depressed  appearing.  CNS: CN 2-12 intact, power, tone and sensation normal throughout.       Assessment & Plan:

## 2013-07-09 NOTE — Assessment & Plan Note (Signed)
Controlled, no change in medication DASH diet and commitment to daily physical activity for a minimum of 30 minutes discussed and encouraged, as a part of hypertension management. The importance of attaining a healthy weight is also discussed.  

## 2013-07-09 NOTE — Assessment & Plan Note (Signed)
Controlled, no change in medication Hyperlipidemia:Low fat diet discussed and encouraged.  Increased physical activity recommended

## 2013-07-09 NOTE — Assessment & Plan Note (Signed)
Followed by endo.  

## 2013-07-09 NOTE — Assessment & Plan Note (Signed)
Stable, followed by mental health. 

## 2013-07-09 NOTE — Assessment & Plan Note (Signed)
resolved 

## 2013-07-19 ENCOUNTER — Other Ambulatory Visit: Payer: Self-pay | Admitting: Family Medicine

## 2013-07-25 NOTE — Assessment & Plan Note (Signed)
Controlled, no change in medication  

## 2013-07-25 NOTE — Assessment & Plan Note (Signed)
antibiuotic and decongestant prescribed. cXR asap

## 2013-08-02 DIAGNOSIS — F919 Conduct disorder, unspecified: Secondary | ICD-10-CM | POA: Diagnosis not present

## 2013-08-29 ENCOUNTER — Other Ambulatory Visit: Payer: Self-pay | Admitting: Family Medicine

## 2013-09-06 DIAGNOSIS — Z79899 Other long term (current) drug therapy: Secondary | ICD-10-CM | POA: Diagnosis not present

## 2013-09-06 DIAGNOSIS — R259 Unspecified abnormal involuntary movements: Secondary | ICD-10-CM | POA: Diagnosis not present

## 2013-11-09 ENCOUNTER — Ambulatory Visit (INDEPENDENT_AMBULATORY_CARE_PROVIDER_SITE_OTHER): Payer: Medicare Other | Admitting: Family Medicine

## 2013-11-09 ENCOUNTER — Encounter: Payer: Self-pay | Admitting: Family Medicine

## 2013-11-09 ENCOUNTER — Encounter (INDEPENDENT_AMBULATORY_CARE_PROVIDER_SITE_OTHER): Payer: Self-pay

## 2013-11-09 VITALS — BP 126/78 | HR 98 | Resp 18 | Ht 67.0 in | Wt 178.0 lb

## 2013-11-09 DIAGNOSIS — E079 Disorder of thyroid, unspecified: Secondary | ICD-10-CM | POA: Diagnosis not present

## 2013-11-09 DIAGNOSIS — I10 Essential (primary) hypertension: Secondary | ICD-10-CM | POA: Diagnosis not present

## 2013-11-09 DIAGNOSIS — R259 Unspecified abnormal involuntary movements: Secondary | ICD-10-CM | POA: Diagnosis not present

## 2013-11-09 DIAGNOSIS — E785 Hyperlipidemia, unspecified: Secondary | ICD-10-CM | POA: Diagnosis not present

## 2013-11-09 DIAGNOSIS — Z125 Encounter for screening for malignant neoplasm of prostate: Secondary | ICD-10-CM | POA: Diagnosis not present

## 2013-11-09 DIAGNOSIS — F29 Unspecified psychosis not due to a substance or known physiological condition: Secondary | ICD-10-CM

## 2013-11-09 NOTE — Patient Instructions (Signed)
Annual physical in 4 month, call if you need me before  Fasting labs end March, pSA, lipid, cmp , cBC  Congrats on weight loss, keep it up!

## 2013-12-09 DIAGNOSIS — E785 Hyperlipidemia, unspecified: Secondary | ICD-10-CM | POA: Diagnosis not present

## 2013-12-09 DIAGNOSIS — I10 Essential (primary) hypertension: Secondary | ICD-10-CM | POA: Diagnosis not present

## 2013-12-09 DIAGNOSIS — Z125 Encounter for screening for malignant neoplasm of prostate: Secondary | ICD-10-CM | POA: Diagnosis not present

## 2013-12-10 LAB — CBC
HEMATOCRIT: 43.3 % (ref 39.0–52.0)
Hemoglobin: 14.3 g/dL (ref 13.0–17.0)
MCH: 29.1 pg (ref 26.0–34.0)
MCHC: 33 g/dL (ref 30.0–36.0)
MCV: 88.2 fL (ref 78.0–100.0)
Platelets: 281 10*3/uL (ref 150–400)
RBC: 4.91 MIL/uL (ref 4.22–5.81)
RDW: 14.2 % (ref 11.5–15.5)
WBC: 5.8 10*3/uL (ref 4.0–10.5)

## 2013-12-10 LAB — COMPREHENSIVE METABOLIC PANEL
ALT: 25 U/L (ref 0–53)
AST: 26 U/L (ref 0–37)
Albumin: 4.5 g/dL (ref 3.5–5.2)
Alkaline Phosphatase: 67 U/L (ref 39–117)
BILIRUBIN TOTAL: 0.4 mg/dL (ref 0.2–1.2)
BUN: 11 mg/dL (ref 6–23)
CO2: 27 meq/L (ref 19–32)
CREATININE: 0.89 mg/dL (ref 0.50–1.35)
Calcium: 10.3 mg/dL (ref 8.4–10.5)
Chloride: 104 mEq/L (ref 96–112)
GLUCOSE: 87 mg/dL (ref 70–99)
Potassium: 4.4 mEq/L (ref 3.5–5.3)
Sodium: 138 mEq/L (ref 135–145)
Total Protein: 6.8 g/dL (ref 6.0–8.3)

## 2013-12-10 LAB — PSA, MEDICARE: PSA: 0.56 ng/mL (ref <=4.00)

## 2013-12-10 LAB — LIPID PANEL
CHOLESTEROL: 140 mg/dL (ref 0–200)
HDL: 40 mg/dL (ref >39)
LDL Cholesterol: 87 mg/dL (ref 0–99)
TRIGLYCERIDES: 65 mg/dL (ref <150)
Total CHOL/HDL Ratio: 3.5 Ratio
VLDL: 13 mg/dL (ref 0–40)

## 2013-12-18 ENCOUNTER — Other Ambulatory Visit: Payer: Self-pay | Admitting: Family Medicine

## 2013-12-27 DIAGNOSIS — F919 Conduct disorder, unspecified: Secondary | ICD-10-CM | POA: Diagnosis not present

## 2014-01-02 NOTE — Assessment & Plan Note (Signed)
Has had neurologic eval, problem markedly reduced

## 2014-01-02 NOTE — Assessment & Plan Note (Signed)
Clinically stable and treated by psychiatry

## 2014-01-02 NOTE — Assessment & Plan Note (Signed)
Followed by endo, on no medication

## 2014-01-02 NOTE — Assessment & Plan Note (Signed)
Controlled, no change in medication DASH diet and commitment to daily physical activity for a minimum of 30 minutes discussed and encouraged, as a part of hypertension management. The importance of attaining a healthy weight is also discussed.  

## 2014-01-02 NOTE — Progress Notes (Signed)
   Subjective:    Patient ID: Terry PippinsMark Soto, male    DOB: Oct 25, 1965, 48 y.o.   MRN: [id number]  HPI The PT is here for follow up and re-evaluation of chronic medical conditions, medication management and review of any available recent lab and radiology data.  Preventive health is updated, specifically  Cancer screening and Immunization.   Follows regularly with psychiatry, endo and intermittently with neurology The PT denies any adverse reactions to current medications since the last visit.  There are no new concerns.  There are no specific complaints       Review of Systems See HPI History provided by Mother , as pt is unable   Denies recent fever or chills. Denies sinus pressure, nasal congestion, ear pain or sore throat. Denies chest congestion, productive cough or wheezing. Denies chest pains, palpitations and leg swelling Denies abdominal pain, nausea, vomiting,diarrhea or constipation.    Denies joint pain, swelling and limitation in mobility. Denies headaches,  Denies skin break down or rash.        Objective:   Physical Exam BP 126/78  Pulse 98  Resp 18  Ht 5\' 7"  (1.702 m)  Wt 178 lb (80.74 kg)  BMI 27.87 kg/m2  SpO2 98% Patient alert  and in no cardiopulmonary distress.Mentally retarded with expressive aphasia, limitation in ability to provide history persists  HEENT: No facial asymmetry, EOMI, no sinus tenderness,  oropharynx pink and moist.  Neck supple no adenopathy.  Chest: Clear to auscultation bilaterally.  CVS: S1, S2 no murmurs, no S3.  ABD: Soft non tender. Bowel sounds normal.  Ext: No edema  MS: Adequate ROM spine, shoulders, hips and knees.  Skin: Intact, no ulcerations or rash noted.  Psych: Good eye contact, blunted  affect.  anxious or depressed appearing.  CNS: CN 2-12 intact, power, tone and sensation normal throughout.        Assessment & Plan:  HYPERTENSION Controlled, no change in medication DASH diet and commitment to  daily physical activity for a minimum of 30 minutes discussed and encouraged, as a part of hypertension management. The importance of attaining a healthy weight is also discussed.   THYROID STIMULATING HORMONE, ABNORMAL Followed by endo, on no medication  Abnormal involuntary movement Has had neurologic eval, problem markedly reduced  HYPERCALCEMIA Followed by endo  UNSPECIFIED PSYCHOSIS Clinically stable and treated by psychiatry

## 2014-01-02 NOTE — Assessment & Plan Note (Signed)
Followed by endo.  

## 2014-01-04 DIAGNOSIS — I10 Essential (primary) hypertension: Secondary | ICD-10-CM | POA: Diagnosis not present

## 2014-01-04 DIAGNOSIS — E559 Vitamin D deficiency, unspecified: Secondary | ICD-10-CM | POA: Diagnosis not present

## 2014-01-04 DIAGNOSIS — E061 Subacute thyroiditis: Secondary | ICD-10-CM | POA: Diagnosis not present

## 2014-02-07 DIAGNOSIS — H40019 Open angle with borderline findings, low risk, unspecified eye: Secondary | ICD-10-CM | POA: Diagnosis not present

## 2014-02-07 DIAGNOSIS — H04129 Dry eye syndrome of unspecified lacrimal gland: Secondary | ICD-10-CM | POA: Diagnosis not present

## 2014-02-07 DIAGNOSIS — H5200 Hypermetropia, unspecified eye: Secondary | ICD-10-CM | POA: Diagnosis not present

## 2014-02-07 DIAGNOSIS — H52229 Regular astigmatism, unspecified eye: Secondary | ICD-10-CM | POA: Diagnosis not present

## 2014-03-10 DIAGNOSIS — Z79899 Other long term (current) drug therapy: Secondary | ICD-10-CM | POA: Diagnosis not present

## 2014-03-10 DIAGNOSIS — R259 Unspecified abnormal involuntary movements: Secondary | ICD-10-CM | POA: Diagnosis not present

## 2014-03-14 ENCOUNTER — Other Ambulatory Visit: Payer: Self-pay

## 2014-03-14 ENCOUNTER — Encounter: Payer: Self-pay | Admitting: Family Medicine

## 2014-03-14 ENCOUNTER — Ambulatory Visit (INDEPENDENT_AMBULATORY_CARE_PROVIDER_SITE_OTHER): Payer: Medicare Other | Admitting: Family Medicine

## 2014-03-14 ENCOUNTER — Encounter (INDEPENDENT_AMBULATORY_CARE_PROVIDER_SITE_OTHER): Payer: Self-pay

## 2014-03-14 VITALS — BP 124/82 | HR 72 | Resp 16 | Ht 67.0 in | Wt 177.1 lb

## 2014-03-14 DIAGNOSIS — I10 Essential (primary) hypertension: Secondary | ICD-10-CM

## 2014-03-14 DIAGNOSIS — Z0000 Encounter for general adult medical examination without abnormal findings: Secondary | ICD-10-CM

## 2014-03-14 DIAGNOSIS — E785 Hyperlipidemia, unspecified: Secondary | ICD-10-CM

## 2014-03-14 MED ORDER — ROSUVASTATIN CALCIUM 40 MG PO TABS: ORAL_TABLET | ORAL | Status: DC

## 2014-03-14 MED ORDER — BENAZEPRIL HCL 20 MG PO TABS: ORAL_TABLET | ORAL | Status: DC

## 2014-03-14 NOTE — Patient Instructions (Signed)
Annual physical exam in November, call if you need me before  Fasting lipid, and cmp  For Novemeber visit  Call for flu vaccine in September  PLS Start walking for exercise, take Mom!

## 2014-03-14 NOTE — Progress Notes (Signed)
Subjective:    Patient ID: Terry PippinsMark Soto, male    DOB: 11-11-1965, 48 y.o.   MRN: [id number]  HPI Preventive Screening-Counseling & Management   Patient present here today for a subsequent  Medicare annual wellness visit.   Current Problems (verified)   Medications Prior to Visit Allergies (verified)   PAST HISTORY  Family History reviewed  Social History : only child lives with his mother, never worked due to mild to moderate MR, also has h/o mental illness. Never alcohol or drug use, no children no h/o sexual relationships   Risk Factors  Current exercise habits:  Irregular on avg 2 times weekly, goal to increase to  Days week;ly discussed and encouraged  Dietary issues discussed:salt, fat and carbohydrated limited , rich in unprocessed fruit and vegetable fresh or frozen   Cardiac risk factors: none significant  Depression Screen  (Note: if answer to either of the following is "Yes", a more complete depression screening is indicated)   Over the past two weeks, have you felt down, depressed or hopeless? No  Over the past two weeks, have you felt little interest or pleasure in doing things? No  Have you lost interest or pleasure in daily life? No  Do you often feel hopeless? No  Do you cry easily over simple problems? No   Activities of Daily Living  In your present state of health, do you have any difficulty performing the following activities?  Driving?: No Managing money?: No Feeding yourself?:No Getting from bed to chair?:No Climbing a flight of stairs?:No Preparing food and eating?:No Bathing or showering?:No Getting dressed?:No Getting to the toilet?:No Using the toilet?:No Moving around from place to place?: No  Fall Risk Assessment In the past year have you fallen or had a near fall?:No Are you currently taking any medications that make you dizziness?:No   Hearing Difficulties: No Do you often ask people to speak up or repeat themselves?:No Do you  experience ringing or noises in your ears?:No Do you have difficulty understanding soft or whispered voices?:No  Cognitive Testing  Alert? Yes Normal Appearance?Yes  Oriented to person? Yes Place? Yes  Time? Yes  Displays appropriate judgment?Yes  Can read the correct time from a watch face? yes Are you having problems remembering things?No  Advanced Directives have been discussed with the patient?Yes    List the Names of Other Physician/Practitioners you currently use: see care list   Indicate any recent Medical Services you may have received from other than [facility] providers in the past year (date may be approximate).   Assessment:    Annual Wellness Exam   Plan:      Patient Instructions (the written plan) was given to the patient.  Medicare Attestation  I have personally reviewed:  The patient's medical and social history  Their use of alcohol, tobacco or illicit drugs  Their current medications and supplements  The patient's functional ability including ADLs,fall risks, home safety risks, cognitive, and hearing and visual impairment  Diet and physical activities  Evidence for depression or mood disorders  The patient's weight, height, BMI, and visual acuity have been recorded in the chart. I have made referrals, counseling, and provided education to the patient based on review of the above and I have provided the patient with a written personalized care plan for preventive services.      Review of Systems     Objective:   Physical Exam BP 124/82  Pulse 72  Resp 16  Ht 5\' 7"  (1.702  m)  Wt 177 lb 1.9 oz (80.341 kg)  BMI 27.73 kg/m2  SpO2 99%        Assessment & Plan:  Medicare annual wellness visit, subsequent Annual exam as documented. Counseling done  re healthy lifestyle involving commitment to 150 minutes exercise per week, heart healthy diet, and attaining healthy weight.The importance of adequate sleep also discussed. Regular seat belt use and home  safety, is also discussed. Changes in health habits are decided on by the patient with goals and time frames  set for achieving them. Immunization and cancer screening needs are specifically addressed at this visit.

## 2014-03-20 ENCOUNTER — Other Ambulatory Visit: Payer: Self-pay | Admitting: Family Medicine

## 2014-05-17 ENCOUNTER — Ambulatory Visit (INDEPENDENT_AMBULATORY_CARE_PROVIDER_SITE_OTHER): Payer: Medicare Other

## 2014-05-17 DIAGNOSIS — Z23 Encounter for immunization: Secondary | ICD-10-CM | POA: Diagnosis not present

## 2014-06-01 DIAGNOSIS — F919 Conduct disorder, unspecified: Secondary | ICD-10-CM | POA: Diagnosis not present

## 2014-06-26 ENCOUNTER — Encounter: Payer: Self-pay | Admitting: Family Medicine

## 2014-06-26 DIAGNOSIS — Z0000 Encounter for general adult medical examination without abnormal findings: Secondary | ICD-10-CM | POA: Insufficient documentation

## 2014-06-26 NOTE — Assessment & Plan Note (Signed)

## 2014-07-05 DIAGNOSIS — I10 Essential (primary) hypertension: Secondary | ICD-10-CM | POA: Diagnosis not present

## 2014-07-05 DIAGNOSIS — E061 Subacute thyroiditis: Secondary | ICD-10-CM | POA: Diagnosis not present

## 2014-07-14 DIAGNOSIS — E785 Hyperlipidemia, unspecified: Secondary | ICD-10-CM | POA: Diagnosis not present

## 2014-07-14 DIAGNOSIS — I10 Essential (primary) hypertension: Secondary | ICD-10-CM | POA: Diagnosis not present

## 2014-07-15 LAB — COMPREHENSIVE METABOLIC PANEL
ALK PHOS: 78 U/L (ref 39–117)
ALT: 30 U/L (ref 0–53)
AST: 24 U/L (ref 0–37)
Albumin: 4.4 g/dL (ref 3.5–5.2)
BILIRUBIN TOTAL: 0.4 mg/dL (ref 0.2–1.2)
BUN: 7 mg/dL (ref 6–23)
CO2: 30 mEq/L (ref 19–32)
CREATININE: 0.86 mg/dL (ref 0.50–1.35)
Calcium: 10.7 mg/dL — ABNORMAL HIGH (ref 8.4–10.5)
Chloride: 101 mEq/L (ref 96–112)
GLUCOSE: 92 mg/dL (ref 70–99)
Potassium: 4.3 mEq/L (ref 3.5–5.3)
SODIUM: 138 meq/L (ref 135–145)
TOTAL PROTEIN: 7.1 g/dL (ref 6.0–8.3)

## 2014-07-15 LAB — LIPID PANEL
CHOL/HDL RATIO: 3.3 ratio
Cholesterol: 136 mg/dL (ref 0–200)
HDL: 41 mg/dL (ref >39)
LDL CALC: 81 mg/dL (ref 0–99)
Triglycerides: 69 mg/dL (ref <150)
VLDL: 14 mg/dL (ref 0–40)

## 2014-07-18 ENCOUNTER — Encounter (INDEPENDENT_AMBULATORY_CARE_PROVIDER_SITE_OTHER): Payer: Self-pay

## 2014-07-18 ENCOUNTER — Ambulatory Visit (INDEPENDENT_AMBULATORY_CARE_PROVIDER_SITE_OTHER): Payer: Medicare Other | Admitting: Family Medicine

## 2014-07-18 ENCOUNTER — Encounter: Payer: Self-pay | Admitting: Family Medicine

## 2014-07-18 VITALS — BP 122/84 | HR 82 | Resp 16 | Ht 67.0 in | Wt 177.0 lb

## 2014-07-18 DIAGNOSIS — Z1211 Encounter for screening for malignant neoplasm of colon: Secondary | ICD-10-CM | POA: Diagnosis not present

## 2014-07-18 DIAGNOSIS — R195 Other fecal abnormalities: Secondary | ICD-10-CM | POA: Diagnosis not present

## 2014-07-18 DIAGNOSIS — Z0000 Encounter for general adult medical examination without abnormal findings: Secondary | ICD-10-CM | POA: Diagnosis not present

## 2014-07-18 LAB — CBC
HEMATOCRIT: 43.9 % (ref 39.0–52.0)
Hemoglobin: 14.8 g/dL (ref 13.0–17.0)
MCH: 29.5 pg (ref 26.0–34.0)
MCHC: 33.7 g/dL (ref 30.0–36.0)
MCV: 87.5 fL (ref 78.0–100.0)
MPV: 9.9 fL (ref 9.4–12.4)
PLATELETS: 294 10*3/uL (ref 150–400)
RBC: 5.02 MIL/uL (ref 4.22–5.81)
RDW: 14 % (ref 11.5–15.5)
WBC: 7.7 10*3/uL (ref 4.0–10.5)

## 2014-07-18 LAB — HEMOCCULT GUIAC POC 1CARD (OFFICE): Fecal Occult Blood, POC: POSITIVE

## 2014-07-18 NOTE — Patient Instructions (Signed)
F/u in Jan , call if you need me before  CBc today  You are referred to Dr Lovell SheehanJenkins since you have hidden blood in your stool which is new  Eat a lot of fresh fruit and vegetable, less, red meat and fried food and do not strain at stool

## 2014-07-18 NOTE — Assessment & Plan Note (Signed)

## 2014-07-18 NOTE — Assessment & Plan Note (Signed)
Heme positive stool, first time, refer for colon screen

## 2014-07-18 NOTE — Progress Notes (Signed)
   Subjective:    Patient ID: Terry PippinsMark Soto, male    DOB: Apr 09, 1966, 48 y.o.   MRN: [id number]  HPI Patient is in for annual physical exam. No other health concerns are expressed or addressed at the visit.    Review of Systems See HPI Denies recent fever or chills. Denies sinus pressure, nasal congestion, ear pain or sore throat. Denies chest congestion, productive cough or wheezing. Denies chest pains, palpitations and leg swelling Denies abdominal pain, nausea, vomiting,diarrhea or constipation.   Denies dysuria, frequency, hesitancy or incontinence. Denies joint pain, swelling and limitation in mobility. Denies headaches, seizures, numbness, or tingling. Denies depression, anxiety or insomnia. Denies skin break down or rash.        Objective:   Physical Exam  BP 122/84 mmHg  Pulse 82  Resp 16  Ht 5\' 7"  (1.702 m)  Wt 177 lb (80.287 kg)  BMI 27.72 kg/m2  SpO2 100%  Pleasant well nourished male, alert and oriented x 3, in no cardio-pulmonary distress. Afebrile. HEENT No facial trauma or asymetry. Sinuses non tender. EOMI, PERTL, fundoscopic exam is negative for hemorhages or exudates. External ears normal, tympanic membranes clear. Oropharynx moist, no exudate, good dentition. Neck: supple, no adenopathy,JVD or thyromegaly.No bruits.  Chest: Clear to ascultation bilaterally.No crackles or wheezes. Non tender to palpation  Breast: No asymetry,no masses. No nipple discharge or inversion. No axillary or supraclavicular adenopathy  Cardiovascular system; Heart sounds normal,  S1 and  S2 ,no S3.  No murmur, or thrill. Apical beat not displaced Peripheral pulses normal.  Abdomen: Soft, non tender, no organomegaly or masses. No bruits. Bowel sounds normal. No guarding, tenderness or rebound.  Rectal:  Normal sphincter tone. No hemorrhoids or  masses. guaiac positive stool. Prostate smooth and firm  Musculoskeletal exam: Full ROM of spine, hips , shoulders  and knees. No deformity ,swelling or crepitus noted. No muscle wasting or atrophy.   Neurologic: Cranial nerves 2 to 12 intact. Power, tone ,sensation and reflexes normal throughout. No disturbance in gait. No tremor.  Skin: Intact, no ulceration, erythema , scaling or rash noted. Pigmentation normal throughout  Psych; Normal mood and affect. Judgement and concentration normal         Assessment & Plan:  Annual physical exam Annual exam as documented. Counseling done  re healthy lifestyle involving commitment to 150 minutes exercise per week, heart healthy diet, and attaining healthy weight.The importance of adequate sleep also discussed. Regular seat belt use and home safety, is also discussed. Changes in health habits are decided on by the patient with goals and time frames  set for achieving them. Immunization and cancer screening needs are specifically addressed at this visit.   Heme positive stool Heme positive stool, first time, refer for colon screen

## 2014-07-19 ENCOUNTER — Telehealth: Payer: Self-pay | Admitting: Family Medicine

## 2014-07-19 NOTE — Telephone Encounter (Signed)
Attempted to call patient back no answer received

## 2014-07-26 DIAGNOSIS — K921 Melena: Secondary | ICD-10-CM | POA: Diagnosis not present

## 2014-07-27 NOTE — H&P (Signed)
  NTS SOAP Note  Vital Signs:  Vitals as of: 07/26/2014: Systolic 115: Diastolic 77: Heart Rate 113: Temp 98.76F: Height 775ft 7in: Weight 175Lbs 0 Ounces: BMI 27.41  BMI : 27.41 kg/m2  Subjective: This 48 year old male presents forheme positive stools.  Referred for colonoscopy.  History limited secondary to mental deficiency.  No bright red blood per rectum.  No family h/o colon carcinoma.  Review of Symptoms:  Constitutional:unremarkable   Head:unremarkable Eyes:unremarkable   sinus problems Cardiovascular:  unremarkable Respiratory:unremarkable Gastrointestinal:  unremarkable   Genitourinary:unremarkable   Musculoskeletal:unremarkable Skin:unremarkable Hematolgic/Lymphatic:unremarkable   hay fever   Past Medical History:  Reviewed  Past Medical History  Surgical History: none Medical Problems: high cholesterol,  mentally deficient Allergies: nkda Medications: baby asa,  risperadone,  crestor,  chlorpromaz,  benepril    Social History:Reviewed  Social History  Preferred Language: English Race:  Black or African American Ethnicity: Not Hispanic / Latino Age: [age] year Marital Status:  S Alcohol: no   Smoking Status: Never smoker reviewed on 07/26/2014 Functional Status reviewed on 07/26/2014 ------------------------------------------------ Bathing: Normal Cooking: Normal Dressing: Normal Driving: Normal Eating: Normal Managing Meds: Normal Oral Care: Normal Shopping: Normal Toileting: Normal Transferring: Normal Walking: Normal Cognitive Status reviewed on 07/26/2014 ------------------------------------------------ Attention: Disablilty Decision Making: Disablilty Language: Normal Memory: Normal Motor: Normal Perception: Normal Problem Solving: Normal Visual and Spatial: Normal   Family History:Reviewed  Family Health History Mother, Living; Healthy;  Father, Living; Healthy;     Objective Information: General:Well  appearing, well nourished in no distress. Heart:RRR, no murmur or gallop.  Normal S1, S2.  No S3, S4.  Lungs:  CTA bilaterally, no wheezes, rhonchi, rales.  Breathing unlabored. Abdomen:Soft, NT/ND, no HSM, no masses. deferred to procedure  Assessment:  Blood in stools  Diagnoses: 578.1  K92.1 Hematochezia (Melena)  Procedures: 1610999202 - OFFICE OUTPATIENT NEW 20 MINUTES    Plan:  Scheduled for TCS on 08/09/14.   Patient Education:Alternative treatments to surgery were discussed with patient (and family).  Risks and benefits  of procedure including bleeding and perforation were fully explained to the patient (and caregiver) who gave informed consent. Patient/family questions were addressed.  Follow-up:Pending Surgery

## 2014-08-09 ENCOUNTER — Encounter (HOSPITAL_COMMUNITY)
Admission: RE | Disposition: A | Discharge status: Home / Self Care | Payer: Self-pay | Source: Ambulatory Visit | Attending: General Surgery

## 2014-08-09 ENCOUNTER — Encounter (HOSPITAL_COMMUNITY): Payer: Self-pay | Admitting: *Deleted

## 2014-08-09 ENCOUNTER — Ambulatory Visit (HOSPITAL_COMMUNITY)
Admission: RE | Admit: 2014-08-09 | Discharge: 2014-08-09 | Disposition: A | Discharge status: Home / Self Care | Payer: Medicare Other | Source: Ambulatory Visit | Attending: General Surgery | Admitting: General Surgery

## 2014-08-09 DIAGNOSIS — E780 Pure hypercholesterolemia: Secondary | ICD-10-CM | POA: Insufficient documentation

## 2014-08-09 DIAGNOSIS — K5730 Diverticulosis of large intestine without perforation or abscess without bleeding: Secondary | ICD-10-CM | POA: Insufficient documentation

## 2014-08-09 DIAGNOSIS — K5731 Diverticulosis of large intestine without perforation or abscess with bleeding: Secondary | ICD-10-CM | POA: Diagnosis not present

## 2014-08-09 DIAGNOSIS — K921 Melena: Secondary | ICD-10-CM | POA: Diagnosis not present

## 2014-08-09 HISTORY — PX: COLONOSCOPY: SHX5424

## 2014-08-09 SURGERY — COLONOSCOPY
Anesthesia: Moderate Sedation

## 2014-08-09 MED ORDER — MEPERIDINE HCL 50 MG/ML IJ SOLN
INTRAMUSCULAR | Status: DC | PRN
  Administered 2014-08-09: 50 mg via INTRAVENOUS

## 2014-08-09 MED ORDER — MIDAZOLAM HCL 5 MG/5ML IJ SOLN
INTRAMUSCULAR | Status: AC
  Filled 2014-08-09: qty 10

## 2014-08-09 MED ORDER — MEPERIDINE HCL 50 MG/ML IJ SOLN
INTRAMUSCULAR | Status: AC
  Filled 2014-08-09: qty 1

## 2014-08-09 MED ORDER — SODIUM CHLORIDE 0.9 % IV SOLN
INTRAVENOUS | Status: DC
  Administered 2014-08-09: 08:00:00 via INTRAVENOUS

## 2014-08-09 MED ORDER — MIDAZOLAM HCL 5 MG/5ML IJ SOLN
INTRAMUSCULAR | Status: DC | PRN
  Administered 2014-08-09: 3 mg via INTRAVENOUS
  Administered 2014-08-09: 1 mg via INTRAVENOUS

## 2014-08-09 MED ORDER — STERILE WATER FOR IRRIGATION IR SOLN
Status: DC | PRN
  Administered 2014-08-09: 09:00:00

## 2014-08-09 NOTE — Op Note (Signed)
[facility] [address] [place], [id number]   COLONOSCOPY PROCEDURE REPORT     EXAM DATE: 08/09/2014  PATIENT NAME:      Terry Soto, Terry Soto           MR #:      [id number] BIRTHDATE:       07/31/1966      VISIT #:     [id number]  ATTENDING:     Franky MachoMark Levonne Carreras, MD     STATUS:     outpatient REFERRING MD:      Syliva OvermanMargaret Simpson, M.D. ASA CLASS:        Class II  INDICATIONS:  The patient is a 48 yr old male here for a colonoscopy due to heme-positive stool. PROCEDURE PERFORMED:     Colonoscopy, diagnostic MEDICATIONS:     Demerol 50 mg IV and Versed 4 mg IV ESTIMATED BLOOD LOSS:     None  CONSENT: The patient understands the risks and benefits of the procedure and understands that these risks include, but are not limited to: sedation, allergic reaction, infection, perforation and/or bleeding. Alternative means of evaluation and treatment include, among others: physical exam, x-rays, and/or surgical intervention. The patient elects to proceed with this endoscopic procedure.  DESCRIPTION OF PROCEDURE: During intra-op preparation period all mechanical & medical equipment was checked for proper function. Hand hygiene and appropriate measures for infection prevention was taken. After the risks, benefits and alternatives of the procedure were thoroughly explained, Informed consent was verified, confirmed and timeout was successfully executed by the treatment team. A digital exam revealed no abnormalities of the rectum.      The [id number] ([id number] endoscope was introduced through the anus and advanced to the cecum, which was identified by both the appendix and ileocecal valve. No adverse events experienced. The prep was adequate, using Trilyte. The instrument was then slowly withdrawn as the colon was fully examined.   COLON FINDINGS: There was moderate diverticulosis noted in the sigmoid colon and descending colon.   The examination was otherwise normal. Retroflexed  views revealed no abnormalities.  The scope was then completely withdrawn from the patient and the procedure terminated. WITHDRAWAL TIME: 6 minutes 0 seconds    ADVERSE EVENTS:      There were no immediate complications.  IMPRESSIONS:     1.  Moderate diverticulosis was noted in the sigmoid colon and descending colon 2.  The examination was otherwise normal  RECOMMENDATIONS:     Repeat Colonscopy in 10 years. RECALL:  Franky MachoMark Tamikka Pilger, MD eSigned:  Franky MachoMark Darral Rishel, MD 08/09/2014 8:55 AM   cc:  CPT CODES: ICD CODES:  The ICD and CPT codes recommended by this software are interpretations from the data that the clinical staff has captured with the software.  The verification of the translation of this report to the ICD and CPT codes and modifiers is the sole responsibility of the health care institution and practicing physician where this report was generated.  [employer] will not be held responsible for the validity of the ICD and CPT codes included on this report.  AMA assumes no liability for data contained or not contained herein. CPT is a Publishing rights managerregistered trademark of the [employer].

## 2014-08-09 NOTE — Discharge Instructions (Signed)
Diverticulosis °Diverticulosis is the condition that develops when small pouches (diverticula) form in the wall of your colon. Your colon, or large intestine, is where water is absorbed and stool is formed. The pouches form when the inside layer of your colon pushes through weak spots in the outer layers of your colon. °CAUSES  °No one knows exactly what causes diverticulosis. °RISK FACTORS °· Being older than 50. Your risk for this condition increases with age. Diverticulosis is rare in people younger than 40 years. By age 80, almost everyone has it. °· Eating a low-fiber diet. °· Being frequently constipated. °· Being overweight. °· Not getting enough exercise. °· Smoking. °· Taking over-the-counter pain medicines, like aspirin and ibuprofen. °SYMPTOMS  °Most people with diverticulosis do not have symptoms. °DIAGNOSIS  °Because diverticulosis often has no symptoms, health care providers often discover the condition during an exam for other colon problems. In many cases, a health care provider will diagnose diverticulosis while using a flexible scope to examine the colon (colonoscopy). °TREATMENT  °If you have never developed an infection related to diverticulosis, you may not need treatment. If you have had an infection before, treatment may include: °· Eating more fruits, vegetables, and grains. °· Taking a fiber supplement. °· Taking a live bacteria supplement (probiotic). °· Taking medicine to relax your colon. °HOME CARE INSTRUCTIONS  °· Drink at least 6-8 glasses of water each day to prevent constipation. °· Try not to strain when you have a bowel movement. °· Keep all follow-up appointments. °If you have had an infection before:  °· Increase the fiber in your diet as directed by your health care provider or dietitian. °· Take a dietary fiber supplement if your health care provider approves. °· Only take medicines as directed by your health care provider. °SEEK MEDICAL CARE IF:  °· You have abdominal  pain. °· You have bloating. °· You have cramps. °· You have not gone to the bathroom in 3 days. °SEEK IMMEDIATE MEDICAL CARE IF:  °· Your pain gets worse. °· Your bloating becomes very bad. °· You have a fever or chills, and your symptoms suddenly get worse. °· You begin vomiting. °· You have bowel movements that are bloody or black. °MAKE SURE YOU: °· Understand these instructions. °· Will watch your condition. °· Will get help right away if you are not doing well or get worse. °Document Released: 05/16/2004 Document Revised: 08/24/2013 Document Reviewed: 07/14/2013 °Exit[employer]® Patient Information ©2015 [employer]. This information is not intended to replace advice given to you by your health care provider. Make sure you discuss any questions you have with your health care provider. °Colonoscopy, Care After °Refer to this sheet in the next few weeks. These instructions provide you with information on caring for yourself after your procedure. Your health care provider may also give you more specific instructions. Your treatment has been planned according to current medical practices, but problems sometimes occur. Call your health care provider if you have any problems or questions after your procedure. °WHAT TO EXPECT AFTER THE PROCEDURE  °After your procedure, it is typical to have the following: °· A small amount of blood in your stool. °· Moderate amounts of gas and mild abdominal cramping or bloating. °HOME CARE INSTRUCTIONS °· Do not drive, operate machinery, or sign important documents for 24 hours. °· You may shower and resume your regular physical activities, but move at a slower pace for the first 24 hours. °· Take frequent rest periods for the first 24 hours. °· Walk   around or put a warm pack on your abdomen to help reduce abdominal cramping and bloating. °· Drink enough fluids to keep your urine clear or pale yellow. °· You may resume your normal diet as instructed by your health care provider. Avoid  heavy or fried foods that are hard to digest. °· Avoid drinking alcohol for 24 hours or as instructed by your health care provider. °· Only take over-the-counter or prescription medicines as directed by your health care provider. °· If a tissue sample (biopsy) was taken during your procedure: °¨ Do not take aspirin or blood thinners for 7 days, or as instructed by your health care provider. °¨ Do not drink alcohol for 7 days, or as instructed by your health care provider. °¨ Eat soft foods for the first 24 hours. °SEEK MEDICAL CARE IF: °You have persistent spotting of blood in your stool 2-3 days after the procedure. °SEEK IMMEDIATE MEDICAL CARE IF: °· You have more than a small spotting of blood in your stool. °· You pass large blood clots in your stool. °· Your abdomen is swollen (distended). °· You have nausea or vomiting. °· You have a fever. °· You have increasing abdominal pain that is not relieved with medicine. °Document Released: 04/02/2004 Document Revised: 06/09/2013 Document Reviewed: 04/26/2013 °Exit[employer]® Patient Information ©2015 [employer]. This information is not intended to replace advice given to you by your health care provider. Make sure you discuss any questions you have with your health care provider. ° °

## 2014-08-09 NOTE — Interval H&P Note (Signed)
History and Physical Interval Note:  08/09/2014 8:27 AM  Terry Soto  has presented today for surgery, with the diagnosis of blood in stool  The various methods of treatment have been discussed with the patient and family. After consideration of risks, benefits and other options for treatment, the patient has consented to  Procedure(s) with comments: COLONOSCOPY (N/A) - 845 as a surgical intervention .  The patient's history has been reviewed, patient examined, no change in status, stable for surgery.  I have reviewed the patient's chart and labs.  Questions were answered to the patient's satisfaction.     Franky MachoJENKINS,Demba A

## 2014-08-11 ENCOUNTER — Encounter (HOSPITAL_COMMUNITY): Payer: Self-pay | Admitting: General Surgery

## 2014-08-24 ENCOUNTER — Other Ambulatory Visit: Payer: Self-pay | Admitting: Family Medicine

## 2014-09-12 DIAGNOSIS — Z79899 Other long term (current) drug therapy: Secondary | ICD-10-CM | POA: Diagnosis not present

## 2014-09-12 DIAGNOSIS — R251 Tremor, unspecified: Secondary | ICD-10-CM | POA: Diagnosis not present

## 2014-09-19 ENCOUNTER — Ambulatory Visit (INDEPENDENT_AMBULATORY_CARE_PROVIDER_SITE_OTHER): Payer: Medicare Other | Admitting: Family Medicine

## 2014-09-19 ENCOUNTER — Encounter (INDEPENDENT_AMBULATORY_CARE_PROVIDER_SITE_OTHER): Payer: Self-pay

## 2014-09-19 ENCOUNTER — Encounter: Payer: Self-pay | Admitting: Family Medicine

## 2014-09-19 VITALS — BP 122/74 | HR 100 | Resp 18 | Ht 67.0 in | Wt 172.0 lb

## 2014-09-19 DIAGNOSIS — Z125 Encounter for screening for malignant neoplasm of prostate: Secondary | ICD-10-CM | POA: Diagnosis not present

## 2014-09-19 DIAGNOSIS — R195 Other fecal abnormalities: Secondary | ICD-10-CM

## 2014-09-19 DIAGNOSIS — R259 Unspecified abnormal involuntary movements: Secondary | ICD-10-CM | POA: Diagnosis not present

## 2014-09-19 DIAGNOSIS — I10 Essential (primary) hypertension: Secondary | ICD-10-CM

## 2014-09-19 DIAGNOSIS — M519 Unspecified thoracic, thoracolumbar and lumbosacral intervertebral disc disorder: Secondary | ICD-10-CM | POA: Insufficient documentation

## 2014-09-19 DIAGNOSIS — Z79899 Other long term (current) drug therapy: Secondary | ICD-10-CM

## 2014-09-19 DIAGNOSIS — E785 Hyperlipidemia, unspecified: Secondary | ICD-10-CM

## 2014-09-19 NOTE — Assessment & Plan Note (Signed)
Stable, treated by psychiatry, no decompensation in past 3 years

## 2014-09-19 NOTE — Assessment & Plan Note (Addendum)
Request for shower chair and need for  handicap sticker granted based on known pathology

## 2014-09-19 NOTE — Assessment & Plan Note (Signed)
Resolved with medicaton adjustment by neurology

## 2014-09-19 NOTE — Assessment & Plan Note (Signed)
Controlled, no change in medication DASH diet and commitment to daily physical activity for a minimum of 30 minutes discussed and encouraged, as a part of hypertension management. The importance of attaining a healthy weight is also discussed.  

## 2014-09-19 NOTE — Assessment & Plan Note (Signed)
Controlled, no change in medication Hyperlipidemia:Low fat diet discussed and encouraged.  Updated lab needed at/ before next visit.  

## 2014-09-19 NOTE — Progress Notes (Signed)
   Subjective:    Patient ID: Terry PippinsMark Soto, male    DOB: 03-16-1966, 49 y.o.   MRN: [id number]  HPI The PT is here for follow up and re-evaluation of chronic medical conditions, medication management and review of any available recent lab and radiology data.  Preventive health is updated, specifically  Cancer screening and Immunization.   Questions or concerns regarding consultations or procedures which the PT has had in the interim are  addressed. The PT denies any adverse reactions to current medications since the last visit.  Request for  Shower chair and handicap parking sticker  , Terry Soto has disc disease in lumbar spine and at times has difficulty ambulating safely or balancing safely per  Mother  There are no specific complaints       Review of Systems See HPI Denies recent fever or chills. Denies sinus pressure, nasal congestion, ear pain or sore throat. Denies chest congestion, productive cough or wheezing. Denies chest pains, palpitations and leg swelling Denies abdominal pain, nausea, vomiting,diarrhea or constipation.   Denies dysuria, frequency, hesitancy or incontinence. Denies joint pain, swelling and limitation in mobility. Denies headaches, seizures, numbness, or tingling. Denies depression, anxiety or insomnia. Denies skin break down or rash.        Objective:   Physical Exam BP 122/74 mmHg  Pulse 100  Resp 18  Ht 5\' 7"  (1.702 m)  Wt 172 lb (78.019 kg)  BMI 26.93 kg/m2  SpO2 100% Patient alert and oriented and in no cardiopulmonary distress.  HEENT: No facial asymmetry, EOMI,   oropharynx pink and moist.  Neck supple no JVD, no mass.  Chest: Clear to auscultation bilaterally.  CVS: S1, S2 no murmurs, no S3.Regular rate.  ABD: Soft non tender.   Ext: No edema  MS: Adequate ROM spine, shoulders, hips and knees.  Skin: Intact, no ulcerations or rash noted.  Psych: Good eye contact, blunted  affect. not anxious or depressed appearing.  CNS: CN 2-12  intact, power,  normal throughout.no focal deficits noted.        Assessment & Plan:  Essential hypertension Controlled, no change in medication DASH diet and commitment to daily physical activity for a minimum of 30 minutes discussed and encouraged, as a part of hypertension management. The importance of attaining a healthy weight is also discussed.    Nonorganic psychosis Stable, treated by psychiatry, no decompensation in past 3 years    Hyperlipidemia LDL goal <100 Controlled, no change in medication Hyperlipidemia:Low fat diet discussed and encouraged.  Updated lab needed at/ before next visit.    HYPERCALCEMIA Followed by endo   Abnormal involuntary movement Resolved with medicaton adjustment by neurology   Lumbar disc disease Request for shower chair and need for  handicap sticker granted based on known pathology

## 2014-09-19 NOTE — Patient Instructions (Addendum)
Annual wellness July 14 or after, call if you need me before  Fasting lipid, cmp, CBc and PSa 1 week before follow up.  Keep well and all the best for 2016!  Please commit to daily exercise and eating mainly vegetable and fruiyt  Script and handicap sticker provided

## 2014-09-19 NOTE — Assessment & Plan Note (Signed)
Followed by endo.  

## 2014-10-05 DIAGNOSIS — F919 Conduct disorder, unspecified: Secondary | ICD-10-CM | POA: Diagnosis not present

## 2014-12-31 ENCOUNTER — Other Ambulatory Visit: Payer: Self-pay | Admitting: Family Medicine

## 2015-01-03 DIAGNOSIS — I10 Essential (primary) hypertension: Secondary | ICD-10-CM | POA: Diagnosis not present

## 2015-01-03 DIAGNOSIS — E061 Subacute thyroiditis: Secondary | ICD-10-CM | POA: Diagnosis not present

## 2015-02-27 DIAGNOSIS — F919 Conduct disorder, unspecified: Secondary | ICD-10-CM | POA: Diagnosis not present

## 2015-03-16 DIAGNOSIS — Z79899 Other long term (current) drug therapy: Secondary | ICD-10-CM | POA: Diagnosis not present

## 2015-03-16 DIAGNOSIS — E785 Hyperlipidemia, unspecified: Secondary | ICD-10-CM | POA: Diagnosis not present

## 2015-03-16 DIAGNOSIS — Z125 Encounter for screening for malignant neoplasm of prostate: Secondary | ICD-10-CM | POA: Diagnosis not present

## 2015-03-17 LAB — CBC
HCT: 44.8 % (ref 39.0–52.0)
Hemoglobin: 15.3 g/dL (ref 13.0–17.0)
MCH: 30 pg (ref 26.0–34.0)
MCHC: 34.2 g/dL (ref 30.0–36.0)
MCV: 87.8 fL (ref 78.0–100.0)
MPV: 9.6 fL (ref 8.6–12.4)
Platelets: 293 10*3/uL (ref 150–400)
RBC: 5.1 MIL/uL (ref 4.22–5.81)
RDW: 14.4 % (ref 11.5–15.5)
WBC: 5.9 10*3/uL (ref 4.0–10.5)

## 2015-03-17 LAB — COMPREHENSIVE METABOLIC PANEL
ALBUMIN: 4.5 g/dL (ref 3.5–5.2)
ALT: 34 U/L (ref 0–53)
AST: 19 U/L (ref 0–37)
Alkaline Phosphatase: 73 U/L (ref 39–117)
BILIRUBIN TOTAL: 0.5 mg/dL (ref 0.2–1.2)
BUN: 7 mg/dL (ref 6–23)
CO2: 30 mEq/L (ref 19–32)
CREATININE: 0.99 mg/dL (ref 0.50–1.35)
Calcium: 10.7 mg/dL — ABNORMAL HIGH (ref 8.4–10.5)
Chloride: 103 mEq/L (ref 96–112)
Glucose, Bld: 90 mg/dL (ref 70–99)
POTASSIUM: 4.3 meq/L (ref 3.5–5.3)
Sodium: 140 mEq/L (ref 135–145)
TOTAL PROTEIN: 7.3 g/dL (ref 6.0–8.3)

## 2015-03-17 LAB — LIPID PANEL
CHOLESTEROL: 172 mg/dL (ref 0–200)
HDL: 49 mg/dL (ref >=40)
LDL Cholesterol: 107 mg/dL — ABNORMAL HIGH (ref 0–99)
TRIGLYCERIDES: 82 mg/dL (ref <150)
Total CHOL/HDL Ratio: 3.5 Ratio
VLDL: 16 mg/dL (ref 0–40)

## 2015-03-17 LAB — PSA, MEDICARE: PSA: 0.68 ng/mL (ref <=4.00)

## 2015-03-21 ENCOUNTER — Ambulatory Visit (INDEPENDENT_AMBULATORY_CARE_PROVIDER_SITE_OTHER): Payer: Medicare Other | Admitting: Family Medicine

## 2015-03-21 ENCOUNTER — Encounter: Payer: Self-pay | Admitting: Family Medicine

## 2015-03-21 VITALS — BP 122/72 | HR 90 | Resp 18 | Ht 67.0 in | Wt 173.0 lb

## 2015-03-21 DIAGNOSIS — Z0000 Encounter for general adult medical examination without abnormal findings: Secondary | ICD-10-CM | POA: Diagnosis not present

## 2015-03-21 NOTE — Patient Instructions (Addendum)
Annual physical in Dec 10 or after, call if you need me before  Cut back on fried and fatty foods  It is important that you exercise regularly at least 30 minutes 5 times a week. If you develop chest pain, have severe difficulty breathing, or feel very tired, stop exercising immediately and seek medical attention

## 2015-03-21 NOTE — Progress Notes (Signed)
Subjective:    Patient ID: Terry Soto, male    DOB: 1966/05/08, 49 y.o.   MRN: [id number]  HPI Preventive Screening-Counseling & Management   Patient present here today for a Medicare annual wellness visit.   Current Problems (verified)   Medications Prior to Visit Allergies (verified)   PAST HISTORY  Family History  Social History (updated)   Risk Factors  Current exercise habits:  Patient is very active and helps as able around them home  Dietary issues discussed:  Heart healthy low fat diet   Cardiac risk factors: htn  Depression Screen  (Note: if answer to either of the following is "Yes", a more complete depression screening is indicated)   Over the past two weeks, have you felt down, depressed or hopeless? No  Over the past two weeks, have you felt little interest or pleasure in doing things? No  Have you lost interest or pleasure in daily life? No  Do you often feel hopeless? No  Do you cry easily over simple problems? No   Activities of Daily Living  In your present state of health, do you have any difficulty performing the following activities?  Driving?:  Yes, transported by family Managing money?: Yes unable to handle own finances due to learning capacity Feeding yourself?:No Getting from bed to chair?:No Climbing a flight of stairs?:No Preparing food and eating?:No Bathing or showering?:No Getting dressed?:No Getting to the toilet?:No Using the toilet?:No Moving around from place to place?: No  Fall Risk Assessment In the past year have you fallen or had a near fall?:No Are you currently taking any medications that make you dizzy?:No   Hearing Difficulties: No Do you often ask people to speak up or repeat themselves?:No Do you experience ringing or noises in your ears?:No Do you have difficulty understanding soft or whispered voices?:No  Cognitive Testing  Alert? Yes Normal Appearance?Yes  Oriented to person? Yes Place? Yes  Time? Yes    Displays appropriate judgment? Yes, but has difficulty at times especially with big decisions  Can read the correct time from a watch face? yes Are you having problems remembering things?No  Advanced Directives have been discussed with the patient?Yes and brochure given , full code   List the Names of Other Physician/Practitioners you currently use: careteams updated    Indicate any recent Medical Services you may have received from other than [facility] providers in the past year (date may be approximate).   Assessment:    Annual Wellness Exam   Plan:    Medicare Attestation  I have personally reviewed:  The patient's medical and social history  Their use of alcohol, tobacco or illicit drugs  Their current medications and supplements  The patient's functional ability including ADLs,fall risks, home safety risks, cognitive, and hearing and visual impairment  Diet and physical activities  Evidence for depression or mood disorders  The patient's weight, height, BMI, and visual acuity have been recorded in the chart. I have made referrals, counseling, and provided education to the patient based on review of the above and I have provided the patient with a written personalized care plan for preventive services.      Review of Systems     Objective:   Physical Exam BP 122/72 mmHg  Pulse 90  Resp 18  Ht  (1.702 m)  Wt 173 lb 0.6 oz (78.49 kg)  BMI 27.10 kg/m2  SpO2 96%       Assessment & Plan:  Medicare annual wellness visit,  subsequent Annual exam as documented. Counseling done  re healthy lifestyle involving commitment to 150 minutes exercise per week, heart healthy diet, and attaining healthy weight.The importance of adequate sleep also discussed. Regular seat belt use and home safety, is also discussed. Changes in health habits are decided on by the patient with goals and time frames  set for achieving them. Immunization and cancer screening needs are specifically  addressed at this visit.

## 2015-03-21 NOTE — Assessment & Plan Note (Signed)

## 2015-03-29 ENCOUNTER — Telehealth: Payer: Self-pay | Admitting: Family Medicine

## 2015-03-29 MED ORDER — BENZONATATE 100 MG PO CAPS: 100.0000 mg | ORAL_CAPSULE | Freq: Two times a day (BID) | ORAL | Status: DC | PRN

## 2015-03-29 MED ORDER — PREDNISONE 5 MG PO TABS: 5.0000 mg | ORAL_TABLET | Freq: Two times a day (BID) | ORAL | Status: AC

## 2015-03-29 NOTE — Telephone Encounter (Signed)
Cough and chest congestion x 2 day, Mom has been like this for 2 weeks, no fever or chills

## 2015-04-06 ENCOUNTER — Other Ambulatory Visit: Payer: Self-pay | Admitting: Family Medicine

## 2015-04-12 ENCOUNTER — Encounter: Payer: Self-pay | Admitting: Family Medicine

## 2015-04-12 ENCOUNTER — Ambulatory Visit (INDEPENDENT_AMBULATORY_CARE_PROVIDER_SITE_OTHER): Payer: Medicare Other | Admitting: Family Medicine

## 2015-04-12 VITALS — BP 120/76 | HR 98 | Temp 99.3°F | Resp 18 | Ht 67.0 in | Wt 171.0 lb

## 2015-04-12 DIAGNOSIS — J209 Acute bronchitis, unspecified: Secondary | ICD-10-CM

## 2015-04-12 DIAGNOSIS — I10 Essential (primary) hypertension: Secondary | ICD-10-CM

## 2015-04-12 MED ORDER — BENZONATATE 100 MG PO CAPS: 100.0000 mg | ORAL_CAPSULE | Freq: Three times a day (TID) | ORAL | Status: DC | PRN

## 2015-04-12 MED ORDER — PREDNISONE 5 MG PO TABS: 5.0000 mg | ORAL_TABLET | Freq: Two times a day (BID) | ORAL | Status: AC

## 2015-04-12 MED ORDER — SULFAMETHOXAZOLE-TRIMETHOPRIM 800-160 MG PO TABS: 1.0000 | ORAL_TABLET | Freq: Two times a day (BID) | ORAL | Status: DC

## 2015-04-12 MED ORDER — CEFTRIAXONE SODIUM 1 G IJ SOLR
500.0000 mg | Freq: Once | INTRAMUSCULAR | Status: AC
  Administered 2015-04-12: 500 mg via INTRAMUSCULAR

## 2015-04-12 NOTE — Assessment & Plan Note (Signed)
Rocephin  In office, antibiotic and decongestants prescribed

## 2015-04-12 NOTE — Assessment & Plan Note (Signed)
Controlled, no change in medication  

## 2015-04-12 NOTE — Patient Instructions (Signed)
Annual wellness as before, please call if you need me sooner  You are treated today for CUTE BRONCHITIS, 3 MEDICATIONS ARE SENT SEPTRA , ANTIBIOTIC, PREDNISONE , AND TESSALON PERLES  ROCEPHIN IN OFFICE TODAY  Hope you recover soon and fully

## 2015-04-12 NOTE — Progress Notes (Signed)
   Subjective:    Patient ID: Terry Soto, male    DOB: 04-01-1966, 49 y.o.   MRN: [id number]  HPI 1 week h/o chest congestion, cream sputum and cough, low grade temp in office, Mother reports no change in activity or appetite, but disturbing cough at night despite OTC meds and decongestant perles. She herself was recently ill   Review of Systems    See HPI  Denies sinus pressure, nasal congestion, ear pain or sore throat.  Denies abdominal pain, nausea, vomiting,diarrhea or constipation.   Denies skin break down or rash.     Objective:   Physical Exam   BP 120/76 mmHg  Pulse 98  Temp(Src) 99.3 F (37.4 C)  Resp 18  Ht  (1.702 m)  Wt 171 lb (77.565 kg)  BMI 26.78 kg/m2  SpO2 100%   Patient alert and oriented and in no cardiopulmonary distress.  HEENT: No facial asymmetry, EOMI,   oropharynx pink and moist.  Neck supple no JVD, no mass.  Chest: Adequate air entry, scattered crackles no wheezes  CVS: S1, S2 no murmurs, no S3.Regular rate.  ABD: Soft non tender.   Ext: No edema  MS: Adequate ROM spine, shoulders, hips and knees.         Assessment & Plan:  Acute bronchitis Rocephin  In office, antibiotic and decongestants prescribed  Essential hypertension Controlled, no change in medication

## 2015-04-17 ENCOUNTER — Other Ambulatory Visit: Payer: Self-pay | Admitting: Family Medicine

## 2015-04-20 ENCOUNTER — Ambulatory Visit (HOSPITAL_COMMUNITY)
Admission: RE | Admit: 2015-04-20 | Discharge: 2015-04-20 | Disposition: A | Discharge status: Home / Self Care | Payer: Medicare Other | Source: Ambulatory Visit | Attending: Family Medicine | Admitting: Family Medicine

## 2015-04-20 ENCOUNTER — Ambulatory Visit (INDEPENDENT_AMBULATORY_CARE_PROVIDER_SITE_OTHER): Payer: Medicare Other

## 2015-04-20 ENCOUNTER — Telehealth: Payer: Self-pay | Admitting: *Deleted

## 2015-04-20 DIAGNOSIS — J209 Acute bronchitis, unspecified: Secondary | ICD-10-CM | POA: Diagnosis not present

## 2015-04-20 DIAGNOSIS — R05 Cough: Secondary | ICD-10-CM | POA: Insufficient documentation

## 2015-04-20 DIAGNOSIS — R058 Other specified cough: Secondary | ICD-10-CM

## 2015-04-20 MED ORDER — ALBUTEROL SULFATE HFA 108 (90 BASE) MCG/ACT IN AERS
2.0000 | INHALATION_SPRAY | Freq: Four times a day (QID) | RESPIRATORY_TRACT | Status: DC | PRN
Start: 2015-04-20 — End: 2015-05-11

## 2015-04-20 MED ORDER — PROMETHAZINE-DM 6.25-15 MG/5ML PO SYRP: ORAL_SOLUTION | ORAL | Status: DC

## 2015-04-20 MED ORDER — METHYLPREDNISOLONE ACETATE 80 MG/ML IJ SUSP
80.0000 mg | Freq: Once | INTRAMUSCULAR | Status: AC
  Administered 2015-04-20: 80 mg via INTRAMUSCULAR

## 2015-04-20 MED ORDER — PREDNISONE 5 MG (21) PO TBPK: 5.0000 mg | ORAL_TABLET | ORAL | Status: DC

## 2015-04-20 NOTE — Progress Notes (Signed)
Patient in for injection of Depo Medrol 80 mg for cough w/ production.  Injection given as ordered. Patient will take prescribed meds.  CXR and sputum culture orders as well

## 2015-04-20 NOTE — Telephone Encounter (Signed)
Pt's mother called wanting patient to be seen today for bronchitis and his chest hurts a little bit from coughing, I offered pt an appt for Monday at 10:00, pt's mother wants pt to be seen today, I told her I spoke with Toni Amend and Toni Amend is sending Dr. Lodema Hong a message to order a chest x-ray.

## 2015-04-20 NOTE — Telephone Encounter (Signed)
Patient in for nurse visit.   CXR completed.   Specimen cup given for sputum collection.  Will collect meds from pharmacy.

## 2015-04-20 NOTE — Telephone Encounter (Signed)
Mother will also come by office and collect cup for sputum culture.

## 2015-04-20 NOTE — Telephone Encounter (Signed)
Orders for chest xray entered.

## 2015-04-20 NOTE — Telephone Encounter (Signed)
pls yes order CXR and sputum c/s  I am sending in prednisone dose pack and and cough suppressant syrup, albuterol inhaler  Pls gfive depo medrol 80 mg iM in office

## 2015-04-22 LAB — RESPIRATORY CULTURE OR RESPIRATORY AND SPUTUM CULTURE: Organism ID, Bacteria: NORMAL

## 2015-05-11 ENCOUNTER — Other Ambulatory Visit: Payer: Self-pay | Admitting: Family Medicine

## 2015-05-23 ENCOUNTER — Ambulatory Visit (INDEPENDENT_AMBULATORY_CARE_PROVIDER_SITE_OTHER): Payer: Medicare Other

## 2015-05-23 DIAGNOSIS — Z23 Encounter for immunization: Secondary | ICD-10-CM | POA: Diagnosis not present

## 2015-05-31 ENCOUNTER — Other Ambulatory Visit: Payer: Self-pay

## 2015-05-31 MED ORDER — ALBUTEROL SULFATE HFA 108 (90 BASE) MCG/ACT IN AERS: INHALATION_SPRAY | RESPIRATORY_TRACT | Status: DC

## 2015-06-14 ENCOUNTER — Other Ambulatory Visit: Payer: Self-pay | Admitting: Family Medicine

## 2015-07-05 DIAGNOSIS — I10 Essential (primary) hypertension: Secondary | ICD-10-CM | POA: Diagnosis not present

## 2015-07-05 DIAGNOSIS — E061 Subacute thyroiditis: Secondary | ICD-10-CM | POA: Diagnosis not present

## 2015-07-05 DIAGNOSIS — E559 Vitamin D deficiency, unspecified: Secondary | ICD-10-CM | POA: Diagnosis not present

## 2015-07-24 DIAGNOSIS — F919 Conduct disorder, unspecified: Secondary | ICD-10-CM | POA: Diagnosis not present

## 2015-08-28 ENCOUNTER — Other Ambulatory Visit: Payer: Self-pay | Admitting: Family Medicine

## 2015-08-29 ENCOUNTER — Ambulatory Visit (INDEPENDENT_AMBULATORY_CARE_PROVIDER_SITE_OTHER): Payer: Medicare Other | Admitting: Family Medicine

## 2015-08-29 ENCOUNTER — Encounter: Payer: Self-pay | Admitting: Family Medicine

## 2015-08-29 VITALS — BP 124/72 | HR 90 | Resp 18 | Ht 67.0 in | Wt 181.0 lb

## 2015-08-29 DIAGNOSIS — Z1159 Encounter for screening for other viral diseases: Secondary | ICD-10-CM

## 2015-08-29 DIAGNOSIS — Z0000 Encounter for general adult medical examination without abnormal findings: Secondary | ICD-10-CM | POA: Diagnosis not present

## 2015-08-29 DIAGNOSIS — E785 Hyperlipidemia, unspecified: Secondary | ICD-10-CM

## 2015-08-29 DIAGNOSIS — I10 Essential (primary) hypertension: Secondary | ICD-10-CM

## 2015-08-29 NOTE — Progress Notes (Signed)
Subjective:    Patient ID: Terry Soto, male    DOB: 02-27-1966, 49 y.o.   MRN: [id number]  HPI Preventive Screening-Counseling & Management   Patient present here today for a Medicare annual wellness visit.   Current Problems (verified)   Medications Prior to Visit Allergies (verified)   PAST HISTORY  Family History (updated)  Social History Mentally Disabled male;  Lives with mother    Risk Factors  Current exercise habits:    Dietary issues discussed:   Cardiac risk factors:   Depression Screen  (Note: if answer to either of the following is "Yes", a more complete depression screening is indicated)   Over the past two weeks, have you felt down, depressed or hopeless? No  Over the past two weeks, have you felt little interest or pleasure in doing things? No  Have you lost interest or pleasure in daily life? No  Do you often feel hopeless? No  Do you cry easily over simple problems? No   Activities of Daily Living  In your present state of health, do you have any difficulty performing the following activities?  Driving?: Yes, no license  Managing money?: Yes, finances handled by mother  Feeding yourself?:No Getting from bed to chair?:No Climbing a flight of stairs?:No Preparing food and eating?:No Bathing or showering?:No Getting dressed?:No Getting to the toilet?:No Using the toilet?:No Moving around from place to place?: No  Fall Risk Assessment In the past year have you fallen or had a near fall?:No Are you currently taking any medications that make you dizzy?:No   Hearing Difficulties: No Do you often ask people to speak up or repeat themselves?:No Do you experience ringing or noises in your ears?:No Do you have difficulty understanding soft or whispered voices?:No  Cognitive Testing  Alert? Yes Normal Appearance?Yes  Oriented to person? Yes Place? Yes  Time? Yes  Displays appropriate judgment? no Can read the correct time from a watch face?  yes Are you having problems remembering things?yes  Advanced Directives have been discussed with the patient?Yes and brochure given to mother, full code   List the Names of Other Physician/Practitioners you currently use: careteams updated    Indicate any recent Medical Services you may have received from other than [facility] providers in the past year (date may be approximate).   Assessment:    Annual Wellness Exam   Plan:     Medicare Attestation  I have personally reviewed:  The patient's medical and social history  Their use of alcohol, tobacco or illicit drugs  Their current medications and supplements  The patient's functional ability including ADLs,fall risks, home safety risks, cognitive, and hearing and visual impairment  Diet and physical activities  Evidence for depression or mood disorders  The patient's weight, height, BMI, and visual acuity have been recorded in the chart. I have made referrals, counseling, and provided education to the patient based on review of the above and I have provided the patient with a written personalized care plan for preventive services.      Review of Systems     Objective:   Physical Exam  BP 124/72 mmHg  Pulse 90  Resp 18  Ht  (1.702 m)  Wt 181 lb (82.101 kg)  BMI 28.34 kg/m2  SpO2 98%       Assessment & Plan:  Medicare annual wellness visit, subsequent Annual exam as documented. Counseling done  re healthy lifestyle involving commitment to 150 minutes exercise per week, heart healthy diet, and  attaining healthy weight.The importance of adequate sleep also discussed. Regular seat belt use and home safety, is also discussed. Changes in health habits are decided on by the patient with goals and time frames  set for achieving them. Immunization and cancer screening needs are specifically addressed at this visit.

## 2015-08-29 NOTE — Assessment & Plan Note (Signed)

## 2015-08-29 NOTE — Patient Instructions (Signed)
Annual physical in 5 month, call if you need me sooner  Fasting labs end January  Please work on good  health habits so that your health will improve. 1. Commitment to daily physical activity for 30 to 60  minutes, if you are able to do this.  2. Commitment to wise food choices. Aim for half of your  food intake to be vegetable and fruit, one quarter starchy foods, and one quarter protein. Try to eat on a regular schedule  3 meals per day, snacking between meals should be limited to vegetables or fruits or small portions of nuts. 64 ounces of water per day is generally recommended, unless you have specific health conditions, like heart failure or kidney failure where you will need to limit fluid intake.  3. Commitment to sufficient and a  good quality of physical and mental rest daily, generally between 6 to 8 hours per day.  WITH PERSISTANCE AND PERSEVERANCE, THE IMPOSSIBLE , BECOMES THE NORM!  [facility], we consider it a privelige to serve you.  All the best for 2017!

## 2015-09-20 DIAGNOSIS — K219 Gastro-esophageal reflux disease without esophagitis: Secondary | ICD-10-CM | POA: Diagnosis not present

## 2015-09-20 DIAGNOSIS — F79 Unspecified intellectual disabilities: Secondary | ICD-10-CM | POA: Diagnosis not present

## 2015-09-20 DIAGNOSIS — F78 Other intellectual disabilities: Secondary | ICD-10-CM | POA: Diagnosis not present

## 2015-09-20 DIAGNOSIS — I10 Essential (primary) hypertension: Secondary | ICD-10-CM | POA: Diagnosis not present

## 2015-09-20 DIAGNOSIS — R251 Tremor, unspecified: Secondary | ICD-10-CM | POA: Diagnosis not present

## 2015-09-28 ENCOUNTER — Other Ambulatory Visit: Payer: Self-pay

## 2015-10-28 ENCOUNTER — Other Ambulatory Visit (HOSPITAL_COMMUNITY): Payer: Self-pay | Admitting: Psychiatry

## 2015-11-05 ENCOUNTER — Other Ambulatory Visit: Payer: Self-pay | Admitting: Family Medicine

## 2015-12-21 DIAGNOSIS — F919 Conduct disorder, unspecified: Secondary | ICD-10-CM | POA: Diagnosis not present

## 2016-01-01 ENCOUNTER — Other Ambulatory Visit: Payer: Self-pay | Admitting: Family Medicine

## 2016-01-24 DIAGNOSIS — Z1159 Encounter for screening for other viral diseases: Secondary | ICD-10-CM | POA: Diagnosis not present

## 2016-01-24 DIAGNOSIS — I10 Essential (primary) hypertension: Secondary | ICD-10-CM | POA: Diagnosis not present

## 2016-01-24 DIAGNOSIS — E785 Hyperlipidemia, unspecified: Secondary | ICD-10-CM | POA: Diagnosis not present

## 2016-01-25 LAB — COMPREHENSIVE METABOLIC PANEL
ALT: 23 U/L (ref 9–46)
AST: 22 U/L (ref 10–40)
Albumin: 4.4 g/dL (ref 3.6–5.1)
Alkaline Phosphatase: 74 U/L (ref 40–115)
BUN: 8 mg/dL (ref 7–25)
CHLORIDE: 102 mmol/L (ref 98–110)
CO2: 25 mmol/L (ref 20–31)
CREATININE: 0.97 mg/dL (ref 0.60–1.35)
Calcium: 10.4 mg/dL — ABNORMAL HIGH (ref 8.6–10.3)
GLUCOSE: 98 mg/dL (ref 65–99)
POTASSIUM: 4.1 mmol/L (ref 3.5–5.3)
Sodium: 139 mmol/L (ref 135–146)
Total Bilirubin: 0.4 mg/dL (ref 0.2–1.2)
Total Protein: 7.2 g/dL (ref 6.1–8.1)

## 2016-01-25 LAB — LIPID PANEL
CHOL/HDL RATIO: 3 ratio (ref <=5.0)
Cholesterol: 142 mg/dL (ref 125–200)
HDL: 48 mg/dL (ref >=40)
LDL CALC: 78 mg/dL (ref <130)
Triglycerides: 80 mg/dL (ref <150)
VLDL: 16 mg/dL (ref <30)

## 2016-01-25 LAB — HIV ANTIBODY (ROUTINE TESTING W REFLEX): HIV: NONREACTIVE

## 2016-01-31 ENCOUNTER — Ambulatory Visit (INDEPENDENT_AMBULATORY_CARE_PROVIDER_SITE_OTHER): Payer: Medicare Other | Admitting: Family Medicine

## 2016-01-31 ENCOUNTER — Encounter: Payer: Self-pay | Admitting: Family Medicine

## 2016-01-31 VITALS — BP 120/80 | HR 97 | Resp 16 | Ht 67.0 in | Wt 181.1 lb

## 2016-01-31 DIAGNOSIS — Z1211 Encounter for screening for malignant neoplasm of colon: Secondary | ICD-10-CM

## 2016-01-31 DIAGNOSIS — Z0000 Encounter for general adult medical examination without abnormal findings: Secondary | ICD-10-CM | POA: Diagnosis not present

## 2016-01-31 DIAGNOSIS — I10 Essential (primary) hypertension: Secondary | ICD-10-CM

## 2016-01-31 DIAGNOSIS — E559 Vitamin D deficiency, unspecified: Secondary | ICD-10-CM | POA: Diagnosis not present

## 2016-01-31 DIAGNOSIS — Z125 Encounter for screening for malignant neoplasm of prostate: Secondary | ICD-10-CM

## 2016-01-31 LAB — POC HEMOCCULT BLD/STL (OFFICE/1-CARD/DIAGNOSTIC): FECAL OCCULT BLD: NEGATIVE

## 2016-01-31 NOTE — Patient Instructions (Addendum)
F/u in 4 month, call if you need me sooner  Non fast chem 7 and EGFr, PSA, CBC, vit D in 4 month  Thank you  for choosing [facility]. We consider it a privelige to serve you.  Delivering excellent health care in a caring and  compassionate way is our goal.  Partnering with you,  so that together we can achieve this goal is our strategy.    HAPPY BIRTHDAY, and MANY, MANY MORE!!!

## 2016-01-31 NOTE — Progress Notes (Signed)
   Subjective:    Patient ID: Terry PippinsMark Soto, male    DOB: February 05, 1966, 50 y.o.   MRN: [id number]  HPI  Patient is in for annual physical exam. No other health concerns are expressed or addressed at the visit. Recent labs, if available are reviewed. Immunization is reviewed , and  updated if needed.   Review of Systems See HPI     Objective:   Physical Exam BP 120/80 mmHg  Pulse 97  Resp 16  Ht 5\' 7"  (1.702 m)  Wt 181 lb 1.9 oz (82.155 kg)  BMI 28.36 kg/m2  SpO2 99%   Pleasant well nourished male, alert , in no cardio-pulmonary distress. Afebrile. HEENT No facial trauma or asymetry. Sinuses non tender. EOMI, PERTL, fundoscopic exam is negative for hemorhages or exudates. External ears normal, tympanic membranes clear. Oropharynx moist, no exudate, good dentition. Neck: supple, no adenopathy,JVD or thyromegaly.No bruits.  Chest: Clear to ascultation bilaterally.No crackles or wheezes. Non tender to palpation  Breast: No asymetry,no masses. No nipple discharge or inversion. No axillary or supraclavicular adenopathy  Cardiovascular system; Heart sounds normal,  S1 and  S2 ,no S3.  No murmur, or thrill. Apical beat not displaced Peripheral pulses normal.  Abdomen: Soft, non tender, no organomegaly or masses. No bruits. Bowel sounds normal. No guarding, tenderness or rebound.  Rectal:  Normal sphincter tone. No hemorrhoids or  masses. guaiac negative stool. Prostate smooth and firm  GU: Not examined  Musculoskeletal exam: Decreased , but adequate  ROM of spine, normal in  hips , shoulders and knees. No deformity ,swelling or crepitus noted. No muscle wasting or atrophy.   Neurologic: Cranial nerves 2 to 12 intact. Power, tone ,sensation and reflexes normal throughout. No disturbance in gait. No tremor.  Skin: Intact, no ulceration, erythema , scaling or rash noted. Pigmentation normal throughout  Psych; Normal mood and blunted  affect.            Assessment & Plan:  Annual physical exam Annual exam as documented. Counseling done  re healthy lifestyle involving commitment to 150 minutes exercise per week, heart healthy diet, and attaining healthy weight.The importance of adequate sleep also discussed. Regular seat belt use and home safety, is also discussed. Changes in health habits are decided on by the patient with goals and time frames  set for achieving them. Immunization and cancer screening needs are specifically addressed at this visit.

## 2016-01-31 NOTE — Assessment & Plan Note (Signed)

## 2016-02-01 NOTE — Addendum Note (Signed)
Addended by: Kerri PerchesSIMPSON, MARGARET E on: 02/01/2016 08:25 AM   Modules accepted: Level of Service

## 2016-03-16 ENCOUNTER — Other Ambulatory Visit: Payer: Self-pay | Admitting: Family Medicine

## 2016-04-23 ENCOUNTER — Other Ambulatory Visit: Payer: Self-pay | Admitting: Family Medicine

## 2016-05-13 ENCOUNTER — Ambulatory Visit (INDEPENDENT_AMBULATORY_CARE_PROVIDER_SITE_OTHER): Payer: Medicare Other

## 2016-05-13 ENCOUNTER — Other Ambulatory Visit: Payer: Self-pay | Admitting: Family Medicine

## 2016-05-13 DIAGNOSIS — Z23 Encounter for immunization: Secondary | ICD-10-CM

## 2016-05-16 DIAGNOSIS — E559 Vitamin D deficiency, unspecified: Secondary | ICD-10-CM | POA: Diagnosis not present

## 2016-05-16 DIAGNOSIS — I10 Essential (primary) hypertension: Secondary | ICD-10-CM | POA: Diagnosis not present

## 2016-05-16 DIAGNOSIS — Z0000 Encounter for general adult medical examination without abnormal findings: Secondary | ICD-10-CM | POA: Diagnosis not present

## 2016-05-16 DIAGNOSIS — Z125 Encounter for screening for malignant neoplasm of prostate: Secondary | ICD-10-CM | POA: Diagnosis not present

## 2016-05-16 LAB — CBC
HCT: 44.2 % (ref 38.5–50.0)
Hemoglobin: 15.2 g/dL (ref 13.2–17.1)
MCH: 30 pg (ref 27.0–33.0)
MCHC: 34.4 g/dL (ref 32.0–36.0)
MCV: 87.2 fL (ref 80.0–100.0)
MPV: 10.4 fL (ref 7.5–12.5)
Platelets: 327 K/uL (ref 140–400)
RBC: 5.07 MIL/uL (ref 4.20–5.80)
RDW: 13.6 % (ref 11.0–15.0)
WBC: 6.1 K/uL (ref 3.8–10.8)

## 2016-05-17 ENCOUNTER — Encounter: Payer: Self-pay | Admitting: Family Medicine

## 2016-05-17 ENCOUNTER — Ambulatory Visit (INDEPENDENT_AMBULATORY_CARE_PROVIDER_SITE_OTHER): Payer: Medicare Other | Admitting: Family Medicine

## 2016-05-17 VITALS — BP 114/82 | HR 91 | Resp 16 | Ht 67.0 in | Wt 182.0 lb

## 2016-05-17 DIAGNOSIS — M519 Unspecified thoracic, thoracolumbar and lumbosacral intervertebral disc disorder: Secondary | ICD-10-CM | POA: Diagnosis not present

## 2016-05-17 DIAGNOSIS — E785 Hyperlipidemia, unspecified: Secondary | ICD-10-CM

## 2016-05-17 DIAGNOSIS — I10 Essential (primary) hypertension: Secondary | ICD-10-CM | POA: Diagnosis not present

## 2016-05-17 DIAGNOSIS — F29 Unspecified psychosis not due to a substance or known physiological condition: Secondary | ICD-10-CM

## 2016-05-17 LAB — BASIC METABOLIC PANEL WITH GFR
BUN: 10 mg/dL (ref 7–25)
CO2: 26 mmol/L (ref 20–31)
Calcium: 10.6 mg/dL — ABNORMAL HIGH (ref 8.6–10.3)
Chloride: 101 mmol/L (ref 98–110)
Creat: 1.03 mg/dL (ref 0.60–1.35)
GFR, Est African American: >89 mL/min (ref >=60)
GFR, Est Non African American: 85 mL/min (ref >=60)
Glucose, Bld: 120 mg/dL — ABNORMAL HIGH (ref 65–99)
Potassium: 4.4 mmol/L (ref 3.5–5.3)
Sodium: 137 mmol/L (ref 135–146)

## 2016-05-17 LAB — VITAMIN D 25 HYDROXY (VIT D DEFICIENCY, FRACTURES): VIT D 25 HYDROXY: 38 ng/mL (ref 30–100)

## 2016-05-17 LAB — PSA: PSA: 0.6 ng/mL (ref <=4.0)

## 2016-05-17 NOTE — Assessment & Plan Note (Signed)
Followed by endo, on no medication 

## 2016-05-17 NOTE — Assessment & Plan Note (Signed)
Stable and followed by psychiatry 

## 2016-05-17 NOTE — Patient Instructions (Signed)
F/u in 4.5 month, call if you need me sooner  Excellent labs  Fasting lipid, cmp and rGFR in 4.5 month  Please work on good  health habits so that your health will improve. 1. Commitment to daily physical activity for 30 to 60  minutes, if you are able to do this.  2. Commitment to wise food choices. Aim for half of your  food intake to be vegetable and fruit, one quarter starchy foods, and one quarter protein. Try to eat on a regular schedule  3 meals per day, snacking between meals should be limited to vegetables or fruits or small portions of nuts. 64 ounces of water per day is generally recommended, unless you have specific health conditions, like heart failure or kidney failure where you will need to limit fluid intake.  3. Commitment to sufficient and a  good quality of physical and mental rest daily, generally between 6 to 8 hours per day.  WITH PERSISTANCE AND PERSEVERANCE, THE IMPOSSIBLE , BECOMES THE NORM!  It is important that you exercise regularly at least 30 minutes 5 times a week. If you develop chest pain, have severe difficulty breathing, or feel very tired, stop exercising immediately and seek medical attention    Thank you  for choosing [facility]. We consider it a privelige to serve you.  Delivering excellent health care in a caring and  compassionate way is our goal.  Partnering with you,  so that together we can achieve this goal is our strategy.

## 2016-05-17 NOTE — Progress Notes (Signed)
   Terry PippinsMark Soto     MRN: [id number]      DOB: 06/17/1966   HPI [name] is here for follow up and re-evaluation of chronic medical conditions, medication management and review of any available recent lab and radiology data.  Preventive health is updated, specifically  Cancer screening and Immunization.   Questions or concerns regarding consultations or procedures which the PT has had in the interim are  addressed. The PT denies any adverse reactions to current medications since the last visit.  Mother requests help with bathroom fixtures, states comode and shower need upgrading/ replacement, old and difficult to get in and out of and to use, letter of need written  ROS Denies recent fever or chills. Denies sinus pressure, nasal congestion, ear pain or sore throat. Denies chest congestion, productive cough or wheezing. Denies chest pains, palpitations and leg swelling Denies abdominal pain, nausea, vomiting,diarrhea or constipation.   Denies dysuria, frequency, hesitancy or incontinence. Denies uncontrolled  joint pain, swelling and limitation in mobility. Denies headaches, seizures, numbness, or tingling. Denies depression, anxiety or insomnia. Denies skin break down or rash.   PE  BP 114/82   Pulse 91   Resp 16   Ht 5\' 7"  (1.702 m)   Wt 182 lb (82.6 kg)   SpO2 99%   BMI 28.51 kg/m   Patient alert and oriented and in no cardiopulmonary distress.  HEENT: No facial asymmetry, EOMI,   oropharynx pink and moist.  Neck supple no JVD, no mass.  Chest: Clear to auscultation bilaterally.  CVS: S1, S2 no murmurs, no S3.Regular rate.  ABD: Soft non tender.   Ext: No edema  MS: Adequate though reduced  ROM spine, shoulders, hips and knees.  Skin: Intact, no ulcerations or rash noted.  Psych: Good eye contact, normal affect. Memory intact not anxious or depressed appearing.  CNS: CN 2-12 intact, power,  normal throughout.no focal deficits noted.   Assessment & Plan  Essential  hypertension Controlled, no change in medication DASH diet and commitment to daily physical activity for a minimum of 30 minutes discussed and encouraged, as a part of hypertension management. The importance of attaining a healthy weight is also discussed.  BP/Weight 05/17/2016 01/31/2016 08/29/2015 04/12/2015 03/21/2015 09/19/2014 08/09/2014  Systolic BP 114 120 124 120 122 122 135  Diastolic BP 82 80 72 76 72 74 88  Wt. (Lbs) 182 181.12 181 171 173.04 172 177  BMI 28.51 28.36 28.34 26.78 27.1 26.93 27.72       Hyperlipidemia LDL goal <100 Hyperlipidemia:Low fat diet discussed and encouraged.   Lipid Panel  Lab Results  Component Value Date   CHOL 142 01/24/2016   HDL 48 01/24/2016   LDLCALC 78 01/24/2016   TRIG 80 01/24/2016   CHOLHDL 3.0 01/24/2016   Updated lab needed at/ before next visit. 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

## 2016-05-17 NOTE — Assessment & Plan Note (Signed)
Controlled, no change in medication DASH diet and commitment to daily physical activity for a minimum of 30 minutes discussed and encouraged, as a part of hypertension management. The importance of attaining a healthy weight is also discussed.  BP/Weight 05/17/2016 01/31/2016 08/29/2015 04/12/2015 03/21/2015 09/19/2014 08/09/2014  Systolic BP 114 120 124 120 122 122 135  Diastolic BP 82 80 72 76 72 74 88  Wt. (Lbs) 182 181.12 181 171 173.04 172 177  BMI 28.51 28.36 28.34 26.78 27.1 26.93 27.72

## 2016-05-17 NOTE — Assessment & Plan Note (Signed)
No recent flare of pain nd debility, however assistance with bathroom fixtures requested based on stated need by his mother, Socoal services/ caps will evaluate and manage appropriately

## 2016-05-17 NOTE — Assessment & Plan Note (Signed)
Hyperlipidemia:Low fat diet discussed and encouraged.   Lipid Panel  Lab Results  Component Value Date   CHOL 142 01/24/2016   HDL 48 01/24/2016   LDLCALC 78 01/24/2016   TRIG 80 01/24/2016   CHOLHDL 3.0 01/24/2016   Updated lab needed at/ before next visit. Controlled when last checked

## 2016-05-20 DIAGNOSIS — F919 Conduct disorder, unspecified: Secondary | ICD-10-CM | POA: Diagnosis not present

## 2016-05-31 ENCOUNTER — Ambulatory Visit: Payer: Medicare Other | Admitting: Family Medicine

## 2016-06-24 DIAGNOSIS — H40013 Open angle with borderline findings, low risk, bilateral: Secondary | ICD-10-CM | POA: Diagnosis not present

## 2016-06-24 DIAGNOSIS — H04123 Dry eye syndrome of bilateral lacrimal glands: Secondary | ICD-10-CM | POA: Diagnosis not present

## 2016-07-10 DIAGNOSIS — E061 Subacute thyroiditis: Secondary | ICD-10-CM | POA: Diagnosis not present

## 2016-07-10 DIAGNOSIS — I10 Essential (primary) hypertension: Secondary | ICD-10-CM | POA: Diagnosis not present

## 2016-07-22 ENCOUNTER — Other Ambulatory Visit: Payer: Self-pay | Admitting: Family Medicine

## 2016-08-28 ENCOUNTER — Ambulatory Visit (INDEPENDENT_AMBULATORY_CARE_PROVIDER_SITE_OTHER): Payer: Medicare Other

## 2016-08-28 VITALS — BP 122/82 | HR 87 | Temp 97.6°F | Resp 16 | Ht 67.0 in | Wt 178.1 lb

## 2016-08-28 DIAGNOSIS — Z0000 Encounter for general adult medical examination without abnormal findings: Secondary | ICD-10-CM

## 2016-08-28 DIAGNOSIS — E785 Hyperlipidemia, unspecified: Secondary | ICD-10-CM

## 2016-08-28 NOTE — Patient Instructions (Addendum)
Health maintenance: None   Abnormal screenings: None   Patient concerns: None   Nurse concerns: None   Next PCP appt: In 6 months with Dr. Lodema HongSimpson, you will need to come back in 3 months for lab work.   Health Maintenance, Male A healthy lifestyle and preventative care can promote health and wellness.  Maintain regular health, dental, and eye exams.  Eat a healthy diet. Foods like vegetables, fruits, whole grains, low-fat dairy products, and lean protein foods contain the nutrients you need and are low in calories. Decrease your intake of foods high in solid fats, added sugars, and salt. Get information about a proper diet from your health care provider, if necessary.  Regular physical exercise is one of the most important things you can do for your health. Most adults should get at least 150 minutes of moderate-intensity exercise (any activity that increases your heart rate and causes you to sweat) each week. In addition, most adults need muscle-strengthening exercises on 2 or more days a week.   Maintain a healthy weight. The body mass index (BMI) is a screening tool to identify possible weight problems. It provides an estimate of body fat based on height and weight. Your health care provider can find your BMI and can help you achieve or maintain a healthy weight. For males 20 years and older:  A BMI below 18.5 is considered underweight.  A BMI of 18.5 to 24.9 is normal.  A BMI of 25 to 29.9 is considered overweight.  A BMI of 30 and above is considered obese.  Maintain normal blood lipids and cholesterol by exercising and minimizing your intake of saturated fat. Eat a balanced diet with plenty of fruits and vegetables. Blood tests for lipids and cholesterol should begin at age 49 and be repeated every 5 years. If your lipid or cholesterol levels are high, you are over age 49, or you are at high risk for heart disease, you may need your cholesterol levels checked more  frequently.Ongoing high lipid and cholesterol levels should be treated with medicines if diet and exercise are not working.  If you smoke, find out from your health care provider how to quit. If you do not use tobacco, do not start.  Lung cancer screening is recommended for adults aged 55-80 years who are at high risk for developing lung cancer because of a history of smoking. A yearly low-dose CT scan of the lungs is recommended for people who have at least a 30-pack-year history of smoking and are current smokers or have quit within the past 15 years. A pack year of smoking is smoking an average of 1 pack of cigarettes a day for 1 year (for example, a 30-pack-year history of smoking could mean smoking 1 pack a day for 30 years or 2 packs a day for 15 years). Yearly screening should continue until the smoker has stopped smoking for at least 15 years. Yearly screening should be stopped for people who develop a health problem that would prevent them from having lung cancer treatment.  If you choose to drink alcohol, do not have more than 2 drinks per day. One drink is considered to be 12 oz (360 mL) of beer, 5 oz (150 mL) of wine, or 1.5 oz (45 mL) of liquor.  Avoid the use of street drugs. Do not share needles with anyone. Ask for help if you need support or instructions about stopping the use of drugs.  High blood pressure causes heart disease and increases  the risk of stroke. High blood pressure is more likely to develop in:  People who have blood pressure in the end of the normal range (100-139/85-89 mm Hg).  People who are overweight or obese.  People who are African American.  If you are 81-47 years of age, have your blood pressure checked every 3-5 years. If you are 58 years of age or older, have your blood pressure checked every year. You should have your blood pressure measured twice-once when you are at a hospital or clinic, and once when you are not at a hospital or clinic. Record the  average of the two measurements. To check your blood pressure when you are not at a hospital or clinic, you can use:  An automated blood pressure machine at a pharmacy.  A home blood pressure monitor.  If you are 53-66 years old, ask your health care provider if you should take aspirin to prevent heart disease.  Diabetes screening involves taking a blood sample to check your fasting blood sugar level. This should be done once every 3 years after age 52 if you are at a normal weight and without risk factors for diabetes. Testing should be considered at a younger age or be carried out more frequently if you are overweight and have at least 1 risk factor for diabetes.  Colorectal cancer can be detected and often prevented. Most routine colorectal cancer screening begins at the age of 44 and continues through age 59. However, your health care provider may recommend screening at an earlier age if you have risk factors for colon cancer. On a yearly basis, your health care provider may provide home test kits to check for hidden blood in the stool. A small camera at the end of a tube may be used to directly examine the colon (sigmoidoscopy or colonoscopy) to detect the earliest forms of colorectal cancer. Talk to your health care provider about this at age 83 when routine screening begins. A direct exam of the colon should be repeated every 5-10 years through age 64, unless early forms of precancerous polyps or small growths are found.  People who are at an increased risk for hepatitis B should be screened for this virus. You are considered at high risk for hepatitis B if:  You were born in a country where hepatitis B occurs often. Talk with your health care provider about which countries are considered high risk.  Your parents were born in a high-risk country and you have not received a shot to protect against hepatitis B (hepatitis B vaccine).  You have HIV or AIDS.  You use needles to inject street  drugs.  You live with, or have sex with, someone who has hepatitis B.  You are a man who has sex with other men (MSM).  You get hemodialysis treatment.  You take certain medicines for conditions like cancer, organ transplantation, and autoimmune conditions.  Hepatitis C blood testing is recommended for all people born from [birth year] through 1965 and any individual with known risk factors for hepatitis C.  Healthy men should no longer receive prostate-specific antigen (PSA) blood tests as part of routine cancer screening. Talk to your health care provider about prostate cancer screening.  Testicular cancer screening is not recommended for adolescents or adult males who have no symptoms. Screening includes self-exam, a health care provider exam, and other screening tests. Consult with your health care provider about any symptoms you have or any concerns you have about testicular cancer.  Practice  safe sex. Use condoms and avoid high-risk sexual practices to reduce the spread of sexually transmitted infections (STIs).  You should be screened for STIs, including gonorrhea and chlamydia if:  You are sexually active and are younger than 24 years.  You are older than 24 years, and your health care provider tells you that you are at risk for this type of infection.  Your sexual activity has changed since you were last screened, and you are at an increased risk for chlamydia or gonorrhea. Ask your health care provider if you are at risk.  If you are at risk of being infected with HIV, it is recommended that you take a prescription medicine daily to prevent HIV infection. This is called pre-exposure prophylaxis (PrEP). You are considered at risk if:  You are a man who has sex with other men (MSM).  You are a heterosexual man who is sexually active with multiple partners.  You take drugs by injection.  You are sexually active with a partner who has HIV.  Talk with your health care provider about  whether you are at high risk of being infected with HIV. If you choose to begin PrEP, you should first be tested for HIV. You should then be tested every 3 months for as long as you are taking PrEP.  Use sunscreen. Apply sunscreen liberally and repeatedly throughout the day. You should seek shade when your shadow is shorter than you. Protect yourself by wearing long sleeves, pants, a wide-brimmed hat, and sunglasses year round whenever you are outdoors.  Tell your health care provider of new moles or changes in moles, especially if there is a change in shape or color. Also, tell your health care provider if a mole is larger than the size of a pencil eraser.  A one-time screening for abdominal aortic aneurysm (AAA) and surgical repair of large AAAs by ultrasound is recommended for men aged 34-75 years who are current or former smokers.  Stay current with your vaccines (immunizations). This information is not intended to replace advice given to you by your health care provider. Make sure you discuss any questions you have with your health care provider. Document Released: 02/15/2008 Document Revised: 09/09/2014 Document Reviewed: 05/23/2015 Elsevier Interactive Patient Education  2017 [employer].
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
## 2016-09-23 DIAGNOSIS — R251 Tremor, unspecified: Secondary | ICD-10-CM | POA: Diagnosis not present

## 2016-09-23 DIAGNOSIS — F78 Other intellectual disabilities: Secondary | ICD-10-CM | POA: Diagnosis not present

## 2016-09-23 DIAGNOSIS — I10 Essential (primary) hypertension: Secondary | ICD-10-CM | POA: Diagnosis not present

## 2016-09-23 DIAGNOSIS — F79 Unspecified intellectual disabilities: Secondary | ICD-10-CM | POA: Diagnosis not present

## 2016-09-23 DIAGNOSIS — K219 Gastro-esophageal reflux disease without esophagitis: Secondary | ICD-10-CM | POA: Diagnosis not present

## 2016-10-01 ENCOUNTER — Ambulatory Visit: Payer: Medicare Other | Admitting: Family Medicine

## 2016-10-17 ENCOUNTER — Other Ambulatory Visit: Payer: Self-pay | Admitting: Family Medicine

## 2016-10-21 DIAGNOSIS — F919 Conduct disorder, unspecified: Secondary | ICD-10-CM | POA: Diagnosis not present

## 2016-10-27 ENCOUNTER — Other Ambulatory Visit: Payer: Self-pay | Admitting: Family Medicine

## 2017-01-21 DIAGNOSIS — E785 Hyperlipidemia, unspecified: Secondary | ICD-10-CM | POA: Diagnosis not present

## 2017-01-22 LAB — LIPID PANEL
CHOLESTEROL: 145 mg/dL (ref <200)
HDL: 41 mg/dL (ref >40)
LDL Cholesterol: 86 mg/dL (ref <100)
TRIGLYCERIDES: 92 mg/dL (ref <150)
Total CHOL/HDL Ratio: 3.5 Ratio (ref <5.0)
VLDL: 18 mg/dL (ref <30)

## 2017-01-22 LAB — COMPREHENSIVE METABOLIC PANEL
ALBUMIN: 4.6 g/dL (ref 3.6–5.1)
ALK PHOS: 78 U/L (ref 40–115)
ALT: 23 U/L (ref 9–46)
AST: 19 U/L (ref 10–40)
BILIRUBIN TOTAL: 0.4 mg/dL (ref 0.2–1.2)
BUN: 8 mg/dL (ref 7–25)
CALCIUM: 10.8 mg/dL — AB (ref 8.6–10.3)
CO2: 27 mmol/L (ref 20–31)
Chloride: 102 mmol/L (ref 98–110)
Creat: 1.1 mg/dL (ref 0.60–1.35)
Glucose, Bld: 94 mg/dL (ref 65–99)
POTASSIUM: 4.7 mmol/L (ref 3.5–5.3)
Sodium: 140 mmol/L (ref 135–146)
Total Protein: 7.3 g/dL (ref 6.1–8.1)

## 2017-02-12 ENCOUNTER — Ambulatory Visit (INDEPENDENT_AMBULATORY_CARE_PROVIDER_SITE_OTHER): Payer: Medicare Other | Admitting: Family Medicine

## 2017-02-12 ENCOUNTER — Encounter: Payer: Self-pay | Admitting: Family Medicine

## 2017-02-12 VITALS — BP 118/78 | HR 86 | Resp 16 | Ht 67.0 in | Wt 178.0 lb

## 2017-02-12 DIAGNOSIS — E785 Hyperlipidemia, unspecified: Secondary | ICD-10-CM | POA: Diagnosis not present

## 2017-02-12 DIAGNOSIS — Z1211 Encounter for screening for malignant neoplasm of colon: Secondary | ICD-10-CM

## 2017-02-12 DIAGNOSIS — F29 Unspecified psychosis not due to a substance or known physiological condition: Secondary | ICD-10-CM

## 2017-02-12 DIAGNOSIS — I10 Essential (primary) hypertension: Secondary | ICD-10-CM | POA: Diagnosis not present

## 2017-02-12 MED ORDER — ALBUTEROL SULFATE HFA 108 (90 BASE) MCG/ACT IN AERS: 2.0000 | INHALATION_SPRAY | Freq: Four times a day (QID) | RESPIRATORY_TRACT | 2 refills | Status: DC | PRN

## 2017-02-12 NOTE — Patient Instructions (Addendum)
PSA Sept 15 or after, please also come for flu vaccine in September . Flu clinic  Wellness with nurse Dec 28 or after  MD follow up in January, call if you need me before  Excellent labs and exam  Rectal exam today  Albuterol sent in today   It is important that you exercise regularly at least 30 minutes 5 times a week. If you develop chest pain, have severe difficulty breathing, or feel very tired, stop exercising immediately and seek medical attention    Thank you  for choosing [facility]. We consider it a privelige to serve you.  Delivering excellent health care in a caring and  compassionate way is our goal.  Partnering with you,  so that together we can achieve this goal is our strategy.

## 2017-02-13 DIAGNOSIS — Z1211 Encounter for screening for malignant neoplasm of colon: Secondary | ICD-10-CM | POA: Insufficient documentation

## 2017-02-13 LAB — POC HEMOCCULT BLD/STL (OFFICE/1-CARD/DIAGNOSTIC): FECAL OCCULT BLD: NEGATIVE

## 2017-02-13 NOTE — Assessment & Plan Note (Signed)
Heme negative stool 

## 2017-02-13 NOTE — Assessment & Plan Note (Signed)
Followed by endocrinology 

## 2017-02-13 NOTE — Assessment & Plan Note (Signed)
Stable and treated by psychiatry 

## 2017-02-13 NOTE — Assessment & Plan Note (Signed)
Controlled, no change in medication DASH diet and commitment to daily physical activity for a minimum of 30 minutes discussed and encouraged, as a part of hypertension management. The importance of attaining a healthy weight is also discussed.  BP/Weight 02/12/2017 08/28/2016 05/17/2016 01/31/2016 08/29/2015 04/12/2015 03/21/2015  Systolic BP 118 122 114 120 124 120 122  Diastolic BP 78 82 82 80 72 76 72  Wt. (Lbs) 178 178.12 182 181.12 181 171 173.04  BMI 27.88 27.9 28.51 28.36 28.34 26.78 27.1

## 2017-02-13 NOTE — Assessment & Plan Note (Signed)
Hyperlipidemia:Low fat diet discussed and encouraged.   Lipid Panel  Lab Results  Component Value Date   CHOL 145 01/21/2017   HDL 41 01/21/2017   LDLCALC 86 01/21/2017   TRIG 92 01/21/2017   CHOLHDL 3.5 01/21/2017   Controlled, no change in medication

## 2017-02-13 NOTE — Progress Notes (Signed)
   Terry PippinsMark Ast     MRN: [id number]      DOB: [birth year]   HPI [name] is here for follow up and re-evaluation of chronic medical conditions, medication management and review of any available recent lab and radiology data.  Preventive health is updated, specifically  Cancer screening and Immunization.    The PT denies any adverse reactions to current medications since the last visit.  There are no new concerns.  There are no specific complaints   ROS Denies recent fever or chills. Denies sinus pressure, nasal congestion, ear pain or sore throat. Denies chest congestion, productive cough or wheezing. Denies chest pains, palpitations and leg swelling Denies abdominal pain, nausea, vomiting,diarrhea or constipation.   Denies dysuria, frequency, hesitancy or incontinence. Denies joint pain, swelling and limitation in mobility. Denies headaches, seizures, numbness, or tingling. Denies uncontrolled depression, anxiety or insomnia. Denies skin break down or rash.   PE  BP 118/78   Pulse 86   Resp 16   Ht 5\' 7"  (1.702 m)   Wt 178 lb (80.7 kg)   SpO2 97%   BMI 27.88 kg/m   Patient alert and oriented and in no cardiopulmonary distress.  HEENT: No facial asymmetry, EOMI,   oropharynx pink and moist.  Neck supple no JVD, no mass.  Chest: Clear to auscultation bilaterally.  CVS: S1, S2 no murmurs, no S3.Regular rate.  ABD: Soft non tender. No organomegaly or mass, normal BS Rectal: no mass. Prostate smooth and firm. Heme negative stool  Ext: No edema  MS: Adequate ROM spine, shoulders, hips and knees.  Skin: Intact, no ulcerations or rash noted.  Psych: Good eye contact, normal affect. Memory intact not anxious or depressed appearing.  CNS: CN 2-12 intact, power,  normal throughout.no focal deficits noted.   Assessment & Plan  Essential hypertension Controlled, no change in medication DASH diet and commitment to daily physical activity for a minimum of 30 minutes  discussed and encouraged, as a part of hypertension management. The importance of attaining a healthy weight is also discussed.  BP/Weight 02/12/2017 08/28/2016 05/17/2016 01/31/2016 08/29/2015 04/12/2015 03/21/2015  Systolic BP 118 122 114 120 124 120 122  Diastolic BP 78 82 82 80 72 76 72  Wt. (Lbs) 178 178.12 182 181.12 181 171 173.04  BMI 27.88 27.9 28.51 28.36 28.34 26.78 27.1       Hyperlipidemia LDL goal <100 Hyperlipidemia:Low fat diet discussed and encouraged.   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

## 2017-02-13 NOTE — Addendum Note (Signed)
Addended by: Abner GreenspanHUDY, BRANDI H on: 02/13/2017 07:54 AM   Modules accepted: Orders

## 2017-03-03 ENCOUNTER — Other Ambulatory Visit: Payer: Self-pay

## 2017-03-20 DIAGNOSIS — F919 Conduct disorder, unspecified: Secondary | ICD-10-CM | POA: Diagnosis not present

## 2017-04-22 ENCOUNTER — Other Ambulatory Visit: Payer: Self-pay | Admitting: Family Medicine

## 2017-05-10 ENCOUNTER — Other Ambulatory Visit: Payer: Self-pay | Admitting: Family Medicine

## 2017-05-12 ENCOUNTER — Ambulatory Visit (INDEPENDENT_AMBULATORY_CARE_PROVIDER_SITE_OTHER): Payer: Medicare Other

## 2017-05-12 DIAGNOSIS — Z23 Encounter for immunization: Secondary | ICD-10-CM

## 2017-05-12 NOTE — Telephone Encounter (Signed)
Seen [phone] 

## 2017-06-03 ENCOUNTER — Ambulatory Visit (INDEPENDENT_AMBULATORY_CARE_PROVIDER_SITE_OTHER): Payer: Medicare Other | Admitting: Family Medicine

## 2017-06-03 ENCOUNTER — Encounter: Payer: Self-pay | Admitting: Family Medicine

## 2017-06-03 VITALS — BP 122/70 | HR 101 | Temp 97.6°F | Resp 16 | Ht 67.0 in | Wt 172.2 lb

## 2017-06-03 DIAGNOSIS — I10 Essential (primary) hypertension: Secondary | ICD-10-CM

## 2017-06-03 DIAGNOSIS — F29 Unspecified psychosis not due to a substance or known physiological condition: Secondary | ICD-10-CM

## 2017-06-03 DIAGNOSIS — E785 Hyperlipidemia, unspecified: Secondary | ICD-10-CM | POA: Diagnosis not present

## 2017-06-03 DIAGNOSIS — Z125 Encounter for screening for malignant neoplasm of prostate: Secondary | ICD-10-CM | POA: Diagnosis not present

## 2017-06-03 NOTE — Patient Instructions (Addendum)
Wellness visit with nurse needed, last was 12/27 /2017, mom has one scheduled 06/25/2017 , pls see if they camn come on same day  MD f/u in 6 months   CBC, fasting lipid, cmp and EGFR,  PSA  In October please  No changes in medication    Please work on good  health habits so that your health will improve. 1. Commitment to daily physical activity for 30 to 60  minutes, if you are able to do this.  2. Commitment to wise food choices. Aim for half of your  food intake to be vegetable and fruit, one quarter starchy foods, and one quarter protein. Try to eat on a regular schedule  3 meals per day, snacking between meals should be limited to vegetables or fruits or small portions of nuts. 64 ounces of water per day is generally recommended, unless you have specific health conditions, like heart failure or kidney failure where you will need to limit fluid intake.  3. Commitment to sufficient and a  good quality of physical and mental rest daily, generally between 6 to 8 hours per day.  WITH PERSISTANCE AND PERSEVERANCE, THE IMPOSSIBLE , BECOMES THE NORM! It is important that you exercise regularly at least 30 minutes 5 times a week. If you develop chest pain, have severe difficulty breathing, or feel very tired, stop exercising immediately and seek medical attention

## 2017-06-09 NOTE — Assessment & Plan Note (Signed)
Followed by endocrine, and is stable

## 2017-06-09 NOTE — Assessment & Plan Note (Signed)
Hyperlipidemia:Low fat diet discussed and encouraged.   Lipid Panel  Lab Results  Component Value Date   CHOL 145 01/21/2017   HDL 41 01/21/2017   LDLCALC 86 01/21/2017   TRIG 92 01/21/2017   CHOLHDL 3.5 01/21/2017  Updated lab needed at/ before next visit.

## 2017-06-09 NOTE — Assessment & Plan Note (Signed)
Controlled, no change in medication DASH diet and commitment to daily physical activity for a minimum of 30 minutes discussed and encouraged, as a part of hypertension management. The importance of attaining a healthy weight is also discussed.  BP/Weight 06/03/2017 02/12/2017 08/28/2016 05/17/2016 01/31/2016 08/29/2015 04/12/2015  Systolic BP 122 118 122 114 120 124 120  Diastolic BP 70 78 82 82 80 72 76  Wt. (Lbs) 172.25 178 178.12 182 181.12 181 171  BMI 26.98 27.88 27.9 28.51 28.36 28.34 26.78

## 2017-06-09 NOTE — Progress Notes (Signed)
   Terry Soto     MRN: [id number]      DOB: September 17, 1965   HPI Terry Soto is here for follow up and re-evaluation of chronic medical conditions, medication management and review of any available recent lab and radiology data.  Preventive health is updated, specifically  Cancer screening and Immunization.   Questions or concerns regarding consultations or procedures which the PT has had in the interim are  addressed. The PT denies any adverse reactions to current medications since the last visit.  There are no new concerns.  There are no specific complaints   ROS Denies recent fever or chills. Denies sinus pressure, nasal congestion, ear pain or sore throat. Denies chest congestion, productive cough or wheezing. Denies chest pains, palpitations and leg swelling Denies abdominal pain, nausea, vomiting,diarrhea or constipation.   Denies dysuria, frequency, hesitancy or incontinence. Denies joint pain, swelling and limitation in mobility. Denies headaches, seizures, numbness, or tingling. Denies depression, anxiety or insomnia. Denies skin break down or rash.   PE  BP 122/70 (BP Location: Left Arm, Patient Position: Sitting, Cuff Size: Normal)   Pulse (!) 101   Temp 97.6 F (36.4 C) (Other (Comment))   Resp 16   Ht  (1.702 m)   Wt 172 lb 4 oz (78.1 kg)   SpO2 98%   BMI 26.98 kg/m   Patient alert and oriented and in no cardiopulmonary distress.  HEENT: No facial asymmetry, EOMI,   oropharynx pink and moist.  Neck supple no JVD, no mass.  Chest: Clear to auscultation bilaterally.  CVS: S1, S2 no murmurs, no S3.Regular rate.  ABD: Soft non tender.   Ext: No edema  MS: Adequate ROM spine, shoulders, hips and knees.  Skin: Intact, no ulcerations or rash noted.  Psych: Good eye contact, flat  affect. Memory intact not anxious or depressed appearing.  CNS: CN 2-12 intact, power,  normal throughout.no focal deficits noted.   Assessment & Plan  Essential  hypertension Controlled, no change in medication DASH diet and commitment to daily physical activity for a minimum of 30 minutes discussed and encouraged, as a part of hypertension management. The importance of attaining a healthy weight is also discussed.  BP/Weight 06/03/2017 02/12/2017 08/28/2016 05/17/2016 01/31/2016 08/29/2015 04/12/2015  Systolic BP 122 118 122 114 120 124 120  Diastolic BP 70 78 82 82 80 72 76  Wt. (Lbs) 172.25 178 178.12 182 181.12 181 171  BMI 26.98 27.88 27.9 28.51 28.36 28.34 26.78       Hyperlipidemia LDL goal <100 Hyperlipidemia:Low fat diet discussed and encouraged.   Lipid Panel  Lab Results  Component Value Date   CHOL 145 01/21/2017   HDL 41 01/21/2017   LDLCALC 86 01/21/2017   TRIG 92 01/21/2017   CHOLHDL 3.5 01/21/2017  Updated lab needed at/ before next visit.      HYPERCALCEMIA Followed by endocrine, and is stable  Nonorganic psychosis Followed by psychiatry and is stable

## 2017-06-09 NOTE — Assessment & Plan Note (Signed)
Followed by psychiatry and is stable

## 2017-06-25 ENCOUNTER — Ambulatory Visit: Payer: Medicare Other

## 2017-06-26 DIAGNOSIS — H04123 Dry eye syndrome of bilateral lacrimal glands: Secondary | ICD-10-CM | POA: Diagnosis not present

## 2017-06-26 DIAGNOSIS — H40013 Open angle with borderline findings, low risk, bilateral: Secondary | ICD-10-CM | POA: Diagnosis not present

## 2017-07-09 DIAGNOSIS — I10 Essential (primary) hypertension: Secondary | ICD-10-CM | POA: Diagnosis not present

## 2017-07-09 DIAGNOSIS — E061 Subacute thyroiditis: Secondary | ICD-10-CM | POA: Diagnosis not present

## 2017-08-05 ENCOUNTER — Encounter: Payer: Self-pay | Admitting: Family Medicine

## 2017-08-18 DIAGNOSIS — F919 Conduct disorder, unspecified: Secondary | ICD-10-CM | POA: Diagnosis not present

## 2017-09-01 ENCOUNTER — Ambulatory Visit: Payer: Medicare Other

## 2017-09-01 VITALS — BP 110/70 | HR 92 | Temp 97.8°F | Resp 16 | Ht 67.0 in | Wt 176.0 lb

## 2017-09-01 DIAGNOSIS — Z0000 Encounter for general adult medical examination without abnormal findings: Secondary | ICD-10-CM

## 2017-09-01 NOTE — Progress Notes (Signed)
[name] , Thank you for taking time to come for your Medicare Wellness Visit. I appreciate your ongoing commitment to your health goals. Please review the following plan we discussed and let me know if I can assist you in the future.   Screening recommendations/referrals: Colonoscopy: done Recommended yearly ophthalmology/optometry visit for glaucoma screening and checkup Recommended yearly dental visit for hygiene and checkup  Vaccinations: Influenza vaccine: done Pneumococcal vaccine: discuss with pcp Tdap vaccine: discuss with pcp Shingles vaccine: discuss with pcp  Advanced directives: no   Next appointment: 12/02/17  Preventive Care 40-64 Years, Male Preventive care refers to lifestyle choices and visits with your health care provider that can promote health and wellness. What does preventive care include?  A yearly physical exam. This is also called an annual well check.  Dental exams once or twice a year.  Routine eye exams. Ask your health care provider how often you should have your eyes checked.  Personal lifestyle choices, including:  Daily care of your teeth and gums.  Regular physical activity.  Eating a healthy diet.  Avoiding tobacco and drug use.  Limiting alcohol use.  Practicing safe sex.  Taking low-dose aspirin every day starting at age 50. What happens during an annual well check? The services and screenings done by your health care provider during your annual well check will depend on your age, overall health, lifestyle risk factors, and family history of disease. Counseling  Your health care provider may ask you questions about your:  Alcohol use.  Tobacco use.  Drug use.  Emotional well-being.  Home and relationship well-being.  Sexual activity.  Eating habits.  Work and work Astronomerenvironment. Screening  You may have the following tests or measurements:  Height, weight, and BMI.  Blood pressure.  Lipid and cholesterol levels.  These may be checked every 5 years, or more frequently if you are over 50 years old.  Skin check.  Lung cancer screening. You may have this screening every year starting at age 50 if you have a 30-pack-year history of smoking and currently smoke or have quit within the past 15 years.  Fecal occult blood test (FOBT) of the stool. You may have this test every year starting at age 50.  Flexible sigmoidoscopy or colonoscopy. You may have a sigmoidoscopy every 5 years or a colonoscopy every 10 years starting at age 50.  Prostate cancer screening. Recommendations will vary depending on your family history and other risks.  Hepatitis C blood test.  Hepatitis B blood test.  Sexually transmitted disease (STD) testing.  Diabetes screening. This is done by checking your blood sugar (glucose) after you have not eaten for a while (fasting). You may have this done every 1-3 years. Discuss your test results, treatment options, and if necessary, the need for more tests with your health care provider. Vaccines  Your health care provider may recommend certain vaccines, such as:  Influenza vaccine. This is recommended every year.  Tetanus, diphtheria, and acellular pertussis (Tdap, Td) vaccine. You may need a Td booster every 10 years.  Zoster vaccine. You may need this after age 50.  Pneumococcal 13-valent conjugate (PCV13) vaccine. You may need this if you have certain conditions and have not been vaccinated.  Pneumococcal polysaccharide (PPSV23) vaccine. You may need one or two doses if you smoke cigarettes or if you have certain conditions. Talk to your health care provider about which screenings and vaccines you need and how often you need them. This information is not intended  to replace advice given to you by your health care provider. Make sure you discuss any questions you have with your health care provider. Document Released: 09/15/2015 Document Revised: 05/08/2016 Document Reviewed:  06/20/2015 Elsevier Interactive Patient Education  2017 [place] Prevention in the Home Falls can cause injuries. They can happen to people of all ages. There are many things you can do to make your home safe and to help prevent falls. What can I do on the outside of my home?  Regularly fix the edges of walkways and driveways and fix any cracks.  Remove anything that might make you trip as you walk through a door, such as a raised step or threshold.  Trim any bushes or trees on the path to your home.  Use bright outdoor lighting.  Clear any walking paths of anything that might make someone trip, such as rocks or tools.  Regularly check to see if handrails are loose or broken. Make sure that both sides of any steps have handrails.  Any raised decks and porches should have guardrails on the edges.  Have any leaves, snow, or ice cleared regularly.  Use sand or salt on walking paths during winter.  Clean up any spills in your garage right away. This includes oil or grease spills. What can I do in the bathroom?  Use night lights.  Install grab bars by the toilet and in the tub and shower. Do not use towel bars as grab bars.  Use non-skid mats or decals in the tub or shower.  If you need to sit down in the shower, use a plastic, non-slip stool.  Keep the floor dry. Clean up any water that spills on the floor as soon as it happens.  Remove soap buildup in the tub or shower regularly.  Attach bath mats securely with double-sided non-slip rug tape.  Do not have throw rugs and other things on the floor that can make you trip. What can I do in the bedroom?  Use night lights.  Make sure that you have a light by your bed that is easy to reach.  Do not use any sheets or blankets that are too big for your bed. They should not hang down onto the floor.  Have a firm chair that has side arms. You can use this for support while you get dressed.  Do not have throw rugs  and other things on the floor that can make you trip. What can I do in the kitchen?  Clean up any spills right away.  Avoid walking on wet floors.  Keep items that you use a lot in easy-to-reach places.  If you need to reach something above you, use a strong step stool that has a grab bar.  Keep electrical cords out of the way.  Do not use floor polish or wax that makes floors slippery. If you must use wax, use non-skid floor wax.  Do not have throw rugs and other things on the floor that can make you trip. What can I do with my stairs?  Do not leave any items on the stairs.  Make sure that there are handrails on both sides of the stairs and use them. Fix handrails that are broken or loose. Make sure that handrails are as long as the stairways.  Check any carpeting to make sure that it is firmly attached to the stairs. Fix any carpet that is loose or worn.  Avoid having throw rugs at the top  or bottom of the stairs. If you do have throw rugs, attach them to the floor with carpet tape.  Make sure that you have a light switch at the top of the stairs and the bottom of the stairs. If you do not have them, ask someone to add them for you. What else can I do to help prevent falls?  Wear shoes that:  Do not have high heels.  Have rubber bottoms.  Are comfortable and fit you well.  Are closed at the toe. Do not wear sandals.  If you use a stepladder:  Make sure that it is fully opened. Do not climb a closed stepladder.  Make sure that both sides of the stepladder are locked into place.  Ask someone to hold it for you, if possible.  Clearly Aravind and make sure that you can see:  Any grab bars or handrails.  First and last steps.  Where the edge of each step is.  Use tools that help you move around (mobility aids) if they are needed. These include:  Canes.  Walkers.  Scooters.  Crutches.  Turn on the lights when you go into a dark area. Replace any light bulbs  as soon as they burn out.  Set up your furniture so you have a clear path. Avoid moving your furniture around.  If any of your floors are uneven, fix them.  If there are any pets around you, be aware of where they are.  Review your medicines with your doctor. Some medicines can make you feel dizzy. This can increase your chance of falling. Ask your doctor what other things that you can do to help prevent falls. This information is not intended to replace advice given to you by your health care provider. Make sure you discuss any questions you have with your health care provider. Document Released: 06/15/2009 Document Revised: 01/25/2016 Document Reviewed: 09/23/2014 Elsevier Interactive Patient Education  2017 [employer]  Preventive Care for Adults  A healthy lifestyle and preventive care can promote health and wellness. Preventive health guidelines for adults include the following key practices.  . A routine yearly physical is a good way to check with your health care provider about your health and preventive screening. It is a chance to share any concerns and updates on your health and to receive a thorough exam.  . Visit your dentist for a routine exam and preventive care every 6 months. Brush your teeth twice a day and floss once a day. Good oral hygiene prevents tooth decay and gum disease.  . The frequency of eye exams is based on your age, health, family medical history, use  of contact lenses, and other factors. Follow your health care provider's ecommendations for frequency of eye exams.  . Eat a healthy diet. Foods like vegetables, fruits, whole grains, low-fat dairy products, and lean protein foods contain the nutrients you need without too many calories. Decrease your intake of foods high in solid fats, added sugars, and salt. Eat the right amount of calories for you. Get information about a proper diet from your health care provider, if necessary.  . Regular physical  exercise is one of the most important things you can do for your health. Most adults should get at least 150 minutes of moderate-intensity exercise (any activity that increases your heart rate and causes you to sweat) each week. In addition, most adults need muscle-strengthening exercises on 2 or more days a week.  Silver Sneakers may be a benefit available  to you. To determine eligibility, you may visit the website: www.silversneakers.com or contact program at 670-882-5674 Mon-Fri between 8AM-8PM.   . Maintain a healthy weight. The body mass index (BMI) is a screening tool to identify possible weight problems. It provides an estimate of body fat based on height and weight. Your health care provider can find your BMI and can help you achieve or maintain a healthy weight.   For adults 20 years and older: ? A BMI below 18.5 is considered underweight. ? A BMI of 18.5 to 24.9 is normal. ? A BMI of 25 to 29.9 is considered overweight. ? A BMI of 30 and above is considered obese.   . Maintain normal blood lipids and cholesterol levels by exercising and minimizing your intake of saturated fat. Eat a balanced diet with plenty of fruit and vegetables. Blood tests for lipids and cholesterol should begin at age 37 and be repeated every 5 years. If your lipid or cholesterol levels are high, you are over 50, or you are at high risk for heart disease, you may need your cholesterol levels checked more frequently. Ongoing high lipid and cholesterol levels should be treated with medicines if diet and exercise are not working.  . If you smoke, find out from your health care provider how to quit. If you do not use tobacco, please do not start.  . If you choose to drink alcohol, please do not consume more than 2 drinks per day. One drink is considered to be 12 ounces (355 mL) of beer, 5 ounces (148 mL) of wine, or 1.5 ounces (44 mL) of liquor.  . If you are 71-44 years old, ask your health care provider if you  should take aspirin to prevent strokes.  . Use sunscreen. Apply sunscreen liberally and repeatedly throughout the day. You should seek shade when your shadow is shorter than you. Protect yourself by wearing long sleeves, pants, a wide-brimmed hat, and sunglasses year round, whenever you are outdoors.  . Once a month, do a whole body skin exam, using a mirror to look at the skin on your back. Tell your health care provider of new moles, moles that have irregular borders, moles that are larger than a pencil eraser, or moles that have changed in shape or color.
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
## 2017-10-22 ENCOUNTER — Other Ambulatory Visit: Payer: Self-pay | Admitting: Family Medicine

## 2017-10-27 DIAGNOSIS — K219 Gastro-esophageal reflux disease without esophagitis: Secondary | ICD-10-CM | POA: Diagnosis not present

## 2017-10-27 DIAGNOSIS — F70 Mild intellectual disabilities: Secondary | ICD-10-CM | POA: Diagnosis not present

## 2017-10-27 DIAGNOSIS — R251 Tremor, unspecified: Secondary | ICD-10-CM | POA: Diagnosis not present

## 2017-10-27 DIAGNOSIS — I10 Essential (primary) hypertension: Secondary | ICD-10-CM | POA: Diagnosis not present

## 2017-11-10 ENCOUNTER — Other Ambulatory Visit: Payer: Self-pay | Admitting: Family Medicine

## 2017-11-24 DIAGNOSIS — I10 Essential (primary) hypertension: Secondary | ICD-10-CM | POA: Diagnosis not present

## 2017-11-24 DIAGNOSIS — Z125 Encounter for screening for malignant neoplasm of prostate: Secondary | ICD-10-CM | POA: Diagnosis not present

## 2017-11-24 DIAGNOSIS — E785 Hyperlipidemia, unspecified: Secondary | ICD-10-CM | POA: Diagnosis not present

## 2017-11-25 LAB — COMPLETE METABOLIC PANEL WITH GFR
AG Ratio: 1.6 (calc) (ref 1.0–2.5)
ALT: 24 U/L (ref 9–46)
AST: 19 U/L (ref 10–35)
Albumin: 4.4 g/dL (ref 3.6–5.1)
Alkaline phosphatase (APISO): 74 U/L (ref 40–115)
BUN: 8 mg/dL (ref 7–25)
CALCIUM: 10.7 mg/dL — AB (ref 8.6–10.3)
CO2: 31 mmol/L (ref 20–32)
Chloride: 103 mmol/L (ref 98–110)
Creat: 0.87 mg/dL (ref 0.70–1.33)
GFR, EST NON AFRICAN AMERICAN: 101 mL/min/{1.73_m2} (ref >=60)
GFR, Est African American: 117 mL/min/{1.73_m2} (ref >=60)
GLUCOSE: 98 mg/dL (ref 65–99)
Globulin: 2.7 g/dL (calc) (ref 1.9–3.7)
POTASSIUM: 4.3 mmol/L (ref 3.5–5.3)
Sodium: 140 mmol/L (ref 135–146)
Total Bilirubin: 0.3 mg/dL (ref 0.2–1.2)
Total Protein: 7.1 g/dL (ref 6.1–8.1)

## 2017-11-25 LAB — CBC
HCT: 43 % (ref 38.5–50.0)
Hemoglobin: 14.3 g/dL (ref 13.2–17.1)
MCH: 28.9 pg (ref 27.0–33.0)
MCHC: 33.3 g/dL (ref 32.0–36.0)
MCV: 87 fL (ref 80.0–100.0)
MPV: 10.1 fL (ref 7.5–12.5)
PLATELETS: 308 10*3/uL (ref 140–400)
RBC: 4.94 10*6/uL (ref 4.20–5.80)
RDW: 12.8 % (ref 11.0–15.0)
WBC: 6.4 10*3/uL (ref 3.8–10.8)

## 2017-11-25 LAB — LIPID PANEL
CHOLESTEROL: 142 mg/dL (ref <200)
HDL: 45 mg/dL (ref >40)
LDL Cholesterol (Calc): 83 mg/dL (calc)
Non-HDL Cholesterol (Calc): 97 mg/dL (calc) (ref <130)
Total CHOL/HDL Ratio: 3.2 (calc) (ref <5.0)
Triglycerides: 63 mg/dL (ref <150)

## 2017-12-02 ENCOUNTER — Ambulatory Visit (INDEPENDENT_AMBULATORY_CARE_PROVIDER_SITE_OTHER): Payer: Medicare Other | Admitting: Family Medicine

## 2017-12-02 ENCOUNTER — Encounter: Payer: Self-pay | Admitting: Family Medicine

## 2017-12-02 VITALS — BP 124/82 | HR 81 | Resp 16 | Ht 67.0 in | Wt 175.1 lb

## 2017-12-02 DIAGNOSIS — I10 Essential (primary) hypertension: Secondary | ICD-10-CM

## 2017-12-02 DIAGNOSIS — E785 Hyperlipidemia, unspecified: Secondary | ICD-10-CM | POA: Diagnosis not present

## 2017-12-02 DIAGNOSIS — F29 Unspecified psychosis not due to a substance or known physiological condition: Secondary | ICD-10-CM

## 2017-12-02 MED ORDER — ALBUTEROL SULFATE HFA 108 (90 BASE) MCG/ACT IN AERS: 2.0000 | INHALATION_SPRAY | Freq: Four times a day (QID) | RESPIRATORY_TRACT | 3 refills | Status: DC | PRN

## 2017-12-02 NOTE — Patient Instructions (Signed)
F/U end August or early September, same day and time as his Mom, Lyla SonCarrie   Excellent blood pressure and labs, no changes in medication   Keep active , and eat a lot of vegetables and fruits and drink more water and less mountain Dew, you are doing GREAT!!!

## 2017-12-04 ENCOUNTER — Encounter: Payer: Self-pay | Admitting: Family Medicine

## 2017-12-04 NOTE — Assessment & Plan Note (Signed)
Hyperlipidemia:Low fat diet discussed and encouraged.   Lipid Panel  Lab Results  Component Value Date   CHOL 142 11/24/2017   HDL 45 11/24/2017   LDLCALC 83 11/24/2017   TRIG 63 11/24/2017   CHOLHDL 3.2 11/24/2017   Controlled, no change in medication

## 2017-12-04 NOTE — Progress Notes (Signed)
   Terry PippinsMark Soto     MRN: [id number]      DOB: [birth year]   HPI [name] is here for follow up and re-evaluation of chronic medical conditions, medication management and review of any available recent lab and radiology data.  Preventive health is updated, specifically  Cancer screening and Immunization.   Seeing his same psychiatrist , reports doing well and his Mother has no concerns The PT denies any adverse reactions to current medications since the last visit.  There are no new concerns.  There are no specific complaints   ROS Pt has  Mental retardation and dysarthria, Mother is at visit , but not in the room with patient , per his request, she is asked specifically about concerns , and has none. Denies recent fever or chills. Denies sinus pressure, nasal congestion, ear pain or sore throat. Denies chest congestion, productive cough or wheezing. Denies chest pains, palpitations and leg swelling Denies abdominal pain, nausea, vomiting,diarrhea or constipation.   Denies dysuria, frequency, hesitancy or incontinence. Denies joint pain, swelling and limitation in mobility. Denies headaches, seizures, numbness, or tingling. Denies depression, anxiety or insomnia. Denies skin break down or rash.   PE  BP 124/82   Pulse 81   Resp 16   Ht 5\' 7"  (1.702 m)   Wt 175 lb 1.9 oz (79.4 kg)   SpO2 98%   BMI 27.43 kg/m   Patient alert and in no cardiopulmonary distress.  HEENT: No facial asymmetry, EOMI,   oropharynx pink and moist.  Neck supple no JVD, no mass.  Chest: Clear to auscultation bilaterally.  CVS: S1, S2 no murmurs, no S3.Regular rate.  ABD: Soft non tender.   Ext: No edema  MS: Adequate ROM spine, shoulders, hips and knees.  Skin: Intact, no ulcerations or rash noted.  Psych: Good eye contact, normal affect. not anxious or depressed appearing.  CNS: CN 2-12 intact, power,  normal throughout.no focal deficits noted.   Assessment & Plan  Essential  hypertension Controlled, no change in medication DASH diet and commitment to daily physical activity for a minimum of 30 minutes discussed and encouraged, as a part of hypertension management. The importance of attaining a healthy weight is also discussed.  BP/Weight 12/02/2017 09/01/2017 06/03/2017 02/12/2017 08/28/2016 05/17/2016 01/31/2016  Systolic BP 124 110 122 118 122 114 120  Diastolic BP 82 70 70 78 82 82 80  Wt. (Lbs) 175.12 176.04 172.25 178 178.12 182 181.12  BMI 27.43 27.57 26.98 27.88 27.9 28.51 28.36       Hyperlipidemia LDL goal <100 Hyperlipidemia:Low fat diet discussed and encouraged.   Lipid Panel  Lab Results  Component Value Date   CHOL 142 11/24/2017   HDL 45 11/24/2017   LDLCALC 83 11/24/2017   TRIG 63 11/24/2017   CHOLHDL 3.2 11/24/2017   Controlled, no change in medication     Nonorganic psychosis Stable and controlled on current medication. Treed by psychiatry

## 2017-12-04 NOTE — Assessment & Plan Note (Signed)
Controlled, no change in medication DASH diet and commitment to daily physical activity for a minimum of 30 minutes discussed and encouraged, as a part of hypertension management. The importance of attaining a healthy weight is also discussed.  BP/Weight 12/02/2017 09/01/2017 06/03/2017 02/12/2017 08/28/2016 05/17/2016 01/31/2016  Systolic BP 124 110 122 118 122 114 120  Diastolic BP 82 70 70 78 82 82 80  Wt. (Lbs) 175.12 176.04 172.25 178 178.12 182 181.12  BMI 27.43 27.57 26.98 27.88 27.9 28.51 28.36

## 2017-12-04 NOTE — Assessment & Plan Note (Signed)
Stable and controlled on current medication. Treed by psychiatry

## 2017-12-22 ENCOUNTER — Ambulatory Visit: Payer: Medicare Other | Admitting: Family Medicine

## 2017-12-23 ENCOUNTER — Ambulatory Visit (INDEPENDENT_AMBULATORY_CARE_PROVIDER_SITE_OTHER): Payer: Medicare Other | Admitting: Family Medicine

## 2017-12-23 ENCOUNTER — Encounter: Payer: Self-pay | Admitting: Family Medicine

## 2017-12-23 VITALS — BP 112/80 | HR 125 | Resp 16 | Ht 67.0 in | Wt 173.0 lb

## 2017-12-23 DIAGNOSIS — H6120 Impacted cerumen, unspecified ear: Secondary | ICD-10-CM | POA: Insufficient documentation

## 2017-12-23 DIAGNOSIS — I10 Essential (primary) hypertension: Secondary | ICD-10-CM

## 2017-12-23 DIAGNOSIS — J309 Allergic rhinitis, unspecified: Secondary | ICD-10-CM | POA: Diagnosis not present

## 2017-12-23 DIAGNOSIS — J209 Acute bronchitis, unspecified: Secondary | ICD-10-CM

## 2017-12-23 DIAGNOSIS — H6123 Impacted cerumen, bilateral: Secondary | ICD-10-CM | POA: Diagnosis not present

## 2017-12-23 MED ORDER — BENZONATATE 100 MG PO CAPS: 100.0000 mg | ORAL_CAPSULE | Freq: Two times a day (BID) | ORAL | 0 refills | Status: DC | PRN

## 2017-12-23 MED ORDER — PENICILLIN V POTASSIUM 500 MG PO TABS: 500.0000 mg | ORAL_TABLET | Freq: Three times a day (TID) | ORAL | 0 refills | Status: DC

## 2017-12-23 MED ORDER — MONTELUKAST SODIUM 10 MG PO TABS: 10.0000 mg | ORAL_TABLET | Freq: Every day | ORAL | 1 refills | Status: DC

## 2017-12-23 MED ORDER — PREDNISONE 5 MG PO TABS: ORAL_TABLET | ORAL | 0 refills | Status: DC

## 2017-12-23 NOTE — Patient Instructions (Signed)
F/U as before , call if you need me sooner..   You are trated for acute bronchitis and uncontrolled allergies, medications are sent in

## 2017-12-23 NOTE — Assessment & Plan Note (Signed)
Controlled, no change in medication  

## 2017-12-23 NOTE — Assessment & Plan Note (Addendum)
3 dayh/o cough and congestion.will rx prednisone x 5 days, Z pack and tessalon perles

## 2017-12-23 NOTE — Progress Notes (Signed)
   Terry PippinsMark Soto     MRN: [id number]      DOB: 04/04/1966   HPI [name] is here with  3 day h/o cough. No sputum , fever or chills, no nasal drainage, ear pain c/o  sore throat  C/o increased post nasal drainage worse at night  ROS See HPI  Denies chest pains, palpitations and leg swelling Denies abdominal pain, nausea, vomiting,diarrhea or constipation.   Denies dysuria, frequency, hesitancy or incontinence. Denies joint pain, swelling and limitation in mobility. Denies headaches, seizures, numbness, or tingling. Denies depression, anxiety or insomnia. Denies skin break down or rash.   PE  BP 112/80   Pulse (!) 125   Resp 16   Ht 5\' 7"  (1.702 m)   Wt 173 lb (78.5 kg)   SpO2 97%   BMI 27.10 kg/m   Patient alert and oriented and in no cardiopulmonary distress.  HEENT: No facial asymmetry, EOMI,   oropharynx pink and moist.  Neck supple left cervical adenitis, left TM occluded by cerumen, right partly occluded  Chest: adequate air entry basilar crackles, no wheezes  CVS: S1, S2 no murmurs, no S3.Regular rate.  ABD: Soft non tender.   Ext: No edema  MS: Adequate ROM spine, shoulders, hips and knees.  Skin: Intact, no ulcerations or rash noted.  Psych: Good eye contact, normal affect. Memory intact not anxious or depressed appearing.  CNS: CN 2-12 intact, power,  normal throughout.no focal deficits noted.   Assessment & Plan Acute bronchitis 3 dayh/o cough and congestion.will rx prednisone x 5 days, Z pack and tessalon perles  Allergic rhinitis Increased post nasal drainage with cough, will rx singulair x 2 months, refer to ENT  Impacted cerumen Bilateral, left worse than right, refer to ENT for removal  Essential hypertension Controlled, no change in medication

## 2017-12-23 NOTE — Assessment & Plan Note (Signed)
Bilateral, left worse than right, refer to ENT for removal

## 2017-12-23 NOTE — Assessment & Plan Note (Addendum)
Increased post nasal drainage with cough, will rx singulair x 2 months, refer to ENT

## 2018-01-15 DIAGNOSIS — F919 Conduct disorder, unspecified: Secondary | ICD-10-CM | POA: Diagnosis not present

## 2018-01-22 ENCOUNTER — Ambulatory Visit (INDEPENDENT_AMBULATORY_CARE_PROVIDER_SITE_OTHER): Payer: Medicare Other | Admitting: Otolaryngology

## 2018-01-22 DIAGNOSIS — H6123 Impacted cerumen, bilateral: Secondary | ICD-10-CM

## 2018-01-22 DIAGNOSIS — H900 Conductive hearing loss, bilateral: Secondary | ICD-10-CM

## 2018-02-15 ENCOUNTER — Other Ambulatory Visit: Payer: Self-pay | Admitting: Family Medicine

## 2018-03-15 ENCOUNTER — Other Ambulatory Visit: Payer: Self-pay | Admitting: Family Medicine

## 2018-04-08 ENCOUNTER — Other Ambulatory Visit: Payer: Self-pay | Admitting: Family Medicine

## 2018-04-09 ENCOUNTER — Telehealth: Payer: Self-pay | Admitting: Family Medicine

## 2018-04-09 NOTE — Telephone Encounter (Signed)
Pt needs a excuse from jury duty--[place] is working on it

## 2018-04-09 NOTE — Telephone Encounter (Signed)
Pt needs a ex

## 2018-04-12 ENCOUNTER — Other Ambulatory Visit: Payer: Self-pay | Admitting: Family Medicine

## 2018-05-06 ENCOUNTER — Encounter: Payer: Self-pay | Admitting: Family Medicine

## 2018-05-06 ENCOUNTER — Ambulatory Visit (INDEPENDENT_AMBULATORY_CARE_PROVIDER_SITE_OTHER): Payer: Medicare Other | Admitting: Family Medicine

## 2018-05-06 VITALS — BP 120/80 | HR 90 | Resp 16 | Ht 67.0 in | Wt 176.0 lb

## 2018-05-06 DIAGNOSIS — Z125 Encounter for screening for malignant neoplasm of prostate: Secondary | ICD-10-CM | POA: Diagnosis not present

## 2018-05-06 DIAGNOSIS — E079 Disorder of thyroid, unspecified: Secondary | ICD-10-CM | POA: Diagnosis not present

## 2018-05-06 DIAGNOSIS — Z23 Encounter for immunization: Secondary | ICD-10-CM | POA: Diagnosis not present

## 2018-05-06 DIAGNOSIS — E785 Hyperlipidemia, unspecified: Secondary | ICD-10-CM | POA: Diagnosis not present

## 2018-05-06 DIAGNOSIS — I10 Essential (primary) hypertension: Secondary | ICD-10-CM

## 2018-05-06 DIAGNOSIS — Z1211 Encounter for screening for malignant neoplasm of colon: Secondary | ICD-10-CM | POA: Diagnosis not present

## 2018-05-06 DIAGNOSIS — F29 Unspecified psychosis not due to a substance or known physiological condition: Secondary | ICD-10-CM

## 2018-05-06 DIAGNOSIS — E559 Vitamin D deficiency, unspecified: Secondary | ICD-10-CM | POA: Diagnosis not present

## 2018-05-06 NOTE — Progress Notes (Signed)
   Terry Soto     MRN: [id number]      DOB: Jul 23, 1966   HPI Terry Soto is here for follow up and re-evaluation of chronic medical conditions, medication management and review of any available recent lab and radiology data.  Preventive health is updated, specifically  Cancer screening and Immunization.   Sees Psychiatry regularly, and has not established  With a new endocrinologist since Terry Soto retired nearly one year ago The PT denies any adverse reactions to current medications since the last visit.  There are no new concerns.  There are no specific complaints   ROS Denies recent fever or chills. Denies sinus pressure, nasal congestion, ear pain or sore throat. Denies chest congestion, productive cough or wheezing. Denies chest pains, palpitations and leg swelling Denies abdominal pain, nausea, vomiting,diarrhea or constipation.   Denies dysuria, frequency, hesitancy or incontinence. Denies joint pain, swelling and limitation in mobility. Denies headaches, seizures, numbness, or tingling. Denies depression, anxiety or insomnia. Denies skin break down or rash.   PE  BP 140/86   Pulse (!) 105   Resp 16   Ht 5\' 7"  (1.702 m)   Wt 176 lb (79.8 kg)   SpO2 99%   BMI 27.57 kg/m   Patient alert and oriented and in no cardiopulmonary distress.  HEENT: No facial asymmetry, EOMI,   oropharynx pink and moist.  Neck supple no JVD, no mass.  Chest: Clear to auscultation bilaterally.  CVS: S1, S2 no murmurs, no S3.Regular rate.  ABD: Soft non tender.   Ext: No edema  MS: Adequate ROM spine, shoulders, hips and knees.  Skin: Intact, no ulcerations or rash noted.  Psych: Good eye contact, blunted  affect. Memory  impaired not anxious or depressed appearing.  CNS: CN 2-12 intact, power,  normal throughout.no focal deficits noted.   Assessment & Plan  Essential hypertension Sub optimal control, no med change DASH diet and commitment to daily physical activity for a minimum of  30 minutes discussed and encouraged, as a part of hypertension management. The importance of attaining a healthy weight is also discussed.  BP/Weight 05/06/2018 12/23/2017 12/02/2017 09/01/2017 06/03/2017 02/12/2017 08/28/2016  Systolic BP 120 112 124 110 122 118 122  Diastolic BP 80 80 82 70 70 78 82  Wt. (Lbs) 176 173 175.12 176.04 172.25 178 178.12  BMI 27.57 27.1 27.43 27.57 26.98 27.88 27.9       Disorder of thyroid Repeat TSH, if abnormal will refer to endo locally  He was being followed for hypercalcemia in the past by Terry Soto and is only prescribed Vit D  Hyperlipidemia LDL goal <100 Hyperlipidemia:Low fat diet discussed and encouraged.   Lipid Panel  Lab Results  Component Value Date   CHOL 142 11/24/2017   HDL 45 11/24/2017   LDLCALC 83 11/24/2017   TRIG 63 11/24/2017   CHOLHDL 3.2 11/24/2017     Updated lab needed asap  Nonorganic psychosis stable and managed by psychiatry

## 2018-05-06 NOTE — Patient Instructions (Addendum)
Wellness with nurse due Jan 2 or after, please schedule  MD follow up same day as Mom  Flu vaccine today  Fasting lipid , cmp and EGFR, TSH, Vit D , PSA asap  You may be referred to local thyroid Specialist , will let you know  Cologuard test to be arranged from home

## 2018-05-07 ENCOUNTER — Encounter: Payer: Self-pay | Admitting: Family Medicine

## 2018-05-07 NOTE — Assessment & Plan Note (Signed)
Repeat TSH, if abnormal will refer to endo locally  He was being followed for hypercalcemia in the past by Dr Kemper Durie and is only prescribed Vit D

## 2018-05-07 NOTE — Assessment & Plan Note (Signed)
Hyperlipidemia:Low fat diet discussed and encouraged.   Lipid Panel  Lab Results  Component Value Date   CHOL 142 11/24/2017   HDL 45 11/24/2017   LDLCALC 83 11/24/2017   TRIG 63 11/24/2017   CHOLHDL 3.2 11/24/2017     Updated lab needed asap

## 2018-05-07 NOTE — Assessment & Plan Note (Signed)
Sub optimal control, no med change DASH diet and commitment to daily physical activity for a minimum of 30 minutes discussed and encouraged, as a part of hypertension management. The importance of attaining a healthy weight is also discussed.  BP/Weight 05/06/2018 12/23/2017 12/02/2017 09/01/2017 06/03/2017 02/12/2017 08/28/2016  Systolic BP 120 112 124 110 122 118 122  Diastolic BP 80 80 82 70 70 78 82  Wt. (Lbs) 176 173 175.12 176.04 172.25 178 178.12  BMI 27.57 27.1 27.43 27.57 26.98 27.88 27.9

## 2018-05-07 NOTE — Assessment & Plan Note (Signed)
stable and managed by psychiatry

## 2018-05-08 ENCOUNTER — Other Ambulatory Visit: Payer: Self-pay | Admitting: Family Medicine

## 2018-05-21 ENCOUNTER — Other Ambulatory Visit: Payer: Self-pay | Admitting: Family Medicine

## 2018-05-25 LAB — COLOGUARD: COLOGUARD: NEGATIVE

## 2018-06-04 ENCOUNTER — Other Ambulatory Visit: Payer: Self-pay | Admitting: Family Medicine

## 2018-06-10 DIAGNOSIS — Z1211 Encounter for screening for malignant neoplasm of colon: Secondary | ICD-10-CM | POA: Diagnosis not present

## 2018-06-15 DIAGNOSIS — F919 Conduct disorder, unspecified: Secondary | ICD-10-CM | POA: Diagnosis not present

## 2018-06-17 LAB — COLOGUARD: Cologuard: NEGATIVE

## 2018-06-27 ENCOUNTER — Other Ambulatory Visit: Payer: Self-pay | Admitting: Family Medicine

## 2018-07-01 ENCOUNTER — Other Ambulatory Visit: Payer: Self-pay | Admitting: Family Medicine

## 2018-07-30 ENCOUNTER — Other Ambulatory Visit: Payer: Self-pay | Admitting: Family Medicine

## 2018-08-06 ENCOUNTER — Ambulatory Visit (INDEPENDENT_AMBULATORY_CARE_PROVIDER_SITE_OTHER): Payer: Medicare Other | Admitting: Otolaryngology

## 2018-08-06 DIAGNOSIS — H6123 Impacted cerumen, bilateral: Secondary | ICD-10-CM

## 2018-08-10 ENCOUNTER — Encounter: Payer: Self-pay | Admitting: Family Medicine

## 2018-08-10 ENCOUNTER — Ambulatory Visit (INDEPENDENT_AMBULATORY_CARE_PROVIDER_SITE_OTHER): Payer: Medicare Other | Admitting: Family Medicine

## 2018-08-10 VITALS — BP 122/86 | HR 93 | Temp 98.1°F | Resp 12 | Ht 66.0 in | Wt 181.1 lb

## 2018-08-10 DIAGNOSIS — E785 Hyperlipidemia, unspecified: Secondary | ICD-10-CM

## 2018-08-10 DIAGNOSIS — I10 Essential (primary) hypertension: Secondary | ICD-10-CM | POA: Diagnosis not present

## 2018-08-10 DIAGNOSIS — R251 Tremor, unspecified: Secondary | ICD-10-CM | POA: Diagnosis not present

## 2018-08-10 NOTE — Patient Instructions (Signed)
F/u in January as before, call if you need me sooner   I have referred you to Dr Gerilyn Pilgrimoonquah re new right hand tremor and his office will call  Please  Get fasting labs before next visit  Thank you  for choosing [facility]. We consider it a privelige to serve you.  Delivering excellent health care in a caring and  compassionate way is our goal.  Partnering with you,  so that together we can achieve this goal is our strategy.

## 2018-08-10 NOTE — Assessment & Plan Note (Addendum)
2 month h/o right hand tremor which is progressively worsening, needs neurology eval, no new meds

## 2018-08-10 NOTE — Progress Notes (Signed)
   Deirdre PippinsMark Babel     MRN: [id number]      DOB: October 11, 1965   HPI [name] is here for follow up and re-evaluation of chronic medical conditions, medication management and review of any available recent lab and radiology data.  Main concern is    2 month h/o right hand tremor progeressively worsening, no new medication, occurs both at rest aand when he is using the hand  ROS Denies recent fever or chills. Denies sinus pressure, nasal congestion, ear pain or sore throat. Denies chest congestion, productive cough or wheezing. Denies chest pains, palpitations and leg swelling Denies abdominal pain, nausea, vomiting,diarrhea or constipation.   Denies dysuria, frequency, hesitancy or incontinence. Denies joint pain, swelling and limitation in mobility. Denies headaches, seizures, numbness, or tingling. Denies depression, anxiety or insomnia. Denies skin break down or rash.   PE  BP 122/86 (BP Location: Left Arm, Patient Position: Sitting, Cuff Size: Large)   Pulse 93   Temp 98.1 F (36.7 C) (Oral)   Resp 12   Ht 5\' 6"  (1.676 m)   Wt 181 lb 1.9 oz (82.2 kg)   SpO2 98% Comment: room air  BMI 29.23 kg/m   Patient alert and oriented and in no cardiopulmonary distress.  HEENT: No facial asymmetry, EOMI,   oropharynx pink and moist.  Neck supple no JVD, no mass.  Chest: Clear to auscultation bilaterally.  CVS: S1, S2 no murmurs, no S3.Regular rate.  ABD: Soft non tender.   Ext: No edema  MS: Adequate ROM spine, shoulders, hips and knees.  Skin: Intact, no ulcerations or rash noted.  Psych: Good eye contact, normal affect. Memory intact not anxious or depressed appearing.  CNS: CN 2-12 intact, power,  normal throughout.no focal deficits noted.Tremor both at rest and with activity noted in right hand   Assessment & Plan  Tremor of right hand 2 month h/o right hand tremor which is progressively worsening, needs neurology eval, no new meds  Essential hypertension Controlled, no  change in medication DASH diet and commitment to daily physical activity for a minimum of 30 minutes discussed and encouraged, as a part of hypertension management. The importance of attaining a healthy weight is also discussed.  BP/Weight 08/10/2018 05/06/2018 12/23/2017 12/02/2017 09/01/2017 06/03/2017 02/12/2017  Systolic BP 122 120 112 124 110 122 118  Diastolic BP 86 80 80 82 70 70 78  Wt. (Lbs) 181.12 176 173 175.12 176.04 172.25 178  BMI 29.23 27.57 27.1 27.43 27.57 26.98 27.88       Hyperlipidemia LDL goal <100 Hyperlipidemia:Low fat diet discussed and encouraged.   Lipid Panel  Lab Results  Component Value Date   CHOL 142 11/24/2017   HDL 45 11/24/2017   LDLCALC 83 11/24/2017   TRIG 63 11/24/2017   CHOLHDL 3.2 11/24/2017

## 2018-08-15 ENCOUNTER — Encounter: Payer: Self-pay | Admitting: Family Medicine

## 2018-08-15 NOTE — Assessment & Plan Note (Signed)
Controlled, no change in medication DASH diet and commitment to daily physical activity for a minimum of 30 minutes discussed and encouraged, as a part of hypertension management. The importance of attaining a healthy weight is also discussed.  BP/Weight 08/10/2018 05/06/2018 12/23/2017 12/02/2017 09/01/2017 06/03/2017 02/12/2017  Systolic BP 122 120 112 124 110 122 118  Diastolic BP 86 80 80 82 70 70 78  Wt. (Lbs) 181.12 176 173 175.12 176.04 172.25 178  BMI 29.23 27.57 27.1 27.43 27.57 26.98 27.88

## 2018-08-15 NOTE — Assessment & Plan Note (Signed)
Hyperlipidemia:Low fat diet discussed and encouraged.   Lipid Panel  Lab Results  Component Value Date   CHOL 142 11/24/2017   HDL 45 11/24/2017   LDLCALC 83 11/24/2017   TRIG 63 11/24/2017   CHOLHDL 3.2 11/24/2017

## 2018-09-07 ENCOUNTER — Ambulatory Visit (INDEPENDENT_AMBULATORY_CARE_PROVIDER_SITE_OTHER): Payer: Medicare Other

## 2018-09-07 VITALS — BP 129/84 | HR 99 | Resp 10 | Ht 66.0 in | Wt 177.0 lb

## 2018-09-07 DIAGNOSIS — Z0000 Encounter for general adult medical examination without abnormal findings: Secondary | ICD-10-CM | POA: Diagnosis not present
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
## 2018-09-07 NOTE — Patient Instructions (Signed)
[name] , Thank you for taking time to come for your Medicare Wellness Visit. I appreciate your ongoing commitment to your health goals. Please review the following plan we discussed and let me know if I can assist you in the future.   Screening recommendations/referrals: Colonoscopy: up to date  Recommended yearly ophthalmology/optometry visit for glaucoma screening and checkup Recommended yearly dental visit for hygiene and checkup  Vaccinations: Influenza vaccine: up to date  Pneumococcal vaccine: N./A  Tdap vaccine: up to date  Shingles vaccine: N/A     Advanced directives: in place   Conditions/risks identified: mental retardation, hypertension   Next appointment: Wellness visit in one year   Preventive Care 40-64 Years, Male Preventive care refers to lifestyle choices and visits with your health care provider that can promote health and wellness. What does preventive care include?  A yearly physical exam. This is also called an annual well check.  Dental exams once or twice a year.  Routine eye exams. Ask your health care provider how often you should have your eyes checked.  Personal lifestyle choices, including:  Daily care of your teeth and gums.  Regular physical activity.  Eating a healthy diet.  Avoiding tobacco and drug use.  Limiting alcohol use.  Practicing safe sex.  Taking low-dose aspirin every day starting at age 52. What happens during an annual well check? The services and screenings done by your health care provider during your annual well check will depend on your age, overall health, lifestyle risk factors, and family history of disease. Counseling  Your health care provider may ask you questions about your:  Alcohol use.  Tobacco use.  Drug use.  Emotional well-being.  Home and relationship well-being.  Sexual activity.  Eating habits.  Work and work Astronomerenvironment. Screening  You may have the following tests or  measurements:  Height, weight, and BMI.  Blood pressure.  Lipid and cholesterol levels. These may be checked every 5 years, or more frequently if you are over 52 years old.  Skin check.  Lung cancer screening. You may have this screening every year starting at age 52 if you have a 30-pack-year history of smoking and currently smoke or have quit within the past 15 years.  Fecal occult blood test (FOBT) of the stool. You may have this test every year starting at age 52.  Flexible sigmoidoscopy or colonoscopy. You may have a sigmoidoscopy every 5 years or a colonoscopy every 10 years starting at age 52.  Prostate cancer screening. Recommendations will vary depending on your family history and other risks.  Hepatitis C blood test.  Hepatitis B blood test.  Sexually transmitted disease (STD) testing.  Diabetes screening. This is done by checking your blood sugar (glucose) after you have not eaten for a while (fasting). You may have this done every 1-3 years. Discuss your test results, treatment options, and if necessary, the need for more tests with your health care provider. Vaccines  Your health care provider may recommend certain vaccines, such as:  Influenza vaccine. This is recommended every year.  Tetanus, diphtheria, and acellular pertussis (Tdap, Td) vaccine. You may need a Td booster every 10 years.  Zoster vaccine. You may need this after age 52.  Pneumococcal 13-valent conjugate (PCV13) vaccine. You may need this if you have certain conditions and have not been vaccinated.  Pneumococcal polysaccharide (PPSV23) vaccine. You may need one or two doses if you smoke cigarettes or if you have certain conditions. Talk to your health  care provider about which screenings and vaccines you need and how often you need them. This information is not intended to replace advice given to you by your health care provider. Make sure you discuss any questions you have with your health care  provider. Document Released: 09/15/2015 Document Revised: 05/08/2016 Document Reviewed: 06/20/2015 Elsevier Interactive Patient Education  2017 [employer].  Fall Prevention in the Home Falls can cause injuries. They can happen to people of all ages. There are many things you can do to make your home safe and to help prevent falls. What can I do on the outside of my home?  Regularly fix the edges of walkways and driveways and fix any cracks.  Remove anything that might make you trip as you walk through a door, such as a raised step or threshold.  Trim any bushes or trees on the path to your home.  Use bright outdoor lighting.  Clear any walking paths of anything that might make someone trip, such as rocks or tools.  Regularly check to see if handrails are loose or broken. Make sure that both sides of any steps have handrails.  Any raised decks and porches should have guardrails on the edges.  Have any leaves, snow, or ice cleared regularly.  Use sand or salt on walking paths during winter.  Clean up any spills in your garage right away. This includes oil or grease spills. What can I do in the bathroom?  Use night lights.  Install grab bars by the toilet and in the tub and shower. Do not use towel bars as grab bars.  Use non-skid mats or decals in the tub or shower.  If you need to sit down in the shower, use a plastic, non-slip stool.  Keep the floor dry. Clean up any water that spills on the floor as soon as it happens.  Remove soap buildup in the tub or shower regularly.  Attach bath mats securely with double-sided non-slip rug tape.  Do not have throw rugs and other things on the floor that can make you trip. What can I do in the bedroom?  Use night lights.  Make sure that you have a light by your bed that is easy to reach.  Do not use any sheets or blankets that are too big for your bed. They should not hang down onto the floor.  Have a firm chair that has  side arms. You can use this for support while you get dressed.  Do not have throw rugs and other things on the floor that can make you trip. What can I do in the kitchen?  Clean up any spills right away.  Avoid walking on wet floors.  Keep items that you use a lot in easy-to-reach places.  If you need to reach something above you, use a strong step stool that has a grab bar.  Keep electrical cords out of the way.  Do not use floor polish or wax that makes floors slippery. If you must use wax, use non-skid floor wax.  Do not have throw rugs and other things on the floor that can make you trip. What can I do with my stairs?  Do not leave any items on the stairs.  Make sure that there are handrails on both sides of the stairs and use them. Fix handrails that are broken or loose. Make sure that handrails are as long as the stairways.  Check any carpeting to make sure that it is firmly  attached to the stairs. Fix any carpet that is loose or worn.  Avoid having throw rugs at the top or bottom of the stairs. If you do have throw rugs, attach them to the floor with carpet tape.  Make sure that you have a light switch at the top of the stairs and the bottom of the stairs. If you do not have them, ask someone to add them for you. What else can I do to help prevent falls?  Wear shoes that:  Do not have high heels.  Have rubber bottoms.  Are comfortable and fit you well.  Are closed at the toe. Do not wear sandals.  If you use a stepladder:  Make sure that it is fully opened. Do not climb a closed stepladder.  Make sure that both sides of the stepladder are locked into place.  Ask someone to hold it for you, if possible.  Clearly Gus and make sure that you can see:  Any grab bars or handrails.  First and last steps.  Where the edge of each step is.  Use tools that help you move around (mobility aids) if they are needed. These  include:  Canes.  Walkers.  Scooters.  Crutches.  Turn on the lights when you go into a dark area. Replace any light bulbs as soon as they burn out.  Set up your furniture so you have a clear path. Avoid moving your furniture around.  If any of your floors are uneven, fix them.  If there are any pets around you, be aware of where they are.  Review your medicines with your doctor. Some medicines can make you feel dizzy. This can increase your chance of falling. Ask your doctor what other things that you can do to help prevent falls. This information is not intended to replace advice given to you by your health care provider. Make sure you discuss any questions you have with your health care provider. Document Released: 06/15/2009 Document Revised: 01/25/2016 Document Reviewed: 09/23/2014 Elsevier Interactive Patient Education  2017 [employer].

## 2018-09-11 DIAGNOSIS — E559 Vitamin D deficiency, unspecified: Secondary | ICD-10-CM | POA: Diagnosis not present

## 2018-09-11 DIAGNOSIS — E785 Hyperlipidemia, unspecified: Secondary | ICD-10-CM | POA: Diagnosis not present

## 2018-09-11 DIAGNOSIS — I10 Essential (primary) hypertension: Secondary | ICD-10-CM | POA: Diagnosis not present

## 2018-09-11 DIAGNOSIS — Z125 Encounter for screening for malignant neoplasm of prostate: Secondary | ICD-10-CM | POA: Diagnosis not present

## 2018-09-12 LAB — COMPLETE METABOLIC PANEL WITH GFR
AG Ratio: 1.5 (calc) (ref 1.0–2.5)
ALKALINE PHOSPHATASE (APISO): 75 U/L (ref 40–115)
ALT: 31 U/L (ref 9–46)
AST: 22 U/L (ref 10–35)
Albumin: 4.4 g/dL (ref 3.6–5.1)
BILIRUBIN TOTAL: 0.4 mg/dL (ref 0.2–1.2)
BUN: 13 mg/dL (ref 7–25)
CHLORIDE: 103 mmol/L (ref 98–110)
CO2: 27 mmol/L (ref 20–32)
Calcium: 10.9 mg/dL — ABNORMAL HIGH (ref 8.6–10.3)
Creat: 0.96 mg/dL (ref 0.70–1.33)
GFR, Est African American: 106 mL/min/{1.73_m2} (ref >=60)
GFR, Est Non African American: 91 mL/min/{1.73_m2} (ref >=60)
GLUCOSE: 101 mg/dL — AB (ref 65–99)
Globulin: 2.9 g/dL (calc) (ref 1.9–3.7)
Potassium: 4.3 mmol/L (ref 3.5–5.3)
Sodium: 139 mmol/L (ref 135–146)
Total Protein: 7.3 g/dL (ref 6.1–8.1)

## 2018-09-12 LAB — LIPID PANEL
CHOL/HDL RATIO: 3.7 (calc) (ref <5.0)
Cholesterol: 146 mg/dL (ref <200)
HDL: 40 mg/dL — ABNORMAL LOW (ref >40)
LDL CHOLESTEROL (CALC): 89 mg/dL
NON-HDL CHOLESTEROL (CALC): 106 mg/dL (ref <130)
TRIGLYCERIDES: 79 mg/dL (ref <150)

## 2018-09-12 LAB — VITAMIN D 25 HYDROXY (VIT D DEFICIENCY, FRACTURES): Vit D, 25-Hydroxy: 36 ng/mL (ref 30–100)

## 2018-09-12 LAB — PSA: PSA: 0.7 ng/mL (ref <=4.0)

## 2018-09-12 LAB — TSH: TSH: 0.26 m[IU]/L — AB (ref 0.40–4.50)

## 2018-09-14 ENCOUNTER — Telehealth: Payer: Self-pay

## 2018-09-14 DIAGNOSIS — F79 Unspecified intellectual disabilities: Secondary | ICD-10-CM

## 2018-09-14 MED ORDER — UNABLE TO FIND: 6 refills | Status: AC

## 2018-09-14 NOTE — Telephone Encounter (Signed)
Gloves ordered

## 2018-09-16 ENCOUNTER — Ambulatory Visit (INDEPENDENT_AMBULATORY_CARE_PROVIDER_SITE_OTHER): Payer: Medicare Other | Admitting: Family Medicine

## 2018-09-16 ENCOUNTER — Encounter: Payer: Self-pay | Admitting: Family Medicine

## 2018-09-16 VITALS — BP 132/90 | HR 98 | Resp 12 | Ht 66.0 in | Wt 176.4 lb

## 2018-09-16 DIAGNOSIS — R251 Tremor, unspecified: Secondary | ICD-10-CM | POA: Diagnosis not present

## 2018-09-16 DIAGNOSIS — J309 Allergic rhinitis, unspecified: Secondary | ICD-10-CM | POA: Diagnosis not present

## 2018-09-16 DIAGNOSIS — E785 Hyperlipidemia, unspecified: Secondary | ICD-10-CM | POA: Diagnosis not present

## 2018-09-16 DIAGNOSIS — I10 Essential (primary) hypertension: Secondary | ICD-10-CM

## 2018-09-16 NOTE — Patient Instructions (Signed)
F/u in early September with MD , call if you need me before   Blood pressure and labs are excellent   No changes in medication   Thank you  for choosing [facility]. We consider it a privelige to serve you.  Delivering excellent health care in a caring and  compassionate way is our goal.  Partnering with you,  so that together we can achieve this goal is our strategy.

## 2018-09-17 ENCOUNTER — Encounter: Payer: Self-pay | Admitting: Family Medicine

## 2018-09-17 NOTE — Assessment & Plan Note (Signed)
Hyperlipidemia:Low fat diet discussed and encouraged.   Lipid Panel  Lab Results  Component Value Date   CHOL 146 09/11/2018   HDL 40 (L) 09/11/2018   LDLCALC 89 09/11/2018   TRIG 79 09/11/2018   CHOLHDL 3.7 09/11/2018   Needs to increase exerise

## 2018-09-17 NOTE — Assessment & Plan Note (Signed)
Controlled, no change in medication DASH diet and commitment to daily physical activity for a minimum of 30 minutes discussed and encouraged, as a part of hypertension management. The importance of attaining a healthy weight is also discussed.  BP/Weight 09/16/2018 09/07/2018 08/10/2018 05/06/2018 12/23/2017 12/02/2017 09/01/2017  Systolic BP 132 129 122 120 112 124 110  Diastolic BP 90 84 86 80 80 82 70  Wt. (Lbs) 176.4 177 181.12 176 173 175.12 176.04  BMI 28.47 28.57 29.23 27.57 27.1 27.43 27.57

## 2018-09-17 NOTE — Assessment & Plan Note (Signed)
Managed by endo

## 2018-09-17 NOTE — Assessment & Plan Note (Signed)
Controlled, no change in medication  

## 2018-09-17 NOTE — Progress Notes (Signed)
   Terry Soto     MRN: [id number]      DOB: 01-Dec-1965   HPI Terry Soto is here for follow up and re-evaluation of chronic medical conditions, medication management and review of any available recent lab and radiology data.  . The PT denies any adverse reactions to current medications since the last visit.  There are no new concerns.  There are no specific complaints   ROS Denies recent fever or chills. Denies sinus pressure, nasal congestion, ear pain or sore throat. Denies chest congestion, productive cough or wheezing. Denies chest pains, palpitations and leg swelling Denies abdominal pain, nausea, vomiting,diarrhea or constipation.   Denies dysuria, frequency, hesitancy or incontinence. Denies joint pain, swelling and limitation in mobility. Denies headaches, seizures, numbness, or tingling. Denies depression, anxiety or insomnia. Denies skin break down or rash.   PE  BP 132/90 (BP Location: Left Arm, Patient Position: Sitting, Cuff Size: Normal)   Pulse 98   Resp 12   Ht 5\' 6"  (1.676 m)   Wt 176 lb 6.4 oz (80 kg)   SpO2 99% Comment: room air  BMI 28.47 kg/m   Patient alert and oriented and in no cardiopulmonary distress.  HEENT: No facial asymmetry, EOMI,   oropharynx pink and moist.  Neck supple no JVD, no mass.  Chest: Clear to auscultation bilaterally.  CVS: S1, S2 no murmurs, no S3.Regular rate.  ABD: Soft non tender.   Ext: No edema  MS: Adequate ROM spine, shoulders, hips and knees.  Skin: Intact, no ulcerations or rash noted.  Psych: Good eye contact, normal affect. Memory intact not anxious or depressed appearing.  CNS: CN 2-12 intact, power,  normal throughout.no focal deficits noted.   Assessment & Plan  Essential hypertension Controlled, no change in medication DASH diet and commitment to daily physical activity for a minimum of 30 minutes discussed and encouraged, as a part of hypertension management. The importance of attaining a healthy  weight is also discussed.  BP/Weight 09/16/2018 09/07/2018 08/10/2018 05/06/2018 12/23/2017 12/02/2017 09/01/2017  Systolic BP 132 129 122 120 112 124 110  Diastolic BP 90 84 86 80 80 82 70  Wt. (Lbs) 176.4 177 181.12 176 173 175.12 176.04  BMI 28.47 28.57 29.23 27.57 27.1 27.43 27.57       Hyperlipidemia LDL goal <100 Hyperlipidemia:Low fat diet discussed and encouraged.   Lipid Panel  Lab Results  Component Value Date   CHOL 146 09/11/2018   HDL 40 (L) 09/11/2018   LDLCALC 89 09/11/2018   TRIG 79 09/11/2018   CHOLHDL 3.7 09/11/2018   Needs to increase exerise    Tremor of right hand Controlled, no change in medication   Hypercalcemia Managed by endo  Allergic rhinitis Controlled, no change in medication

## 2018-09-22 DIAGNOSIS — F70 Mild intellectual disabilities: Secondary | ICD-10-CM | POA: Diagnosis not present

## 2018-09-22 DIAGNOSIS — I10 Essential (primary) hypertension: Secondary | ICD-10-CM | POA: Diagnosis not present

## 2018-09-22 DIAGNOSIS — K219 Gastro-esophageal reflux disease without esophagitis: Secondary | ICD-10-CM | POA: Diagnosis not present

## 2018-09-22 DIAGNOSIS — R251 Tremor, unspecified: Secondary | ICD-10-CM | POA: Diagnosis not present

## 2018-10-06 ENCOUNTER — Other Ambulatory Visit: Payer: Self-pay | Admitting: Family Medicine

## 2018-11-09 DIAGNOSIS — F919 Conduct disorder, unspecified: Secondary | ICD-10-CM | POA: Diagnosis not present

## 2018-11-18 ENCOUNTER — Other Ambulatory Visit: Payer: Self-pay | Admitting: Family Medicine

## 2018-11-19 ENCOUNTER — Other Ambulatory Visit: Payer: Self-pay | Admitting: Family Medicine

## 2018-12-05 ENCOUNTER — Other Ambulatory Visit: Payer: Self-pay | Admitting: Family Medicine

## 2018-12-07 ENCOUNTER — Telehealth: Payer: Self-pay | Admitting: *Deleted

## 2018-12-07 NOTE — Telephone Encounter (Signed)
Pts mother called stating he needed the 81 mg aspirin and the Cholecaliferol refills sent into [facility] in [facility] also stated the arthritis in his hand was acting up wanted to know if he could get one of the wrist supports sent to [facility] to help with this.

## 2018-12-07 NOTE — Telephone Encounter (Signed)
Those meds are over the counter and the wrist braces are available on the shelves at most pharmacies or [facility]

## 2018-12-07 NOTE — Telephone Encounter (Signed)
Left a message with his mother Terry Soto to let her know all of this can be gotten over the counter

## 2018-12-14 DIAGNOSIS — F70 Mild intellectual disabilities: Secondary | ICD-10-CM | POA: Diagnosis not present

## 2018-12-14 DIAGNOSIS — K219 Gastro-esophageal reflux disease without esophagitis: Secondary | ICD-10-CM | POA: Diagnosis not present

## 2018-12-14 DIAGNOSIS — I10 Essential (primary) hypertension: Secondary | ICD-10-CM | POA: Diagnosis not present

## 2018-12-14 DIAGNOSIS — R251 Tremor, unspecified: Secondary | ICD-10-CM | POA: Diagnosis not present

## 2019-02-11 ENCOUNTER — Ambulatory Visit (INDEPENDENT_AMBULATORY_CARE_PROVIDER_SITE_OTHER): Payer: Medicare Other | Admitting: Otolaryngology

## 2019-02-11 DIAGNOSIS — H6123 Impacted cerumen, bilateral: Secondary | ICD-10-CM | POA: Diagnosis not present

## 2019-03-09 ENCOUNTER — Other Ambulatory Visit: Payer: Self-pay | Admitting: Family Medicine

## 2019-03-29 ENCOUNTER — Other Ambulatory Visit: Payer: Self-pay | Admitting: Family Medicine

## 2019-04-06 DIAGNOSIS — F919 Conduct disorder, unspecified: Secondary | ICD-10-CM | POA: Diagnosis not present

## 2019-04-29 ENCOUNTER — Ambulatory Visit (INDEPENDENT_AMBULATORY_CARE_PROVIDER_SITE_OTHER): Payer: Medicare Other | Admitting: Family Medicine

## 2019-04-29 ENCOUNTER — Other Ambulatory Visit: Payer: Self-pay

## 2019-04-29 ENCOUNTER — Encounter: Payer: Self-pay | Admitting: Family Medicine

## 2019-04-29 ENCOUNTER — Encounter (INDEPENDENT_AMBULATORY_CARE_PROVIDER_SITE_OTHER): Payer: Self-pay

## 2019-04-29 VITALS — BP 108/80 | HR 96 | Temp 98.2°F | Resp 15 | Ht 66.0 in | Wt 176.0 lb

## 2019-04-29 DIAGNOSIS — E663 Overweight: Secondary | ICD-10-CM | POA: Diagnosis not present

## 2019-04-29 DIAGNOSIS — R7989 Other specified abnormal findings of blood chemistry: Secondary | ICD-10-CM | POA: Diagnosis not present

## 2019-04-29 DIAGNOSIS — Z23 Encounter for immunization: Secondary | ICD-10-CM

## 2019-04-29 DIAGNOSIS — R252 Cramp and spasm: Secondary | ICD-10-CM

## 2019-04-29 DIAGNOSIS — I10 Essential (primary) hypertension: Secondary | ICD-10-CM | POA: Diagnosis not present

## 2019-04-29 DIAGNOSIS — F29 Unspecified psychosis not due to a substance or known physiological condition: Secondary | ICD-10-CM

## 2019-04-29 DIAGNOSIS — E559 Vitamin D deficiency, unspecified: Secondary | ICD-10-CM | POA: Diagnosis not present

## 2019-04-29 DIAGNOSIS — E785 Hyperlipidemia, unspecified: Secondary | ICD-10-CM | POA: Diagnosis not present

## 2019-04-29 NOTE — Patient Instructions (Addendum)
F/U with MD in march, call if you need me before  Flu vaccine today   Fasting lipid, cmp and EGFr, magnesiun, PTH,  and CBC and TSH in September   You are referred to Dr Dorris Fetch for f/u of thyroid and calcium levels  Thanks for choosing [facility], we consider it a privelige to serve you.

## 2019-05-01 ENCOUNTER — Encounter: Payer: Self-pay | Admitting: Family Medicine

## 2019-05-01 DIAGNOSIS — E669 Obesity, unspecified: Secondary | ICD-10-CM | POA: Insufficient documentation

## 2019-05-01 DIAGNOSIS — E663 Overweight: Secondary | ICD-10-CM | POA: Insufficient documentation

## 2019-05-01 NOTE — Progress Notes (Signed)
Terry Soto     MRN: [id number]      DOB: [birth year]   HPI Mr. Hollyfield is here for follow up and re-evaluation of chronic medical conditions, medication management and review of any available recent lab and radiology data.  Preventive health is updated, specifically  Cancer screening and Immunization.   Questions or concerns regarding consultations or procedures which the PT has had in the interim are  addressed. The PT denies any adverse reactions to current medications since the last visit.  There are no new concerns.  There are no specific complaints   ROS Denies recent fever or chills. Denies sinus pressure, nasal congestion, ear pain or sore throat. Denies chest congestion, productive cough or wheezing. Denies chest pains, palpitations and leg swelling Denies abdominal pain, nausea, vomiting,diarrhea or constipation.   Denies dysuria, frequency, hesitancy or incontinence. Denies joint pain, swelling and limitation in mobility. Denies headaches, seizures, numbness, or tingling. Denies depression, anxiety or insomnia. Denies skin break down or rash.   PE  BP 108/80   Pulse 96   Temp 98.2 F (36.8 C) (Temporal)   Resp 15   Ht 5\' 6"  (1.676 m)   Wt 176 lb (79.8 kg)   SpO2 (!) 9%   BMI 28.41 kg/m   Patient alert and oriented and in no cardiopulmonary distress.  HEENT: No facial asymmetry, EOMI,   oropharynx pink and moist.  Neck supple no JVD, no mass.  Chest: Clear to auscultation bilaterally.  CVS: S1, S2 no murmurs, no S3.Regular rate.  ABD: Soft non tender.   Ext: No edema  MS: Adequate ROM spine, shoulders, hips and knees.  Skin: Intact, no ulcerations or rash noted.  Psych: Good eye contact, normal affect. Memory impaired  not anxious or depressed appearing.  CNS: CN 2-12 intact, power,  normal throughout.no focal deficits noted.   Assessment & Plan  Essential hypertension Controlled, no change in medication DASH diet and commitment to daily physical  activity for a minimum of 30 minutes discussed and encouraged, as a part of hypertension management. The importance of attaining a healthy weight is also discussed.  BP/Weight 04/29/2019 09/16/2018 09/07/2018 08/10/2018 05/06/2018 6/44/0347 12/03/5954  Systolic BP 387 564 332 951 884 166 063  Diastolic BP 80 90 84 86 80 80 82  Wt. (Lbs) 176 176.4 177 181.12 176 173 175.12  BMI 28.41 28.47 28.57 29.23 27.57 27.1 27.43       Hyperlipidemia LDL goal <100 Hyperlipidemia:Low fat diet discussed and encouraged.   Lipid Panel  Lab Results  Component Value Date   CHOL 146 09/11/2018   HDL 40 (L) 09/11/2018   LDLCALC 89 09/11/2018   TRIG 79 09/11/2018   CHOLHDL 3.7 09/11/2018   Updated lab needed at/ before next visit.     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  The importance of healthy food choices with portion control discussed, as well as eating regularly and within a 12 hour window most days. The need to choose "clean , green" food 50 to 75% of the time is discussed, as well as to make water the primary drink and set a goal of 64 ounces water daily.    Weight /BMI 04/29/2019 09/16/2018 09/07/2018  WEIGHT 176 lb 176 lb 6.4 oz 177 lb  HEIGHT 5\' 6"  5\' 6"  5\' 6"   BMI 28.41 kg/m2 28.47 kg/m2  28.57 kg/m2

## 2019-05-01 NOTE — Assessment & Plan Note (Signed)
Hyperlipidemia:Low fat diet discussed and encouraged.   Lipid Panel  Lab Results  Component Value Date   CHOL 146 09/11/2018   HDL 40 (L) 09/11/2018   LDLCALC 89 09/11/2018   TRIG 79 09/11/2018   CHOLHDL 3.7 09/11/2018     Updated lab needed at/ before next visit.  

## 2019-05-01 NOTE — Assessment & Plan Note (Signed)
  Patient re-educated about  the importance of commitment to a  minimum of 150 minutes of exercise per week as able.  The importance of healthy food choices with portion control discussed, as well as eating regularly and within a 12 hour window most days. The need to choose "clean , green" food 50 to 75% of the time is discussed, as well as to make water the primary drink and set a goal of 64 ounces water daily.    Weight /BMI 04/29/2019 09/16/2018 09/07/2018  WEIGHT 176 lb 176 lb 6.4 oz 177 lb  HEIGHT 5\' 6"  5\' 6"  5\' 6"   BMI 28.41 kg/m2 28.47 kg/m2 28.57 kg/m2

## 2019-05-01 NOTE — Assessment & Plan Note (Signed)
Controlled, no change in medication DASH diet and commitment to daily physical activity for a minimum of 30 minutes discussed and encouraged, as a part of hypertension management. The importance of attaining a healthy weight is also discussed.  BP/Weight 04/29/2019 09/16/2018 09/07/2018 08/10/2018 05/06/2018 8/65/7846 05/09/2951  Systolic BP 841 324 401 027 253 664 403  Diastolic BP 80 90 84 86 80 80 82  Wt. (Lbs) 176 176.4 177 181.12 176 173 175.12  BMI 28.41 28.47 28.57 29.23 27.57 27.1 27.43

## 2019-05-01 NOTE — Assessment & Plan Note (Signed)
Stable , managed by Psych 

## 2019-05-12 ENCOUNTER — Ambulatory Visit: Payer: Medicare Other | Admitting: Family Medicine

## 2019-05-12 ENCOUNTER — Other Ambulatory Visit: Payer: Self-pay | Admitting: Family Medicine

## 2019-05-13 ENCOUNTER — Other Ambulatory Visit: Payer: Self-pay | Admitting: Family Medicine

## 2019-05-17 ENCOUNTER — Ambulatory Visit (INDEPENDENT_AMBULATORY_CARE_PROVIDER_SITE_OTHER): Payer: Medicare Other | Admitting: "Endocrinology

## 2019-05-17 ENCOUNTER — Encounter: Payer: Self-pay | Admitting: "Endocrinology

## 2019-05-17 ENCOUNTER — Other Ambulatory Visit: Payer: Self-pay

## 2019-05-17 VITALS — BP 118/86 | HR 95 | Temp 97.6°F | Ht 66.5 in | Wt 177.4 lb

## 2019-05-17 DIAGNOSIS — E212 Other hyperparathyroidism: Secondary | ICD-10-CM

## 2019-05-17 DIAGNOSIS — E0590 Thyrotoxicosis, unspecified without thyrotoxic crisis or storm: Secondary | ICD-10-CM | POA: Diagnosis not present
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
## 2019-05-18 LAB — PTH, INTACT AND CALCIUM
Calcium: 10.7 mg/dL — ABNORMAL HIGH (ref 8.6–10.3)
PTH: 36 pg/mL (ref 14–64)

## 2019-05-18 LAB — T3, FREE: T3, Free: 4 pg/mL (ref 2.3–4.2)

## 2019-05-18 LAB — T4, FREE: Free T4: 1.2 ng/dL (ref 0.8–1.8)

## 2019-05-18 LAB — MAGNESIUM: Magnesium: 2.3 mg/dL (ref 1.5–2.5)

## 2019-05-18 LAB — PHOSPHORUS: Phosphorus: 2.9 mg/dL (ref 2.5–4.5)

## 2019-05-18 LAB — THYROGLOBULIN ANTIBODY: Thyroglobulin Ab: <1 IU/mL (ref <=1)

## 2019-05-18 LAB — TSH: TSH: 0.25 mIU/L — ABNORMAL LOW (ref 0.40–4.50)

## 2019-05-18 LAB — THYROID PEROXIDASE ANTIBODY: Thyroperoxidase Ab SerPl-aCnc: <1 IU/mL (ref <9)

## 2019-05-20 ENCOUNTER — Other Ambulatory Visit: Payer: Self-pay

## 2019-05-20 DIAGNOSIS — E212 Other hyperparathyroidism: Secondary | ICD-10-CM

## 2019-05-26 DIAGNOSIS — E212 Other hyperparathyroidism: Secondary | ICD-10-CM | POA: Diagnosis not present

## 2019-05-27 LAB — CALCIUM, URINE, 24 HOUR: Calcium, 24H Urine: 96 mg/24 h

## 2019-05-27 LAB — CREATININE, URINE, 24 HOUR: Creatinine, 24H Ur: 1.68 g/(24.h) (ref 0.50–2.15)

## 2019-05-31 ENCOUNTER — Encounter: Payer: Self-pay | Admitting: "Endocrinology

## 2019-05-31 ENCOUNTER — Other Ambulatory Visit: Payer: Self-pay

## 2019-05-31 ENCOUNTER — Ambulatory Visit (INDEPENDENT_AMBULATORY_CARE_PROVIDER_SITE_OTHER): Payer: Medicare Other | Admitting: "Endocrinology

## 2019-05-31 DIAGNOSIS — E212 Other hyperparathyroidism: Secondary | ICD-10-CM

## 2019-05-31 DIAGNOSIS — E0590 Thyrotoxicosis, unspecified without thyrotoxic crisis or storm: Secondary | ICD-10-CM | POA: Diagnosis not present
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
## 2019-06-01 ENCOUNTER — Ambulatory Visit: Payer: Medicare Other | Admitting: "Endocrinology

## 2019-06-30 ENCOUNTER — Other Ambulatory Visit: Payer: Self-pay | Admitting: Family Medicine

## 2019-08-09 ENCOUNTER — Other Ambulatory Visit: Payer: Self-pay | Admitting: Family Medicine

## 2019-08-11 ENCOUNTER — Other Ambulatory Visit: Payer: Self-pay

## 2019-08-11 MED ORDER — ALBUTEROL SULFATE HFA 108 (90 BASE) MCG/ACT IN AERS: INHALATION_SPRAY | RESPIRATORY_TRACT | 3 refills | Status: DC

## 2019-08-12 ENCOUNTER — Ambulatory Visit (INDEPENDENT_AMBULATORY_CARE_PROVIDER_SITE_OTHER): Payer: Medicare Other | Admitting: Otolaryngology

## 2019-09-10 ENCOUNTER — Ambulatory Visit: Payer: Medicare Other

## 2019-09-10 ENCOUNTER — Other Ambulatory Visit: Payer: Self-pay

## 2019-09-10 ENCOUNTER — Encounter: Payer: Self-pay | Admitting: Family Medicine

## 2019-09-10 ENCOUNTER — Ambulatory Visit (INDEPENDENT_AMBULATORY_CARE_PROVIDER_SITE_OTHER): Payer: Medicare Other | Admitting: Family Medicine

## 2019-09-10 VITALS — BP 108/80 | HR 80 | Ht 66.0 in | Wt 177.0 lb

## 2019-09-10 DIAGNOSIS — Z0000 Encounter for general adult medical examination without abnormal findings: Secondary | ICD-10-CM

## 2019-09-10 NOTE — Patient Instructions (Signed)
Terry Soto , Thank you for taking time to come for your Medicare Wellness Visit. I appreciate your ongoing commitment to your health goals. Please review the following plan we discussed and let me know if I can assist you in the future.   Please continue to practice social distancing to keep you, your family, and our community safe.  If you must go out, please wear a Mask and practice good handwashing.  Screening recommendations/referrals: Colonoscopy: up to date Recommended yearly ophthalmology/optometry visit for glaucoma screening and checkup Recommended yearly dental visit for hygiene and checkup  Vaccinations: Influenza vaccine: Up-to-date Pneumococcal vaccine: Not needed yet Tdap vaccine: Up-to-date Shingles vaccine: Check with coverage  Advanced directives: Mother is listed as healthcare power of attorney  Conditions/risks identified: Falls  Next appointment: 11/09/2019   Preventive Care 40-64 Years, Male Preventive care refers to lifestyle choices and visits with your health care provider that can promote health and wellness. What does preventive care include?  A yearly physical exam. This is also called an annual well check.  Dental exams once or twice a year.  Routine eye exams. Ask your health care provider how often you should have your eyes checked.  Personal lifestyle choices, including:  Daily care of your teeth and gums.  Regular physical activity.  Eating a healthy diet.  Avoiding tobacco and drug use.  Limiting alcohol use.  Practicing safe sex.  Taking low-dose aspirin every day starting at age 85. What happens during an annual well check? The services and screenings done by your health care provider during your annual well check will depend on your age, overall health, lifestyle risk factors, and family history of disease. Counseling  Your health care provider may ask you questions about your:  Alcohol use.  Tobacco use.  Drug  use.  Emotional well-being.  Home and relationship well-being.  Sexual activity.  Eating habits.  Work and work Astronomer. Screening  You may have the following tests or measurements:  Height, weight, and BMI.  Blood pressure.  Lipid and cholesterol levels. These may be checked every 5 years, or more frequently if you are over 7 years old.  Skin check.  Lung cancer screening. You may have this screening every year starting at age 52 if you have a 30-pack-year history of smoking and currently smoke or have quit within the past 15 years.  Fecal occult blood test (FOBT) of the stool. You may have this test every year starting at age 71.  Flexible sigmoidoscopy or colonoscopy. You may have a sigmoidoscopy every 5 years or a colonoscopy every 10 years starting at age 85.  Prostate cancer screening. Recommendations will vary depending on your family history and other risks.  Hepatitis C blood test.  Hepatitis B blood test.  Sexually transmitted disease (STD) testing.  Diabetes screening. This is done by checking your blood sugar (glucose) after you have not eaten for a while (fasting). You may have this done every 1-3 years. Discuss your test results, treatment options, and if necessary, the need for more tests with your health care provider. Vaccines  Your health care provider may recommend certain vaccines, such as:  Influenza vaccine. This is recommended every year.  Tetanus, diphtheria, and acellular pertussis (Tdap, Td) vaccine. You may need a Td booster every 10 years.  Zoster vaccine. You may need this after age 14.  Pneumococcal 13-valent conjugate (PCV13) vaccine. You may need this if you have certain conditions and have not been vaccinated.  Pneumococcal polysaccharide (PPSV23) vaccine.  You may need one or two doses if you smoke cigarettes or if you have certain conditions. Talk to your health care provider about which screenings and vaccines you need and how  often you need them. This information is not intended to replace advice given to you by your health care provider. Make sure you discuss any questions you have with your health care provider. Document Released: 09/15/2015 Document Revised: 05/08/2016 Document Reviewed: 06/20/2015 Elsevier Interactive Patient Education  2017 [place] Prevention in the Home Falls can cause injuries. They can happen to people of all ages. There are many things you can do to make your home safe and to help prevent falls. What can I do on the outside of my home?  Regularly fix the edges of walkways and driveways and fix any cracks.  Remove anything that might make you trip as you walk through a door, such as a raised step or threshold.  Trim any bushes or trees on the path to your home.  Use bright outdoor lighting.  Clear any walking paths of anything that might make someone trip, such as rocks or tools.  Regularly check to see if handrails are loose or broken. Make sure that both sides of any steps have handrails.  Any raised decks and porches should have guardrails on the edges.  Have any leaves, snow, or ice cleared regularly.  Use sand or salt on walking paths during winter.  Clean up any spills in your garage right away. This includes oil or grease spills. What can I do in the bathroom?  Use night lights.  Install grab bars by the toilet and in the tub and shower. Do not use towel bars as grab bars.  Use non-skid mats or decals in the tub or shower.  If you need to sit down in the shower, use a plastic, non-slip stool.  Keep the floor dry. Clean up any water that spills on the floor as soon as it happens.  Remove soap buildup in the tub or shower regularly.  Attach bath mats securely with double-sided non-slip rug tape.  Do not have throw rugs and other things on the floor that can make you trip. What can I do in the bedroom?  Use night lights.  Make sure that you have a  light by your bed that is easy to reach.  Do not use any sheets or blankets that are too big for your bed. They should not hang down onto the floor.  Have a firm chair that has side arms. You can use this for support while you get dressed.  Do not have throw rugs and other things on the floor that can make you trip. What can I do in the kitchen?  Clean up any spills right away.  Avoid walking on wet floors.  Keep items that you use a lot in easy-to-reach places.  If you need to reach something above you, use a strong step stool that has a grab bar.  Keep electrical cords out of the way.  Do not use floor polish or wax that makes floors slippery. If you must use wax, use non-skid floor wax.  Do not have throw rugs and other things on the floor that can make you trip. What can I do with my stairs?  Do not leave any items on the stairs.  Make sure that there are handrails on both sides of the stairs and use them. Fix handrails that are broken or loose.  Make sure that handrails are as long as the stairways.  Check any carpeting to make sure that it is firmly attached to the stairs. Fix any carpet that is loose or worn.  Avoid having throw rugs at the top or bottom of the stairs. If you do have throw rugs, attach them to the floor with carpet tape.  Make sure that you have a light switch at the top of the stairs and the bottom of the stairs. If you do not have them, ask someone to add them for you. What else can I do to help prevent falls?  Wear shoes that:  Do not have high heels.  Have rubber bottoms.  Are comfortable and fit you well.  Are closed at the toe. Do not wear sandals.  If you use a stepladder:  Make sure that it is fully opened. Do not climb a closed stepladder.  Make sure that both sides of the stepladder are locked into place.  Ask someone to hold it for you, if possible.  Clearly [place] and make sure that you can see:  Any grab bars or  handrails.  First and last steps.  Where the edge of each step is.  Use tools that help you move around (mobility aids) if they are needed. These include:  Canes.  Walkers.  Scooters.  Crutches.  Turn on the lights when you go into a dark area. Replace any light bulbs as soon as they burn out.  Set up your furniture so you have a clear path. Avoid moving your furniture around.  If any of your floors are uneven, fix them.  If there are any pets around you, be aware of where they are.  Review your medicines with your doctor. Some medicines can make you feel dizzy. This can increase your chance of falling. Ask your doctor what other things that you can do to help prevent falls. This information is not intended to replace advice given to you by your health care provider. Make sure you discuss any questions you have with your health care provider. Document Released: 06/15/2009 Document Revised: 01/25/2016 Document Reviewed: 09/23/2014 Elsevier Interactive Patient Education  2017 [employer].
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
## 2019-11-04 ENCOUNTER — Ambulatory Visit: Payer: Medicare Other

## 2019-11-09 ENCOUNTER — Ambulatory Visit: Payer: Medicare Other | Admitting: Family Medicine

## 2019-11-19 ENCOUNTER — Ambulatory Visit: Payer: Medicare Other | Attending: Internal Medicine

## 2019-11-19 DIAGNOSIS — Z23 Encounter for immunization: Secondary | ICD-10-CM

## 2019-11-19 NOTE — Progress Notes (Signed)
   Covid-19 Vaccination Clinic  Name:  Terry Soto    MRN: [id number] DOB: [birth year]  11/19/2019  Terry Soto was observed post Covid-19 immunization for 15 minutes without incident. He was provided with Vaccine Information Sheet and instruction to access the V-Safe system.   Terry Soto was instructed to call 911 with any severe reactions post vaccine: Marland Kitchen Difficulty breathing  . Swelling of face and throat  . A fast heartbeat  . A bad rash all over body  . Dizziness and weakness   Immunizations Administered    Name Date Dose VIS Date Route   Moderna COVID-19 Vaccine 11/19/2019 11:48 AM 0.5 mL 08/03/2019 Intramuscular   Manufacturer: Moderna   Lot: [id number]   NDC: 48546-270-35

## 2019-11-22 ENCOUNTER — Ambulatory Visit: Payer: Medicare Other | Admitting: Family Medicine

## 2019-11-30 ENCOUNTER — Ambulatory Visit: Payer: Medicare Other | Admitting: "Endocrinology

## 2019-12-08 ENCOUNTER — Other Ambulatory Visit: Payer: Self-pay

## 2019-12-08 ENCOUNTER — Telehealth: Payer: Self-pay | Admitting: "Endocrinology

## 2019-12-08 DIAGNOSIS — E0590 Thyrotoxicosis, unspecified without thyrotoxic crisis or storm: Secondary | ICD-10-CM

## 2019-12-08 DIAGNOSIS — E212 Other hyperparathyroidism: Secondary | ICD-10-CM

## 2019-12-08 NOTE — Telephone Encounter (Signed)
Lab orders updated and sent to [facility]. 

## 2019-12-08 NOTE — Telephone Encounter (Signed)
Can you update lab order °

## 2019-12-13 DIAGNOSIS — E0590 Thyrotoxicosis, unspecified without thyrotoxic crisis or storm: Secondary | ICD-10-CM | POA: Diagnosis not present

## 2019-12-13 DIAGNOSIS — E212 Other hyperparathyroidism: Secondary | ICD-10-CM | POA: Diagnosis not present

## 2019-12-14 DIAGNOSIS — H6123 Impacted cerumen, bilateral: Secondary | ICD-10-CM | POA: Diagnosis not present

## 2019-12-14 LAB — PTH, INTACT AND CALCIUM
Calcium: 10.7 mg/dL — ABNORMAL HIGH (ref 8.6–10.3)
PTH: 43 pg/mL (ref 14–64)

## 2019-12-14 LAB — T4, FREE: Free T4: 1.2 ng/dL (ref 0.8–1.8)

## 2019-12-14 LAB — VITAMIN D 25 HYDROXY (VIT D DEFICIENCY, FRACTURES): Vit D, 25-Hydroxy: 29 ng/mL — ABNORMAL LOW (ref 30–100)

## 2019-12-14 LAB — TSH: TSH: 0.41 mIU/L (ref 0.40–4.50)

## 2019-12-15 ENCOUNTER — Ambulatory Visit (INDEPENDENT_AMBULATORY_CARE_PROVIDER_SITE_OTHER): Payer: Medicare Other | Admitting: "Endocrinology

## 2019-12-15 ENCOUNTER — Other Ambulatory Visit: Payer: Self-pay

## 2019-12-15 ENCOUNTER — Encounter: Payer: Self-pay | Admitting: "Endocrinology

## 2019-12-15 VITALS — BP 122/86 | HR 92 | Ht 66.5 in | Wt 170.4 lb

## 2019-12-15 DIAGNOSIS — E559 Vitamin D deficiency, unspecified: Secondary | ICD-10-CM

## 2019-12-15 DIAGNOSIS — E212 Other hyperparathyroidism: Secondary | ICD-10-CM | POA: Diagnosis not present

## 2019-12-15 DIAGNOSIS — E0590 Thyrotoxicosis, unspecified without thyrotoxic crisis or storm: Secondary | ICD-10-CM | POA: Diagnosis not present
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
## 2019-12-22 ENCOUNTER — Ambulatory Visit: Payer: Medicare Other | Attending: Internal Medicine

## 2019-12-22 DIAGNOSIS — Z23 Encounter for immunization: Secondary | ICD-10-CM

## 2019-12-22 NOTE — Progress Notes (Signed)
   Covid-19 Vaccination Clinic  Name:  Terry Soto    MRN: [id number] DOB: 05/20/1966  12/22/2019  [name] was observed post Covid-19 immunization for 15 minutes without incident. He was provided with Vaccine Information Sheet and instruction to access the V-Safe system.   [name] was instructed to call 911 with any severe reactions post vaccine: Marland Kitchen Difficulty breathing  . Swelling of face and throat  . A fast heartbeat  . A bad rash all over body  . Dizziness and weakness   Immunizations Administered    Name Date Dose VIS Date Route   Moderna COVID-19 Vaccine 12/22/2019 11:47 AM 0.5 mL 08/2019 Intramuscular   Manufacturer: Moderna   Lot: [id number]   NDC: 85462-703-50

## 2020-03-07 ENCOUNTER — Other Ambulatory Visit: Payer: Self-pay | Admitting: Family Medicine

## 2020-03-08 ENCOUNTER — Ambulatory Visit (INDEPENDENT_AMBULATORY_CARE_PROVIDER_SITE_OTHER): Payer: Medicare Other | Admitting: Family Medicine

## 2020-03-08 ENCOUNTER — Other Ambulatory Visit: Payer: Self-pay

## 2020-03-08 ENCOUNTER — Encounter: Payer: Self-pay | Admitting: Family Medicine

## 2020-03-08 VITALS — BP 118/79 | HR 84 | Temp 97.7°F | Resp 16 | Ht 66.5 in | Wt 168.0 lb

## 2020-03-08 DIAGNOSIS — Z125 Encounter for screening for malignant neoplasm of prostate: Secondary | ICD-10-CM | POA: Diagnosis not present

## 2020-03-08 DIAGNOSIS — F29 Unspecified psychosis not due to a substance or known physiological condition: Secondary | ICD-10-CM

## 2020-03-08 DIAGNOSIS — E785 Hyperlipidemia, unspecified: Secondary | ICD-10-CM

## 2020-03-08 DIAGNOSIS — I10 Essential (primary) hypertension: Secondary | ICD-10-CM

## 2020-03-08 DIAGNOSIS — E663 Overweight: Secondary | ICD-10-CM

## 2020-03-08 MED ORDER — ALBUTEROL SULFATE HFA 108 (90 BASE) MCG/ACT IN AERS: INHALATION_SPRAY | RESPIRATORY_TRACT | 3 refills | Status: DC

## 2020-03-08 NOTE — Assessment & Plan Note (Signed)
Controlled, no change in medication DASH diet and commitment to daily physical activity for a minimum of 30 minutes discussed and encouraged, as a part of hypertension management. The importance of attaining a healthy weight is also discussed.  BP/Weight 03/08/2020 12/15/2019 09/10/2019 05/17/2019 04/29/2019 09/16/2018 09/07/2018  Systolic BP 118 122 108 118 108 132 129  Diastolic BP 79 86 80 86 80 90 84  Wt. (Lbs) 168 170.4 177 177.4 176 176.4 177  BMI 26.71 27.09 28.57 28.2 28.41 28.47 28.57

## 2020-03-08 NOTE — Assessment & Plan Note (Signed)
Hyperlipidemia:Low fat diet discussed and encouraged.   Lipid Panel  Lab Results  Component Value Date   CHOL 146 09/11/2018   HDL 40 (L) 09/11/2018   LDLCALC 89 09/11/2018   TRIG 79 09/11/2018   CHOLHDL 3.7 09/11/2018     Updated lab needed at/ before next visit.

## 2020-03-08 NOTE — Patient Instructions (Addendum)
Fasting lipid, cmp and EGFR , PSA and cBc as soon as possible  F/U with MD in Feb 20222, call if you need me before.  Congrats on weight loss, and good blood pressure  Need TDaP vaccine, this is past due, [facility] and get if able  It is important that you exercise regularly at least 30 minutes 5 times a week. If you develop chest pain, have severe difficulty breathing, or feel very tired, stop exercising immediately and seek medical attention  ,Thanks for choosing [facility], we consider it a privelige to serve you.

## 2020-03-08 NOTE — Assessment & Plan Note (Signed)
  Patient re-educated about  the importance of commitment to a  minimum of 150 minutes of exercise per week as able.  The importance of healthy food choices with portion control discussed, as well as eating regularly and within a 12 hour window most days. The need to choose "clean , green" food 50 to 75% of the time is discussed, as well as to make water the primary drink and set a goal of 64 ounces water daily.    Weight /BMI 03/08/2020 12/15/2019 09/10/2019  WEIGHT 168 lb 170 lb 6.4 oz 177 lb  HEIGHT 5' 6.5" 5' 6.5" 5\' 6"   BMI 26.71 kg/m2 27.09 kg/m2 28.57 kg/m2

## 2020-03-08 NOTE — Assessment & Plan Note (Signed)
Stable and managed by Psychiatry 

## 2020-03-08 NOTE — Progress Notes (Signed)
Terry Soto     MRN: [id number]      DOB: Jun 05, 1966   HPI Terry Soto is here for follow up and re-evaluation of chronic medical conditions, medication management and review of any available recent lab and radiology data.  Preventive health is updated, specifically  Cancer screening and Immunization.   Questions or concerns regarding consultations or procedures which the PT has had in the interim are  addressed. The PT denies any adverse reactions to current medications since the last visit.  There are no new concerns.  There are no specific complaints   ROS Mother supports history Denies recent fever or chills. Denies sinus pressure, nasal congestion, ear pain or sore throat. Denies chest congestion, productive cough or wheezing. Denies chest pains, palpitations and leg swelling Denies abdominal pain, nausea, vomiting,diarrhea or constipation.   Denies dysuria, frequency, hesitancy or incontinence. Denies joint pain, swelling and limitation in mobility. Denies headaches, seizures, numbness, or tingling. Denies depression, anxiety or insomnia. Denies skin break down or rash.   PE  BP 118/79    Pulse 84    Temp 97.7 F (36.5 C)    Resp 16    Ht 5' 6.5" (1.689 m)    Wt 168 lb (76.2 kg)    SpO2 100%    BMI 26.71 kg/m   Patient alert and oriented and in no cardiopulmonary distress.  HEENT: No facial asymmetry, EOMI,     Neck supple .  Chest: Clear to auscultation bilaterally.  CVS: S1, S2 no murmurs, no S3.Regular rate.  ABD: Soft non tender.   Ext: No edema  MS: Adequate ROM spine, shoulders, hips and knees.  Skin: Intact, no ulcerations or rash noted.  Psych: Good eye contact, normal affect. Memory intact not anxious or depressed appearing.  CNS: CN 2-12 intact, power,  normal throughout.no focal deficits noted.   Assessment & Plan  Nonorganic psychosis Stable and managed by Psychiatry  Hyperlipidemia LDL goal <100 Hyperlipidemia:Low fat diet discussed and  encouraged.   Lipid Panel  Lab Results  Component Value Date   CHOL 146 09/11/2018   HDL 40 (L) 09/11/2018   LDLCALC 89 09/11/2018   TRIG 79 09/11/2018   CHOLHDL 3.7 09/11/2018     Updated lab needed at/ before next visit.   Essential hypertension Controlled, no change in medication DASH diet and commitment to daily physical activity for a minimum of 30 minutes discussed and encouraged, as a part of hypertension management. The importance of attaining a healthy weight is also discussed.  BP/Weight 03/08/2020 12/15/2019 09/10/2019 05/17/2019 04/29/2019 09/16/2018 09/07/2018  Systolic BP 118 122 108 118 108 132 129  Diastolic BP 79 86 80 86 80 90 84  Wt. (Lbs) 168 170.4 177 177.4 176 176.4 177  BMI 26.71 27.09 28.57 28.2 28.41 28.47 28.57       Overweight (BMI 25.0-29.9)  Patient re-educated about  the importance of commitment to a  minimum of 150 minutes of exercise per week as able.  The importance of healthy food choices with portion control discussed, as well as eating regularly and within a 12 hour window most days. The need to choose "clean , green" food 50 to 75% of the time is discussed, as well as to make water the primary drink and set a goal of 64 ounces water daily.    Weight /BMI 03/08/2020 12/15/2019 09/10/2019  WEIGHT 168 lb 170 lb 6.4 oz 177 lb  HEIGHT 5' 6.5" 5' 6.5" 5\' 6"   BMI 26.71 kg/m2  27.09 kg/m2 28.57 kg/m2

## 2020-03-22 DIAGNOSIS — E785 Hyperlipidemia, unspecified: Secondary | ICD-10-CM | POA: Diagnosis not present

## 2020-03-22 DIAGNOSIS — I10 Essential (primary) hypertension: Secondary | ICD-10-CM | POA: Diagnosis not present

## 2020-03-22 DIAGNOSIS — Z125 Encounter for screening for malignant neoplasm of prostate: Secondary | ICD-10-CM | POA: Diagnosis not present

## 2020-03-23 LAB — COMPLETE METABOLIC PANEL WITH GFR
AG Ratio: 1.8 (calc) (ref 1.0–2.5)
ALT: 24 U/L (ref 9–46)
AST: 25 U/L (ref 10–35)
Albumin: 4.6 g/dL (ref 3.6–5.1)
Alkaline phosphatase (APISO): 76 U/L (ref 35–144)
BUN: 11 mg/dL (ref 7–25)
CO2: 28 mmol/L (ref 20–32)
Calcium: 10.7 mg/dL — ABNORMAL HIGH (ref 8.6–10.3)
Chloride: 103 mmol/L (ref 98–110)
Creat: 0.89 mg/dL (ref 0.70–1.33)
GFR, Est African American: 113 mL/min/{1.73_m2} (ref >=60)
GFR, Est Non African American: 98 mL/min/{1.73_m2} (ref >=60)
Globulin: 2.5 g/dL (calc) (ref 1.9–3.7)
Glucose, Bld: 92 mg/dL (ref 65–99)
Potassium: 4.4 mmol/L (ref 3.5–5.3)
Sodium: 140 mmol/L (ref 135–146)
Total Bilirubin: 0.5 mg/dL (ref 0.2–1.2)
Total Protein: 7.1 g/dL (ref 6.1–8.1)

## 2020-03-23 LAB — LIPID PANEL
Cholesterol: 148 mg/dL (ref <200)
HDL: 41 mg/dL (ref >=40)
LDL Cholesterol (Calc): 92 mg/dL (calc)
Non-HDL Cholesterol (Calc): 107 mg/dL (calc) (ref <130)
Total CHOL/HDL Ratio: 3.6 (calc) (ref <5.0)
Triglycerides: 68 mg/dL (ref <150)

## 2020-03-23 LAB — CBC
HCT: 42.4 % (ref 38.5–50.0)
Hemoglobin: 14.3 g/dL (ref 13.2–17.1)
MCH: 30.6 pg (ref 27.0–33.0)
MCHC: 33.7 g/dL (ref 32.0–36.0)
MCV: 90.6 fL (ref 80.0–100.0)
MPV: 10.7 fL (ref 7.5–12.5)
Platelets: 252 10*3/uL (ref 140–400)
RBC: 4.68 10*6/uL (ref 4.20–5.80)
RDW: 13 % (ref 11.0–15.0)
WBC: 6.3 10*3/uL (ref 3.8–10.8)

## 2020-03-23 LAB — PSA: PSA: 0.5 ng/mL (ref <=4.0)

## 2020-04-17 ENCOUNTER — Other Ambulatory Visit: Payer: Self-pay | Admitting: Family Medicine

## 2020-05-04 ENCOUNTER — Ambulatory Visit (INDEPENDENT_AMBULATORY_CARE_PROVIDER_SITE_OTHER): Payer: Medicare Other

## 2020-05-04 ENCOUNTER — Other Ambulatory Visit: Payer: Self-pay

## 2020-05-04 ENCOUNTER — Ambulatory Visit: Payer: Medicare Other | Admitting: Family Medicine

## 2020-05-04 DIAGNOSIS — Z23 Encounter for immunization: Secondary | ICD-10-CM | POA: Diagnosis not present

## 2020-06-13 ENCOUNTER — Other Ambulatory Visit: Payer: Self-pay | Admitting: Family Medicine

## 2020-06-14 DIAGNOSIS — H6123 Impacted cerumen, bilateral: Secondary | ICD-10-CM | POA: Diagnosis not present

## 2020-06-16 ENCOUNTER — Other Ambulatory Visit: Payer: Self-pay

## 2020-06-16 ENCOUNTER — Other Ambulatory Visit: Payer: Self-pay | Admitting: "Endocrinology

## 2020-06-16 DIAGNOSIS — E212 Other hyperparathyroidism: Secondary | ICD-10-CM | POA: Diagnosis not present

## 2020-06-16 DIAGNOSIS — E0590 Thyrotoxicosis, unspecified without thyrotoxic crisis or storm: Secondary | ICD-10-CM | POA: Diagnosis not present

## 2020-06-16 DIAGNOSIS — E559 Vitamin D deficiency, unspecified: Secondary | ICD-10-CM | POA: Diagnosis not present

## 2020-06-18 LAB — PTH, INTACT AND CALCIUM
Calcium: 11 mg/dL — ABNORMAL HIGH (ref 8.7–10.2)
PTH: 30 pg/mL (ref 15–65)

## 2020-06-18 LAB — CALCITRIOL (1,25 DI-OH VIT D): Vit D, 1,25-Dihydroxy: 63.6 pg/mL (ref 19.9–79.3)

## 2020-06-18 LAB — TSH: TSH: 0.224 u[IU]/mL — ABNORMAL LOW (ref 0.450–4.500)

## 2020-06-18 LAB — T4, FREE: Free T4: 1.25 ng/dL (ref 0.82–1.77)

## 2020-06-19 ENCOUNTER — Other Ambulatory Visit: Payer: Self-pay

## 2020-06-19 ENCOUNTER — Ambulatory Visit (INDEPENDENT_AMBULATORY_CARE_PROVIDER_SITE_OTHER): Payer: Medicare Other | Admitting: "Endocrinology

## 2020-06-19 ENCOUNTER — Encounter: Payer: Self-pay | Admitting: "Endocrinology

## 2020-06-19 VITALS — BP 136/86 | HR 92 | Ht 66.5 in | Wt 171.4 lb

## 2020-06-19 DIAGNOSIS — E212 Other hyperparathyroidism: Secondary | ICD-10-CM

## 2020-06-19 DIAGNOSIS — E559 Vitamin D deficiency, unspecified: Secondary | ICD-10-CM | POA: Diagnosis not present

## 2020-06-19 DIAGNOSIS — E0590 Thyrotoxicosis, unspecified without thyrotoxic crisis or storm: Secondary | ICD-10-CM | POA: Diagnosis not present
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
## 2020-07-12 ENCOUNTER — Other Ambulatory Visit: Payer: Self-pay | Admitting: Family Medicine

## 2020-07-14 DIAGNOSIS — Z23 Encounter for immunization: Secondary | ICD-10-CM | POA: Diagnosis not present

## 2020-08-09 DIAGNOSIS — H40013 Open angle with borderline findings, low risk, bilateral: Secondary | ICD-10-CM | POA: Diagnosis not present

## 2020-08-09 DIAGNOSIS — H04123 Dry eye syndrome of bilateral lacrimal glands: Secondary | ICD-10-CM | POA: Diagnosis not present

## 2020-09-12 ENCOUNTER — Telehealth (INDEPENDENT_AMBULATORY_CARE_PROVIDER_SITE_OTHER): Payer: Medicare Other | Admitting: Family Medicine

## 2020-09-12 ENCOUNTER — Telehealth: Payer: Self-pay

## 2020-09-12 ENCOUNTER — Encounter: Payer: Self-pay | Admitting: Family Medicine

## 2020-09-12 ENCOUNTER — Other Ambulatory Visit: Payer: Self-pay

## 2020-09-12 VITALS — Ht 66.5 in | Wt 174.0 lb

## 2020-09-12 DIAGNOSIS — Z0000 Encounter for general adult medical examination without abnormal findings: Secondary | ICD-10-CM
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
## 2020-09-12 NOTE — Telephone Encounter (Signed)
error 

## 2020-09-12 NOTE — Patient Instructions (Addendum)
Terry Soto , Thank you for taking time to come for your Medicare Wellness Visit. I appreciate your ongoing commitment to your health goals. Please review the following plan we discussed and let me know if I can assist you in the future.   Please continue to practice social distancing to keep you, your family, and our community safe.  If you must go out, please wear a Mask and practice good handwashing.  Screening recommendations/referrals: Colonoscopy: 08/09/24 Recommended yearly ophthalmology/optometry visit for glaucoma screening and checkup Recommended yearly dental visit for hygiene and checkup  Vaccinations: Influenza vaccine: Fall 2022 Pneumococcal vaccine: Not Due Tdap vaccine: 02/05/25 Shingles vaccine: Declined  Advanced directives: POA  Conditions/risks identified: None  Next appointment: 10/09/20 @ 11am  Preventive Care 40-64 Years, Male Preventive care refers to lifestyle choices and visits with your health care provider that can promote health and wellness. What does preventive care include?  A yearly physical exam. This is also called an annual well check.  Dental exams once or twice a year.  Routine eye exams. Ask your health care provider how often you should have your eyes checked.  Personal lifestyle choices, including:  Daily care of your teeth and gums.  Regular physical activity.  Eating a healthy diet.  Avoiding tobacco and drug use.  Limiting alcohol use.  Practicing safe sex.  Taking low-dose aspirin every day starting at age 72. What happens during an annual well check? The services and screenings done by your health care provider during your annual well check will depend on your age, overall health, lifestyle risk factors, and family history of disease. Counseling  Your health care provider may ask you questions about your:  Alcohol use.  Tobacco use.  Drug use.  Emotional well-being.  Home and relationship well-being.  Sexual  activity.  Eating habits.  Work and work Astronomer. Screening  You may have the following tests or measurements:  Height, weight, and BMI.  Blood pressure.  Lipid and cholesterol levels. These may be checked every 5 years, or more frequently if you are over 41 years old.  Skin check.  Lung cancer screening. You may have this screening every year starting at age 51 if you have a 30-pack-year history of smoking and currently smoke or have quit within the past 15 years.  Fecal occult blood test (FOBT) of the stool. You may have this test every year starting at age 75.  Flexible sigmoidoscopy or colonoscopy. You may have a sigmoidoscopy every 5 years or a colonoscopy every 10 years starting at age 58.  Prostate cancer screening. Recommendations will vary depending on your family history and other risks.  Hepatitis C blood test.  Hepatitis B blood test.  Sexually transmitted disease (STD) testing.  Diabetes screening. This is done by checking your blood sugar (glucose) after you have not eaten for a while (fasting). You may have this done every 1-3 years. Discuss your test results, treatment options, and if necessary, the need for more tests with your health care provider. Vaccines  Your health care provider may recommend certain vaccines, such as:  Influenza vaccine. This is recommended every year.  Tetanus, diphtheria, and acellular pertussis (Tdap, Td) vaccine. You may need a Td booster every 10 years.  Zoster vaccine. You may need this after age 59.  Pneumococcal 13-valent conjugate (PCV13) vaccine. You may need this if you have certain conditions and have not been vaccinated.  Pneumococcal polysaccharide (PPSV23) vaccine. You may need one or two doses if you smoke  cigarettes or if you have certain conditions. Talk to your health care provider about which screenings and vaccines you need and how often you need them. This information is not intended to replace advice  given to you by your health care provider. Make sure you discuss any questions you have with your health care provider. Document Released: 09/15/2015 Document Revised: 05/08/2016 Document Reviewed: 06/20/2015 Elsevier Interactive Patient Education  2017 [employer].  Fall Prevention in the Home Falls can cause injuries. They can happen to people of all ages. There are many things you can do to make your home safe and to help prevent falls. What can I do on the outside of my home?  Regularly fix the edges of walkways and driveways and fix any cracks.  Remove anything that might make you trip as you walk through a door, such as a raised step or threshold.  Trim any bushes or trees on the path to your home.  Use bright outdoor lighting.  Clear any walking paths of anything that might make someone trip, such as rocks or tools.  Regularly check to see if handrails are loose or broken. Make sure that both sides of any steps have handrails.  Any raised decks and porches should have guardrails on the edges.  Have any leaves, snow, or ice cleared regularly.  Use sand or salt on walking paths during winter.  Clean up any spills in your garage right away. This includes oil or grease spills. What can I do in the bathroom?  Use night lights.  Install grab bars by the toilet and in the tub and shower. Do not use towel bars as grab bars.  Use non-skid mats or decals in the tub or shower.  If you need to sit down in the shower, use a plastic, non-slip stool.  Keep the floor dry. Clean up any water that spills on the floor as soon as it happens.  Remove soap buildup in the tub or shower regularly.  Attach bath mats securely with double-sided non-slip rug tape.  Do not have throw rugs and other things on the floor that can make you trip. What can I do in the bedroom?  Use night lights.  Make sure that you have a light by your bed that is easy to reach.  Do not use any sheets or  blankets that are too big for your bed. They should not hang down onto the floor.  Have a firm chair that has side arms. You can use this for support while you get dressed.  Do not have throw rugs and other things on the floor that can make you trip. What can I do in the kitchen?  Clean up any spills right away.  Avoid walking on wet floors.  Keep items that you use a lot in easy-to-reach places.  If you need to reach something above you, use a strong step stool that has a grab bar.  Keep electrical cords out of the way.  Do not use floor polish or wax that makes floors slippery. If you must use wax, use non-skid floor wax.  Do not have throw rugs and other things on the floor that can make you trip. What can I do with my stairs?  Do not leave any items on the stairs.  Make sure that there are handrails on both sides of the stairs and use them. Fix handrails that are broken or loose. Make sure that handrails are as long as the stairways.  Check any carpeting to make sure that it is firmly attached to the stairs. Fix any carpet that is loose or worn.  Avoid having throw rugs at the top or bottom of the stairs. If you do have throw rugs, attach them to the floor with carpet tape.  Make sure that you have a light switch at the top of the stairs and the bottom of the stairs. If you do not have them, ask someone to add them for you. What else can I do to help prevent falls?  Wear shoes that:  Do not have high heels.  Have rubber bottoms.  Are comfortable and fit you well.  Are closed at the toe. Do not wear sandals.  If you use a stepladder:  Make sure that it is fully opened. Do not climb a closed stepladder.  Make sure that both sides of the stepladder are locked into place.  Ask someone to hold it for you, if possible.  Clearly Lexx and make sure that you can see:  Any grab bars or handrails.  First and last steps.  Where the edge of each step is.  Use tools  that help you move around (mobility aids) if they are needed. These include:  Canes.  Walkers.  Scooters.  Crutches.  Turn on the lights when you go into a dark area. Replace any light bulbs as soon as they burn out.  Set up your furniture so you have a clear path. Avoid moving your furniture around.  If any of your floors are uneven, fix them.  If there are any pets around you, be aware of where they are.  Review your medicines with your doctor. Some medicines can make you feel dizzy. This can increase your chance of falling. Ask your doctor what other things that you can do to help prevent falls. This information is not intended to replace advice given to you by your health care provider. Make sure you discuss any questions you have with your health care provider. Document Released: 06/15/2009 Document Revised: 01/25/2016 Document Reviewed: 09/23/2014 Elsevier Interactive Patient Education  2017 [employer].

## 2020-10-09 ENCOUNTER — Telehealth (INDEPENDENT_AMBULATORY_CARE_PROVIDER_SITE_OTHER): Payer: Medicare Other | Admitting: Family Medicine

## 2020-10-09 ENCOUNTER — Other Ambulatory Visit: Payer: Self-pay | Admitting: Family Medicine

## 2020-10-09 ENCOUNTER — Encounter: Payer: Self-pay | Admitting: Family Medicine

## 2020-10-09 ENCOUNTER — Other Ambulatory Visit: Payer: Self-pay

## 2020-10-09 VITALS — Ht 66.5 in | Wt 174.0 lb

## 2020-10-09 DIAGNOSIS — E785 Hyperlipidemia, unspecified: Secondary | ICD-10-CM

## 2020-10-09 DIAGNOSIS — F29 Unspecified psychosis not due to a substance or known physiological condition: Secondary | ICD-10-CM

## 2020-10-09 DIAGNOSIS — E663 Overweight: Secondary | ICD-10-CM | POA: Diagnosis not present

## 2020-10-09 DIAGNOSIS — I10 Essential (primary) hypertension: Secondary | ICD-10-CM

## 2020-10-09 DIAGNOSIS — J309 Allergic rhinitis, unspecified: Secondary | ICD-10-CM | POA: Diagnosis not present

## 2020-10-09 NOTE — Progress Notes (Addendum)
Virtual Visit via Telephone Note  I connected with Terry Soto on 10/09/20 at 11:00 AM EST by telephone and verified that I am speaking with the correct person using two identifiers.  Location: Patient: home Provider: office   I discussed the limitations, risks, security and privacy concerns of performing an evaluation and management service by telephone and the availability of in person appointments. I also discussed with the patient that there may be a patient responsible charge related to this service. The patient expressed understanding and agreed to proceed.   History of Present Illness: Mother and patient report that he is doing well and have no concerns, history primarily from Mother , Terry Soto has dysarthria F/U chronic problems and address any new or current concerns. Review and update medications and allergies. Review recent lab and radiologic data . Update routine health maintainace. Review an encourage improved health habits to include nutrition, exercise and  sleep .  Denies recent fever or chills. Denies sinus pressure, nasal congestion, ear pain or sore throat. Denies chest congestion, productive cough or wheezing. Denies chest pains, palpitations and leg swelling Denies abdominal pain, nausea, vomiting,diarrhea or constipation.   Denies dysuria, frequency, hesitancy or incontinence. Denies joint pain, swelling and limitation in mobility. Denies headaches, seizures, numbness, or tingling. Denies depression, anxiety or insomnia. Denies skin break down or rash.       Observations/Objective: Ht 5' 6.5" (1.689 m)   Wt 174 lb (78.9 kg)   BMI 27.66 kg/m  Good communication with no confusion and intact memory. Alert and oriented x 3 No signs of respiratory distress during speech    Assessment and Plan: Essential hypertension Controlled, no change in medication DASH diet and commitment to daily physical activity for a minimum of 30 minutes discussed and encouraged, as  a part of hypertension management. The importance of attaining a healthy weight is also discussed.  BP/Weight 10/09/2020 09/12/2020 06/19/2020 03/08/2020 12/15/2019 09/10/2019 05/17/2019  Systolic BP - - 136 118 122 108 118  Diastolic BP - - 86 79 86 80 86  Wt. (Lbs) 174 174 171.4 168 170.4 177 177.4  BMI 27.66 27.66 27.25 26.71 27.09 28.57 28.2       Hyperlipidemia LDL goal <100 Hyperlipidemia:Low fat diet discussed and encouraged.   Lipid Panel  Lab Results  Component Value Date   CHOL 148 03/22/2020   HDL 41 03/22/2020   LDLCALC 92 03/22/2020   TRIG 68 03/22/2020   CHOLHDL 3.6 03/22/2020     Controlled, no change in medication Updated lab needed at/ before next visit.   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  The importance of healthy food choices with portion control discussed, as well as eating regularly and within a 12 hour window most days. The need to choose "clean , green" food 50 to 75% of the time is discussed, as well as to make water the primary drink and set a goal of 64 ounces water daily.    Weight /BMI 10/09/2020 09/12/2020 06/19/2020  WEIGHT 174 lb 174 lb 171 lb 6.4 oz  HEIGHT 5' 6.5" 5' 6.5" 5' 6.5"  BMI 27.66 kg/m2 27.66 kg/m2 27.25 kg/m2      Allergic rhinitis Controlled, no change in medication     Follow Up Instructions:    I discussed the assessment and treatment plan with the patient. The patient was provided  an opportunity to ask questions and all were answered. The patient agreed with the plan and demonstrated an understanding of the instructions.   The patient was advised to call back or seek an in-person evaluation if the symptoms worsen or if the condition fails to improve as anticipated.  I provided 22 minutes of non-face-to-face time during this encounter.   Syliva Overman,  MD

## 2020-10-09 NOTE — Patient Instructions (Signed)
F/U end August , call if you need me sooner  No medication changes  Please get fasting lipid, cmp and EGFR in next 1 to 2 weeks  Please get TDAP at the pharmacy  It is important that you exercise regularly at least 30 minutes 5 times a week. If you develop chest pain, have severe difficulty breathing, or feel very tired, stop exercising immediately and seek medical attention   Think about what you will eat, plan ahead. Choose " clean, green, fresh or frozen" over canned, processed or packaged foods which are more sugary, salty and fatty. 70 to 75% of food eaten should be vegetables and fruit. Three meals at set times with snacks allowed between meals, but they must be fruit or vegetables. Aim to eat over a 12 hour period , example 7 am to 7 pm, and STOP after  your last meal of the day. Drink water,generally about 64 ounces per day, no other drink is as healthy. Fruit juice is best enjoyed in a healthy way, by EATING the fruit.  Thanks for choosing [facility], we consider it a privelige to serve you.

## 2020-10-14 ENCOUNTER — Encounter: Payer: Self-pay | Admitting: Family Medicine

## 2020-10-14 NOTE — Assessment & Plan Note (Signed)
Hyperlipidemia:Low fat diet discussed and encouraged.   Lipid Panel  Lab Results  Component Value Date   CHOL 148 03/22/2020   HDL 41 03/22/2020   LDLCALC 92 03/22/2020   TRIG 68 03/22/2020   CHOLHDL 3.6 03/22/2020     Controlled, no change in medication Updated lab needed at/ before next visit.

## 2020-10-14 NOTE — Assessment & Plan Note (Signed)
Controlled, no change in medication  

## 2020-10-14 NOTE — Addendum Note (Signed)
Addended by: Kerri Perches on: 10/14/2020 04:14 PM   Modules accepted: Level of Service

## 2020-10-14 NOTE — Assessment & Plan Note (Signed)
Stable and treated by Psych 

## 2020-10-14 NOTE — Assessment & Plan Note (Signed)
Controlled, no change in medication DASH diet and commitment to daily physical activity for a minimum of 30 minutes discussed and encouraged, as a part of hypertension management. The importance of attaining a healthy weight is also discussed.  BP/Weight 10/09/2020 09/12/2020 06/19/2020 03/08/2020 12/15/2019 09/10/2019 05/17/2019  Systolic BP - - 136 118 122 108 118  Diastolic BP - - 86 79 86 80 86  Wt. (Lbs) 174 174 171.4 168 170.4 177 177.4  BMI 27.66 27.66 27.25 26.71 27.09 28.57 28.2

## 2020-10-14 NOTE — Assessment & Plan Note (Signed)
  Patient re-educated about  the importance of commitment to a  minimum of 150 minutes of exercise per week as able.  The importance of healthy food choices with portion control discussed, as well as eating regularly and within a 12 hour window most days. The need to choose "clean , green" food 50 to 75% of the time is discussed, as well as to make water the primary drink and set a goal of 64 ounces water daily.    Weight /BMI 10/09/2020 09/12/2020 06/19/2020  WEIGHT 174 lb 174 lb 171 lb 6.4 oz  HEIGHT 5' 6.5" 5' 6.5" 5' 6.5"  BMI 27.66 kg/m2 27.66 kg/m2 27.25 kg/m2

## 2020-10-24 DIAGNOSIS — I10 Essential (primary) hypertension: Secondary | ICD-10-CM | POA: Diagnosis not present

## 2020-10-24 DIAGNOSIS — E785 Hyperlipidemia, unspecified: Secondary | ICD-10-CM | POA: Diagnosis not present

## 2020-10-25 LAB — CMP14+EGFR
ALT: 56 IU/L — ABNORMAL HIGH (ref 0–44)
AST: 34 IU/L (ref 0–40)
Albumin/Globulin Ratio: 1.8 (ref 1.2–2.2)
Albumin: 4.8 g/dL (ref 3.8–4.9)
Alkaline Phosphatase: 91 IU/L (ref 44–121)
BUN/Creatinine Ratio: 23 — ABNORMAL HIGH (ref 9–20)
BUN: 26 mg/dL — ABNORMAL HIGH (ref 6–24)
Bilirubin Total: 0.3 mg/dL (ref 0.0–1.2)
CO2: 20 mmol/L (ref 20–29)
Calcium: 10.8 mg/dL — ABNORMAL HIGH (ref 8.7–10.2)
Chloride: 102 mmol/L (ref 96–106)
Creatinine, Ser: 1.14 mg/dL (ref 0.76–1.27)
GFR calc Af Amer: 84 mL/min/{1.73_m2} (ref >59)
GFR calc non Af Amer: 73 mL/min/{1.73_m2} (ref >59)
Globulin, Total: 2.7 g/dL (ref 1.5–4.5)
Glucose: 109 mg/dL — ABNORMAL HIGH (ref 65–99)
Potassium: 4.8 mmol/L (ref 3.5–5.2)
Sodium: 138 mmol/L (ref 134–144)
Total Protein: 7.5 g/dL (ref 6.0–8.5)

## 2020-10-25 LAB — LIPID PANEL
Chol/HDL Ratio: 3.4 ratio (ref 0.0–5.0)
Cholesterol, Total: 168 mg/dL (ref 100–199)
HDL: 50 mg/dL (ref >39)
LDL Chol Calc (NIH): 105 mg/dL — ABNORMAL HIGH (ref 0–99)
Triglycerides: 66 mg/dL (ref 0–149)
VLDL Cholesterol Cal: 13 mg/dL (ref 5–40)

## 2020-10-29 ENCOUNTER — Other Ambulatory Visit: Payer: Self-pay | Admitting: Family Medicine

## 2020-10-31 DIAGNOSIS — H40033 Anatomical narrow angle, bilateral: Secondary | ICD-10-CM | POA: Diagnosis not present

## 2020-10-31 DIAGNOSIS — H40032 Anatomical narrow angle, left eye: Secondary | ICD-10-CM | POA: Diagnosis not present

## 2020-10-31 DIAGNOSIS — H402233 Chronic angle-closure glaucoma, bilateral, severe stage: Secondary | ICD-10-CM | POA: Diagnosis not present

## 2020-11-06 DIAGNOSIS — F919 Conduct disorder, unspecified: Secondary | ICD-10-CM | POA: Diagnosis not present

## 2020-11-07 DIAGNOSIS — H40031 Anatomical narrow angle, right eye: Secondary | ICD-10-CM | POA: Diagnosis not present

## 2020-11-07 DIAGNOSIS — H402230 Chronic angle-closure glaucoma, bilateral, stage unspecified: Secondary | ICD-10-CM | POA: Diagnosis not present

## 2020-11-21 DIAGNOSIS — H04123 Dry eye syndrome of bilateral lacrimal glands: Secondary | ICD-10-CM | POA: Diagnosis not present

## 2020-11-21 DIAGNOSIS — H40033 Anatomical narrow angle, bilateral: Secondary | ICD-10-CM | POA: Diagnosis not present

## 2020-11-21 DIAGNOSIS — H402231 Chronic angle-closure glaucoma, bilateral, mild stage: Secondary | ICD-10-CM | POA: Diagnosis not present

## 2020-12-12 ENCOUNTER — Other Ambulatory Visit: Payer: Self-pay

## 2020-12-12 DIAGNOSIS — E212 Other hyperparathyroidism: Secondary | ICD-10-CM

## 2020-12-18 ENCOUNTER — Ambulatory Visit: Payer: Medicare Other | Admitting: "Endocrinology

## 2020-12-18 DIAGNOSIS — H6123 Impacted cerumen, bilateral: Secondary | ICD-10-CM | POA: Diagnosis not present

## 2020-12-20 DIAGNOSIS — E0590 Thyrotoxicosis, unspecified without thyrotoxic crisis or storm: Secondary | ICD-10-CM | POA: Diagnosis not present

## 2020-12-20 DIAGNOSIS — E212 Other hyperparathyroidism: Secondary | ICD-10-CM | POA: Diagnosis not present

## 2020-12-21 DIAGNOSIS — H40033 Anatomical narrow angle, bilateral: Secondary | ICD-10-CM | POA: Diagnosis not present

## 2020-12-21 DIAGNOSIS — H402231 Chronic angle-closure glaucoma, bilateral, mild stage: Secondary | ICD-10-CM | POA: Diagnosis not present

## 2020-12-21 LAB — T4, FREE: Free T4: 1.36 ng/dL (ref 0.82–1.77)

## 2020-12-21 LAB — CALCIUM, URINE, 24 HOUR
Calcium, 24H Urine: 103 mg/24 hr (ref 0–320)
Calcium, Urine: 4.1 mg/dL

## 2020-12-21 LAB — PTH, INTACT AND CALCIUM
Calcium: 10.6 mg/dL — ABNORMAL HIGH (ref 8.7–10.2)
PTH: 44 pg/mL (ref 15–65)

## 2020-12-21 LAB — CREATININE, URINE, 24 HOUR
Creatinine, 24H Ur: 1358 mg/24 hr (ref 1000–2000)
Creatinine, Urine: 54.3 mg/dL

## 2020-12-21 LAB — PHOSPHORUS: Phosphorus: 3.5 mg/dL (ref 2.8–4.1)

## 2020-12-21 LAB — MAGNESIUM: Magnesium: 2.4 mg/dL — ABNORMAL HIGH (ref 1.6–2.3)

## 2020-12-21 LAB — TSH: TSH: 0.274 u[IU]/mL — ABNORMAL LOW (ref 0.450–4.500)

## 2021-01-01 ENCOUNTER — Ambulatory Visit (INDEPENDENT_AMBULATORY_CARE_PROVIDER_SITE_OTHER): Payer: Medicare Other | Admitting: "Endocrinology

## 2021-01-01 ENCOUNTER — Other Ambulatory Visit: Payer: Self-pay

## 2021-01-01 ENCOUNTER — Encounter: Payer: Self-pay | Admitting: "Endocrinology

## 2021-01-01 VITALS — BP 126/85 | HR 96 | Ht 66.5 in | Wt 178.8 lb

## 2021-01-01 DIAGNOSIS — E212 Other hyperparathyroidism: Secondary | ICD-10-CM

## 2021-01-01 DIAGNOSIS — E0590 Thyrotoxicosis, unspecified without thyrotoxic crisis or storm: Secondary | ICD-10-CM | POA: Diagnosis not present

## 2021-01-01 DIAGNOSIS — E559 Vitamin D deficiency, unspecified: Secondary | ICD-10-CM
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
## 2021-01-24 DIAGNOSIS — H40033 Anatomical narrow angle, bilateral: Secondary | ICD-10-CM | POA: Diagnosis not present

## 2021-01-24 DIAGNOSIS — H402231 Chronic angle-closure glaucoma, bilateral, mild stage: Secondary | ICD-10-CM | POA: Diagnosis not present

## 2021-02-06 ENCOUNTER — Other Ambulatory Visit: Payer: Self-pay | Admitting: Family Medicine

## 2021-04-05 DIAGNOSIS — E212 Other hyperparathyroidism: Secondary | ICD-10-CM | POA: Diagnosis not present

## 2021-04-05 DIAGNOSIS — E0590 Thyrotoxicosis, unspecified without thyrotoxic crisis or storm: Secondary | ICD-10-CM | POA: Diagnosis not present

## 2021-04-09 ENCOUNTER — Ambulatory Visit: Payer: Medicare Other | Admitting: Family Medicine

## 2021-04-09 LAB — TSH: TSH: 0.319 u[IU]/mL — ABNORMAL LOW (ref 0.450–4.500)

## 2021-04-09 LAB — PTH, INTACT AND CALCIUM
Calcium: 10.6 mg/dL — ABNORMAL HIGH (ref 8.7–10.2)
PTH: 51 pg/mL (ref 15–65)

## 2021-04-09 LAB — T4, FREE: Free T4: 1.31 ng/dL (ref 0.82–1.77)

## 2021-04-17 ENCOUNTER — Other Ambulatory Visit: Payer: Self-pay

## 2021-04-17 ENCOUNTER — Encounter: Payer: Self-pay | Admitting: Family Medicine

## 2021-04-17 ENCOUNTER — Ambulatory Visit (INDEPENDENT_AMBULATORY_CARE_PROVIDER_SITE_OTHER): Payer: Medicare Other | Admitting: Family Medicine

## 2021-04-17 VITALS — BP 127/85 | HR 100 | Temp 99.2°F | Resp 20 | Ht 67.0 in | Wt 185.0 lb

## 2021-04-17 DIAGNOSIS — I10 Essential (primary) hypertension: Secondary | ICD-10-CM

## 2021-04-17 DIAGNOSIS — F29 Unspecified psychosis not due to a substance or known physiological condition: Secondary | ICD-10-CM

## 2021-04-17 DIAGNOSIS — R7301 Impaired fasting glucose: Secondary | ICD-10-CM

## 2021-04-17 DIAGNOSIS — E785 Hyperlipidemia, unspecified: Secondary | ICD-10-CM

## 2021-04-17 DIAGNOSIS — R062 Wheezing: Secondary | ICD-10-CM | POA: Insufficient documentation

## 2021-04-17 DIAGNOSIS — E663 Overweight: Secondary | ICD-10-CM | POA: Diagnosis not present

## 2021-04-17 DIAGNOSIS — E559 Vitamin D deficiency, unspecified: Secondary | ICD-10-CM

## 2021-04-17 DIAGNOSIS — E0590 Thyrotoxicosis, unspecified without thyrotoxic crisis or storm: Secondary | ICD-10-CM

## 2021-04-17 DIAGNOSIS — Z125 Encounter for screening for malignant neoplasm of prostate: Secondary | ICD-10-CM

## 2021-04-17 MED ORDER — BUDESONIDE-FORMOTEROL FUMARATE 80-4.5 MCG/ACT IN AERO: 2.0000 | INHALATION_SPRAY | Freq: Two times a day (BID) | RESPIRATORY_TRACT | 6 refills | Status: DC

## 2021-04-17 NOTE — Assessment & Plan Note (Signed)
  Patient re-educated about  the importance of commitment to a  minimum of 150 minutes of exercise per week as able.  The importance of healthy food choices with portion control discussed, as well as eating regularly and within a 12 hour window most days. The need to choose "clean , green" food 50 to 75% of the time is discussed, as well as to make water the primary drink and set a goal of 64 ounces water daily.    Weight /BMI 04/17/2021 01/01/2021 10/09/2020  WEIGHT 185 lb 178 lb 12.8 oz 174 lb  HEIGHT 5\' 7"  5' 6.5" 5' 6.5"  BMI 28.98 kg/m2 28.43 kg/m2 27.66 kg/m2  Deteriorated

## 2021-04-17 NOTE — Assessment & Plan Note (Signed)
Managed by Endo 

## 2021-04-17 NOTE — Patient Instructions (Addendum)
F/U in 6 months, cal if you need me sooner  Come in September for your flu vaccine the same day your Mom has her appt  Please get fasting lipid, cmp and EGFR, CBC, PSA, vit D and HBA1C in the next 6 weeks  New extra medication for wheezing, symbicort, use every day, and you should not need the rescue ihaler as much  Need covid booster, and please bring card when you next come  It is important that you exercise regularly at least 30 minutes 5 times a week. If you develop chest pain, have severe difficulty breathing, or feel very tired, stop exercising immediately and seek medical attention    Thanks for choosing [facility], we consider it a privelige to serve you.

## 2021-04-17 NOTE — Assessment & Plan Note (Signed)
Reports wheezing and using albutero 3 times weekly, add symbicort  daily

## 2021-04-17 NOTE — Progress Notes (Signed)
Terry Soto     MRN: [id number]      DOB: 03/17/1966   HPI Terry Soto is here for follow up and re-evaluation of chronic medical conditions, medication management and review of any available recent lab and radiology data.  Preventive health is updated, specifically  Cancer screening and Immunization.   Questions or concerns regarding consultations or procedures which the PT has had in the interim are  addressed. The PT denies any adverse reactions to current medications since the last visit.  Uses albuterol twice weekly  ROS Denies recent fever or chills. Denies sinus pressure, nasal congestion, ear pain or sore throat. Denies chest congestion, productive cough or wheezing. Denies chest pains, palpitations and leg swelling Denies abdominal pain, nausea, vomiting,diarrhea or constipation.   Denies dysuria, frequency, hesitancy or incontinence. Denies joint pain, swelling and limitation in mobility. Denies headaches, seizures, numbness, or tingling. Denies depression, anxiety or insomnia. Denies skin break down or rash.   PE  BP 127/85 (BP Location: Right Arm, Patient Position: Sitting, Cuff Size: Large)   Pulse 100   Temp 99.2 F (37.3 C)   Resp 20   Ht 5\' 7"  (1.702 m)   Wt 185 lb (83.9 kg)   SpO2 95%   BMI 28.98 kg/m   Patient alert and oriented and in no cardiopulmonary distress.  HEENT: No facial asymmetry, EOMI,     Neck supple .  Chest: Clear to auscultation bilaterally.  CVS: S1, S2 no murmurs, no S3.Regular rate.  ABD: Soft non tender.   Ext: No edema  MS: Adequate ROM spine, shoulders, hips and knees.  Skin: Intact, no ulcerations or rash noted.  Psych: Good eye contact, normal affect.  not anxious or depressed appearing.  CNS: CN 2-12 intact, power,  normal throughout.no focal deficits noted.   Assessment & Plan  Essential hypertension .conm1 DASH diet and commitment to daily physical activity for a minimum of 30 minutes discussed and encouraged, as  a part of hypertension management. The importance of attaining a healthy weight is also discussed.  BP/Weight 04/17/2021 01/01/2021 10/09/2020 09/12/2020 06/19/2020 03/08/2020 12/15/2019  Systolic BP 127 126 - - 136 118 122  Diastolic BP 85 85 - - 86 79 86  Wt. (Lbs) 185 178.8 174 174 171.4 168 170.4  BMI 28.98 28.43 27.66 27.66 27.25 26.71 27.09       Hyperlipidemia LDL goal <100 Hyperlipidemia:Low fat diet discussed and encouraged.   Lipid Panel  Lab Results  Component Value Date   CHOL 168 10/24/2020   HDL 50 10/24/2020   LDLCALC 105 (H) 10/24/2020   TRIG 66 10/24/2020   CHOLHDL 3.4 10/24/2020     Needs to reduce fried and fatty foods Updated lab needed at/ before next visit.   Overweight (BMI 25.0-29.9)  Patient re-educated about  the importance of commitment to a  minimum of 150 minutes of exercise per week as able.  The importance of healthy food choices with portion control discussed, as well as eating regularly and within a 12 hour window most days. The need to choose "clean , green" food 50 to 75% of the time is discussed, as well as to make water the primary drink and set a goal of 64 ounces water daily.    Weight /BMI 04/17/2021 01/01/2021 10/09/2020  WEIGHT 185 lb 178 lb 12.8 oz 174 lb  HEIGHT 5\' 7"  5' 6.5" 5' 6.5"  BMI 28.98 kg/m2 28.43 kg/m2 27.66 kg/m2  Deteriorated    Nonorganic psychosis Controlled, no change  in medication Managed by Psych  Hypercalcemia Managed by Endo  Wheezing Reports wheezing and using albutero 3 times weekly, add symbicort  daily

## 2021-04-17 NOTE — Assessment & Plan Note (Signed)
.  conm1 DASH diet and commitment to daily physical activity for a minimum of 30 minutes discussed and encouraged, as a part of hypertension management. The importance of attaining a healthy weight is also discussed.  BP/Weight 04/17/2021 01/01/2021 10/09/2020 09/12/2020 06/19/2020 03/08/2020 12/15/2019  Systolic BP 127 126 - - 136 118 122  Diastolic BP 85 85 - - 86 79 86  Wt. (Lbs) 185 178.8 174 174 171.4 168 170.4  BMI 28.98 28.43 27.66 27.66 27.25 26.71 27.09

## 2021-04-17 NOTE — Assessment & Plan Note (Signed)
Hyperlipidemia:Low fat diet discussed and encouraged.   Lipid Panel  Lab Results  Component Value Date   CHOL 168 10/24/2020   HDL 50 10/24/2020   LDLCALC 105 (H) 10/24/2020   TRIG 66 10/24/2020   CHOLHDL 3.4 10/24/2020     Needs to reduce fried and fatty foods Updated lab needed at/ before next visit.

## 2021-04-17 NOTE — Assessment & Plan Note (Signed)
Controlled, no change in medication Managed by Psych 

## 2021-05-18 ENCOUNTER — Other Ambulatory Visit: Payer: Self-pay

## 2021-05-18 ENCOUNTER — Ambulatory Visit (INDEPENDENT_AMBULATORY_CARE_PROVIDER_SITE_OTHER): Payer: Medicare Other

## 2021-05-18 DIAGNOSIS — Z23 Encounter for immunization: Secondary | ICD-10-CM | POA: Diagnosis not present

## 2021-05-22 ENCOUNTER — Other Ambulatory Visit: Payer: Self-pay | Admitting: Family Medicine

## 2021-06-13 ENCOUNTER — Other Ambulatory Visit: Payer: Self-pay | Admitting: Family Medicine

## 2021-07-04 ENCOUNTER — Ambulatory Visit: Payer: Medicare Other | Admitting: "Endocrinology

## 2021-07-12 ENCOUNTER — Other Ambulatory Visit: Payer: Self-pay | Admitting: Family Medicine

## 2021-08-29 DIAGNOSIS — F919 Conduct disorder, unspecified: Secondary | ICD-10-CM | POA: Diagnosis not present

## 2021-09-20 ENCOUNTER — Ambulatory Visit (INDEPENDENT_AMBULATORY_CARE_PROVIDER_SITE_OTHER): Payer: Medicare Other

## 2021-09-20 ENCOUNTER — Encounter (INDEPENDENT_AMBULATORY_CARE_PROVIDER_SITE_OTHER): Payer: Self-pay

## 2021-09-20 ENCOUNTER — Other Ambulatory Visit: Payer: Self-pay

## 2021-09-20 DIAGNOSIS — Z0000 Encounter for general adult medical examination without abnormal findings: Secondary | ICD-10-CM | POA: Diagnosis not present

## 2021-09-20 NOTE — Patient Instructions (Addendum)
Terry Soto , Thank you for taking time to come for your Medicare Wellness Visit. I appreciate your ongoing commitment to your health goals. Please review the following plan we discussed and let me know if I can assist you in the future.   These are the goals we discussed:  Goals      DIET - DECREASE SODA OR JUICE INTAKE     Exercise 3x per week (30 min per time)     Starting 09/02/2016 patient would like to start exercising 3 times a week for 30 minutes at at time.        This is a list of the screening recommended for you and due dates:  Health Maintenance  Topic Date Due   Zoster (Shingles) Vaccine (1 of 2) Never done   Cologuard (Stool DNA test)  06/10/2021   COVID-19 Vaccine (5 - Booster for Moderna series) 06/14/2021   Tetanus Vaccine  10/10/2030   Flu Shot  Completed   Hepatitis C Screening: USPSTF Recommendation to screen - Ages 18-79 yo.  Completed   HIV Screening  Completed   HPV Vaccine  Aged Out   Colon Cancer Screening  Discontinued    Health Maintenance, Male Adopting a healthy lifestyle and getting preventive care are important in promoting health and wellness. Ask your health care provider about: The right schedule for you to have regular tests and exams. Things you can do on your own to prevent diseases and keep yourself healthy. What should I know about diet, weight, and exercise? Eat a healthy diet  Eat a diet that includes plenty of vegetables, fruits, low-fat dairy products, and lean protein. Do not eat a lot of foods that are high in solid fats, added sugars, or sodium. Maintain a healthy weight Body mass index (BMI) is a measurement that can be used to identify possible weight problems. It estimates body fat based on height and weight. Your health care provider can help determine your BMI and help you achieve or maintain a healthy weight. Get regular exercise Get regular exercise. This is one of the most important things you can do for your health. Most  adults should: Exercise for at least 150 minutes each week. The exercise should increase your heart rate and make you sweat (moderate-intensity exercise). Do strengthening exercises at least twice a week. This is in addition to the moderate-intensity exercise. Spend less time sitting. Even light physical activity can be beneficial. Watch cholesterol and blood lipids Have your blood tested for lipids and cholesterol at 55 years of age, then have this test every 5 years. You may need to have your cholesterol levels checked more often if: Your lipid or cholesterol levels are high. You are older than 55 years of age. You are at high risk for heart disease. What should I know about cancer screening? Many types of cancers can be detected early and may often be prevented. Depending on your health history and family history, you may need to have cancer screening at various ages. This may include screening for: Colorectal cancer. Prostate cancer. Skin cancer. Lung cancer. What should I know about heart disease, diabetes, and high blood pressure? Blood pressure and heart disease High blood pressure causes heart disease and increases the risk of stroke. This is more likely to develop in people who have high blood pressure readings or are overweight. Talk with your health care provider about your target blood pressure readings. Have your blood pressure checked: Every 3-5 years if you are 18-39  years of age. Every year if you are 82 years old or older. If you are between the ages of 34 and 29 and are a current or former smoker, ask your health care provider if you should have a one-time screening for abdominal aortic aneurysm (AAA). Diabetes Have regular diabetes screenings. This checks your fasting blood sugar level. Have the screening done: Once every three years after age 80 if you are at a normal weight and have a low risk for diabetes. More often and at a younger age if you are overweight or have  a high risk for diabetes. What should I know about preventing infection? Hepatitis B If you have a higher risk for hepatitis B, you should be screened for this virus. Talk with your health care provider to find out if you are at risk for hepatitis B infection. Hepatitis C Blood testing is recommended for: Everyone born from [birth year] through 1964. Anyone with known risk factors for hepatitis C. Sexually transmitted infections (STIs) You should be screened each year for STIs, including gonorrhea and chlamydia, if: You are sexually active and are younger than 55 years of age. You are older than 55 years of age and your health care provider tells you that you are at risk for this type of infection. Your sexual activity has changed since you were last screened, and you are at increased risk for chlamydia or gonorrhea. Ask your health care provider if you are at risk. Ask your health care provider about whether you are at high risk for HIV. Your health care provider may recommend a prescription medicine to help prevent HIV infection. If you choose to take medicine to prevent HIV, you should first get tested for HIV. You should then be tested every 3 months for as long as you are taking the medicine. Follow these instructions at home: Alcohol use Do not drink alcohol if your health care provider tells you not to drink. If you drink alcohol: Limit how much you have to 0-2 drinks a day. Know how much alcohol is in your drink. In the [place] one drink equals one 12 oz bottle of beer (355 mL), one 5 oz glass of wine (148 mL), or one 1 oz glass of hard liquor (44 mL). Lifestyle Do not use any products that contain nicotine or tobacco. These products include cigarettes, chewing tobacco, and vaping devices, such as e-cigarettes. If you need help quitting, ask your health care provider. Do not use street drugs. Do not share needles. Ask your health care provider for help if you need support or information about  quitting drugs. General instructions Schedule regular health, dental, and eye exams. Stay current with your vaccines. Tell your health care provider if: You often feel depressed. You have ever been abused or do not feel safe at home. Summary Adopting a healthy lifestyle and getting preventive care are important in promoting health and wellness. Follow your health care provider's instructions about healthy diet, exercising, and getting tested or screened for diseases. Follow your health care provider's instructions on monitoring your cholesterol and blood pressure. This information is not intended to replace advice given to you by your health care provider. Make sure you discuss any questions you have with your health care provider. Document Revised: 01/08/2021 Document Reviewed: 01/08/2021 Elsevier Patient Education  [place].
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
## 2021-10-18 ENCOUNTER — Other Ambulatory Visit: Payer: Self-pay

## 2021-10-18 ENCOUNTER — Encounter: Payer: Self-pay | Admitting: Family Medicine

## 2021-10-18 ENCOUNTER — Ambulatory Visit (INDEPENDENT_AMBULATORY_CARE_PROVIDER_SITE_OTHER): Payer: Medicare Other | Admitting: Family Medicine

## 2021-10-18 ENCOUNTER — Telehealth: Payer: Self-pay | Admitting: "Endocrinology

## 2021-10-18 VITALS — BP 132/81 | HR 97 | Ht 66.0 in | Wt 189.0 lb

## 2021-10-18 DIAGNOSIS — E212 Other hyperparathyroidism: Secondary | ICD-10-CM | POA: Diagnosis not present

## 2021-10-18 DIAGNOSIS — Z125 Encounter for screening for malignant neoplasm of prostate: Secondary | ICD-10-CM

## 2021-10-18 DIAGNOSIS — M519 Unspecified thoracic, thoracolumbar and lumbosacral intervertebral disc disorder: Secondary | ICD-10-CM

## 2021-10-18 DIAGNOSIS — R7301 Impaired fasting glucose: Secondary | ICD-10-CM | POA: Diagnosis not present

## 2021-10-18 DIAGNOSIS — E559 Vitamin D deficiency, unspecified: Secondary | ICD-10-CM

## 2021-10-18 DIAGNOSIS — E785 Hyperlipidemia, unspecified: Secondary | ICD-10-CM | POA: Diagnosis not present

## 2021-10-18 DIAGNOSIS — Z1211 Encounter for screening for malignant neoplasm of colon: Secondary | ICD-10-CM

## 2021-10-18 DIAGNOSIS — I10 Essential (primary) hypertension: Secondary | ICD-10-CM

## 2021-10-18 DIAGNOSIS — E669 Obesity, unspecified: Secondary | ICD-10-CM

## 2021-10-18 DIAGNOSIS — F29 Unspecified psychosis not due to a substance or known physiological condition: Secondary | ICD-10-CM | POA: Diagnosis not present

## 2021-10-18 DIAGNOSIS — E66811 Obesity, class 1: Secondary | ICD-10-CM

## 2021-10-18 NOTE — Telephone Encounter (Signed)
Called pt to sch a follow up with labs of his thyroid, per Dr Moshe Cipro. No answer. If patient calls back, ask him to get his labs done and make follow up

## 2021-10-18 NOTE — Assessment & Plan Note (Signed)
Needs endo appt, labs ordered  From record review

## 2021-10-18 NOTE — Patient Instructions (Addendum)
F/U in 6 months, flu vaccine a visit, call if you need me before  Need shingrix vaccines at pharmacy, please get them  You are referred to Dr Dorris Fetch for follow up  Please stop sodas  Labs today, CBC, vit D ,lipid, cmp and eGFr, PSA, PTH intact and calcium, Free T4 and TSH  Needs cologuard asap, overdue, next colonoscopy due END December 2025, so needs to get cologuard asap, pls explain to his Mom , I had said he did not need this but he does, please order  It is important that you exercise regularly at least 30 minutes 5 times a week. If you develop chest pain, have severe difficulty breathing, or feel very tired, stop exercising immediately and seek medical attention   Thanks for choosing [facility], we consider it a privelige to serve you.

## 2021-10-18 NOTE — Progress Notes (Signed)
Terry Soto     MRN: [id number]      DOB: 1966-01-29   HPI Terry Soto is here for follow up and re-evaluation of chronic medical conditions, medication management and review of any available recent lab and radiology data.  Preventive health is updated, specifically  Cancer screening and Immunization.   Questions or concerns regarding consultations or procedures which the PT has had in the interim are  addressed. The PT denies any adverse reactions to current medications since the last visit.  There are no new concerns.  There are no specific complaints   ROS Denies recent fever or chills. Denies sinus pressure, nasal congestion, ear pain or sore throat. Denies chest congestion, productive cough or wheezing. Denies chest pains, palpitations and leg swelling Denies abdominal pain, nausea, vomiting,diarrhea or constipation.   Denies dysuria, frequency, hesitancy or incontinence. Denies joint pain, swelling and limitation in mobility. Denies headaches, seizures, numbness, or tingling. Denies depression, anxiety or insomnia. Denies skin break down or rash.   PE  BP 132/81 (BP Location: Right Arm, Patient Position: Sitting, Cuff Size: Large)    Pulse 97    Ht 5\' 6"  (1.676 m)    Wt 189 lb (85.7 kg)    SpO2 98%    BMI 30.51 kg/m   Patient alert and oriented and in no cardiopulmonary distress.  HEENT: No facial asymmetry, EOMI,     Neck supple .  Chest: Clear to auscultation bilaterally.  CVS: S1, S2 no murmurs, no S3.Regular rate.  ABD: Soft non tender.   Ext: No edema  MS: Adequate ROM spine, shoulders, hips and knees.  Skin: Intact, no ulcerations or rash noted.  Psych: Good eye contact, normal affect. Memory intact not anxious or depressed appearing.  CNS: CN 2-12 intact, power,  normal throughout.no focal deficits noted.   Assessment & Plan  Other hyperparathyroidism (HCC) Needs endo appt, labs ordered  From record review  Essential hypertension DASH diet and  commitment to daily physical activity for a minimum of 30 minutes discussed and encouraged, as a part of hypertension management. The importance of attaining a healthy weight is also discussed.  BP/Weight 10/18/2021 04/17/2021 01/01/2021 10/09/2020 09/12/2020 06/19/2020 03/08/2020  Systolic BP 132 127 126 - - 136 118  Diastolic BP 81 85 85 - - 86 79  Wt. (Lbs) 189 185 178.8 174 174 171.4 168  BMI 30.51 28.98 28.43 27.66 27.66 27.25 26.71     Controlled, no change in medication   Hyperlipidemia LDL goal <100 Hyperlipidemia:Low fat diet discussed and encouraged.   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  The importance of healthy food choices with portion control discussed, as well as eating regularly and within a 12 hour window most days. The need to choose "clean , green" food 50 to 75% of the time is discussed, as well as to make water the primary drink and set a goal of 64 ounces water daily.    Weight /BMI 10/18/2021 04/17/2021 01/01/2021  WEIGHT 189 lb 185 lb 178 lb 12.8 oz  HEIGHT 5\' 6"  5\' 7"  5' 6.5"  BMI 30.51 kg/m2 28.98 kg/m2 28.43 kg/m2      Lumbar disc disease Denies any recent back or lower extremity pain, stable

## 2021-10-20 LAB — CMP14+EGFR
ALT: 37 IU/L (ref 0–44)
AST: 27 IU/L (ref 0–40)
Albumin/Globulin Ratio: 2.1 (ref 1.2–2.2)
Albumin: 4.8 g/dL (ref 3.8–4.9)
Alkaline Phosphatase: 98 IU/L (ref 44–121)
BUN/Creatinine Ratio: 12 (ref 9–20)
BUN: 11 mg/dL (ref 6–24)
Bilirubin Total: <0.2 mg/dL (ref 0.0–1.2)
CO2: 25 mmol/L (ref 20–29)
Calcium: 11 mg/dL — ABNORMAL HIGH (ref 8.7–10.2)
Chloride: 101 mmol/L (ref 96–106)
Creatinine, Ser: 0.89 mg/dL (ref 0.76–1.27)
Globulin, Total: 2.3 g/dL (ref 1.5–4.5)
Glucose: 111 mg/dL — ABNORMAL HIGH (ref 70–99)
Potassium: 4.2 mmol/L (ref 3.5–5.2)
Sodium: 140 mmol/L (ref 134–144)
Total Protein: 7.1 g/dL (ref 6.0–8.5)
eGFR: 102 mL/min/{1.73_m2} (ref >59)

## 2021-10-20 LAB — CBC
Hematocrit: 45.3 % (ref 37.5–51.0)
Hemoglobin: 14.7 g/dL (ref 13.0–17.7)
MCH: 29.3 pg (ref 26.6–33.0)
MCHC: 32.5 g/dL (ref 31.5–35.7)
MCV: 90 fL (ref 79–97)
Platelets: 288 10*3/uL (ref 150–450)
RBC: 5.01 x10E6/uL (ref 4.14–5.80)
RDW: 13.3 % (ref 11.6–15.4)
WBC: 6.9 10*3/uL (ref 3.4–10.8)

## 2021-10-20 LAB — LIPID PANEL
Chol/HDL Ratio: 3.4 ratio (ref 0.0–5.0)
Cholesterol, Total: 169 mg/dL (ref 100–199)
HDL: 49 mg/dL (ref >39)
LDL Chol Calc (NIH): 100 mg/dL — ABNORMAL HIGH (ref 0–99)
Triglycerides: 111 mg/dL (ref 0–149)
VLDL Cholesterol Cal: 20 mg/dL (ref 5–40)

## 2021-10-20 LAB — VITAMIN D 25 HYDROXY (VIT D DEFICIENCY, FRACTURES): Vit D, 25-Hydroxy: 37.1 ng/mL (ref 30.0–100.0)

## 2021-10-20 LAB — TSH+FREE T4
Free T4: 1.28 ng/dL (ref 0.82–1.77)
TSH: 0.376 u[IU]/mL — ABNORMAL LOW (ref 0.450–4.500)

## 2021-10-20 LAB — PTH, INTACT AND CALCIUM: PTH: 35 pg/mL (ref 15–65)

## 2021-10-20 LAB — PSA: Prostate Specific Ag, Serum: 0.7 ng/mL (ref 0.0–4.0)

## 2021-10-22 ENCOUNTER — Encounter: Payer: Self-pay | Admitting: Family Medicine

## 2021-10-22 ENCOUNTER — Other Ambulatory Visit: Payer: Self-pay

## 2021-10-22 DIAGNOSIS — R7301 Impaired fasting glucose: Secondary | ICD-10-CM

## 2021-10-22 NOTE — Assessment & Plan Note (Signed)
Denies any recent back or lower extremity pain, stable

## 2021-10-22 NOTE — Assessment & Plan Note (Signed)
°  Patient re-educated about  the importance of commitment to a  minimum of 150 minutes of exercise per week as able.  The importance of healthy food choices with portion control discussed, as well as eating regularly and within a 12 hour window most days. The need to choose "clean , green" food 50 to 75% of the time is discussed, as well as to make water the primary drink and set a goal of 64 ounces water daily.    Weight /BMI 10/18/2021 04/17/2021 01/01/2021  WEIGHT 189 lb 185 lb 178 lb 12.8 oz  HEIGHT 5\' 6"  5\' 7"  5' 6.5"  BMI 30.51 kg/m2 28.98 kg/m2 28.43 kg/m2

## 2021-10-22 NOTE — Assessment & Plan Note (Signed)
DASH diet and commitment to daily physical activity for a minimum of 30 minutes discussed and encouraged, as a part of hypertension management. The importance of attaining a healthy weight is also discussed.  BP/Weight 10/18/2021 04/17/2021 01/01/2021 10/09/2020 09/12/2020 06/19/2020 03/08/2020  Systolic BP 132 127 126 - - 136 118  Diastolic BP 81 85 85 - - 86 79  Wt. (Lbs) 189 185 178.8 174 174 171.4 168  BMI 30.51 28.98 28.43 27.66 27.66 27.25 26.71     Controlled, no change in medication

## 2021-10-22 NOTE — Assessment & Plan Note (Signed)
Managed by Endo, las ordered and referred for appt

## 2021-10-22 NOTE — Assessment & Plan Note (Signed)
Hyperlipidemia:Low fat diet discussed and encouraged.   Lipid Panel  Lab Results  Component Value Date   CHOL 169 10/18/2021   HDL 49 10/18/2021   LDLCALC 100 (H) 10/18/2021   TRIG 111 10/18/2021   CHOLHDL 3.4 10/18/2021  needs to reduce  Fat in diet , no med change

## 2021-10-22 NOTE — Assessment & Plan Note (Signed)
Stable and managed by Psych, controlled

## 2021-10-24 LAB — SPECIMEN STATUS REPORT

## 2021-10-24 LAB — HEMOGLOBIN A1C
Est. average glucose Bld gHb Est-mCnc: 137 mg/dL
Hgb A1c MFr Bld: 6.4 % — ABNORMAL HIGH (ref 4.8–5.6)

## 2021-10-25 ENCOUNTER — Other Ambulatory Visit: Payer: Self-pay

## 2021-10-25 ENCOUNTER — Telehealth: Payer: Self-pay | Admitting: Family Medicine

## 2021-10-25 ENCOUNTER — Ambulatory Visit (INDEPENDENT_AMBULATORY_CARE_PROVIDER_SITE_OTHER): Payer: Medicare Other | Admitting: "Endocrinology

## 2021-10-25 ENCOUNTER — Encounter: Payer: Self-pay | Admitting: "Endocrinology

## 2021-10-25 VITALS — BP 130/82 | HR 104 | Ht 66.0 in | Wt 189.0 lb

## 2021-10-25 DIAGNOSIS — R7303 Prediabetes: Secondary | ICD-10-CM | POA: Insufficient documentation

## 2021-10-25 DIAGNOSIS — E212 Other hyperparathyroidism: Secondary | ICD-10-CM

## 2021-10-25 DIAGNOSIS — E559 Vitamin D deficiency, unspecified: Secondary | ICD-10-CM | POA: Diagnosis not present

## 2021-10-25 DIAGNOSIS — Z6830 Body mass index (BMI) 30.0-30.9, adult: Secondary | ICD-10-CM

## 2021-10-25 DIAGNOSIS — E6609 Other obesity due to excess calories: Secondary | ICD-10-CM | POA: Insufficient documentation

## 2021-10-25 DIAGNOSIS — E0590 Thyrotoxicosis, unspecified without thyrotoxic crisis or storm: Secondary | ICD-10-CM

## 2021-10-25 NOTE — Telephone Encounter (Signed)
Nickolaus Bordelon called in on patient behalf in regard to cologuard,    States that patients does not know how to use the system and will not allow her to assist.  Would like a call back with instruction on how to use cologuard , and if can not use cologuard patient would like to have procedure done at [facility] .

## 2021-10-25 NOTE — Patient Instructions (Signed)

## 2021-10-25 NOTE — Telephone Encounter (Signed)
Duplicate message sent to dr simpson already
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
## 2021-10-29 ENCOUNTER — Telehealth: Payer: Self-pay | Admitting: Family Medicine

## 2021-10-29 NOTE — Telephone Encounter (Signed)
Called back in regard to paste tele

## 2021-10-31 NOTE — Telephone Encounter (Signed)
His mother is wondering if Terry Soto can have his colonoscopy done at the hospital instead of doing the colorgard due to difficulties trying to get specimen. Please advise ?

## 2021-11-01 NOTE — Telephone Encounter (Signed)
Mother aware

## 2022-01-10 ENCOUNTER — Other Ambulatory Visit: Payer: Self-pay | Admitting: Family Medicine

## 2022-02-03 ENCOUNTER — Other Ambulatory Visit: Payer: Self-pay | Admitting: Family Medicine

## 2022-02-26 ENCOUNTER — Other Ambulatory Visit: Payer: Self-pay | Admitting: Family Medicine

## 2022-04-05 ENCOUNTER — Other Ambulatory Visit: Payer: Self-pay | Admitting: Family Medicine

## 2022-04-17 ENCOUNTER — Encounter: Payer: Self-pay | Admitting: Family Medicine

## 2022-04-17 ENCOUNTER — Ambulatory Visit (INDEPENDENT_AMBULATORY_CARE_PROVIDER_SITE_OTHER): Payer: Medicare Other | Admitting: Family Medicine

## 2022-04-17 DIAGNOSIS — I10 Essential (primary) hypertension: Secondary | ICD-10-CM | POA: Diagnosis not present

## 2022-04-17 DIAGNOSIS — E0590 Thyrotoxicosis, unspecified without thyrotoxic crisis or storm: Secondary | ICD-10-CM

## 2022-04-17 DIAGNOSIS — R062 Wheezing: Secondary | ICD-10-CM | POA: Diagnosis not present

## 2022-04-17 DIAGNOSIS — R7303 Prediabetes: Secondary | ICD-10-CM

## 2022-04-17 DIAGNOSIS — F29 Unspecified psychosis not due to a substance or known physiological condition: Secondary | ICD-10-CM

## 2022-04-17 DIAGNOSIS — E785 Hyperlipidemia, unspecified: Secondary | ICD-10-CM

## 2022-04-17 DIAGNOSIS — F79 Unspecified intellectual disabilities: Secondary | ICD-10-CM

## 2022-04-17 NOTE — Patient Instructions (Signed)
F/u in 6 months in office , call if you need me sooner  Please get the shingrix vaccines at your pharmacy, best to start now  Please get covid booster when available  Please call and  schedule September appt for flu vaccine  Fasting lipid, cmp and EGFR and hBA1C  in September  Keep active  Reduce sugar and starchy foods  Thanks for choosing [facility], we consider it a privelige to serve you.

## 2022-04-17 NOTE — Progress Notes (Signed)
Virtual Visit via telephone Note  I connected with Terry Soto and mother Teshawn Moan, on 04/17/22 at  1:00 PM EDT by  telemedicine application and verified that I am speaking with the correct person using two identifiers.  Location: Patient: home Provider: office   I discussed the limitations of evaluation and management by telemedicine and the availability of in person appointments. The patient expressed understanding and agreed to proceed.  History of Present Illness:Mother is main historian   f/u chronic problems and update health maintainace  Patient states " no problems" Mother affirms that he is doing well and she has no concerns  currently  Denies recent fever or chills. Denies sinus pressure, nasal congestion, ear pain or sore throat. Denies chest congestion, productive cough or wheezing. Denies chest pains, palpitations and leg swelling Denies abdominal pain, nausea, vomiting,diarrhea or constipation.   Denies dysuria, frequency, hesitancy or incontinence. Denies depression, anxiety or insomnia. Denies skin break down or rash.    Observations/Objective: There were no vitals taken for this visit. Good communication with no confusion and intact memory. Alert and oriented x 3 No signs of respiratory distress during speech   Assessment and Plan:  Essential hypertension DASH diet and commitment to daily physical activity for a minimum of 30 minutes discussed and encouraged, as a part of hypertension management. The importance of attaining a healthy weight is also discussed.     10/25/2021    3:26 PM 10/18/2021    1:09 PM 04/17/2021    1:15 PM 01/01/2021    1:19 PM 10/09/2020    9:50 AM 09/12/2020    1:13 PM 06/19/2020    1:22 PM  BP/Weight  Systolic BP 130 132 127 126   409  Diastolic BP 82 81 85 85   86  Wt. (Lbs) 189 189 185 178.8 174 174 171.4  BMI 30.51 kg/m2 30.51 kg/m2 28.98 kg/m2 28.43 kg/m2 27.66 kg/m2 27.66 kg/m2 27.25 kg/m2      Controlled  Prediabetes Patient educated about the importance of limiting  Carbohydrate intake , the need to commit to daily physical activity for a minimum of 30 minutes , and to commit weight loss. The fact that changes in all these areas will reduce or eliminate all together the development of diabetes is stressed.  Updated lab needed at/ before next visit.     Latest Ref Rng & Units 10/18/2021    1:58 PM 10/24/2020    9:38 AM 03/22/2020   11:03 AM 09/11/2018   11:49 AM 11/24/2017   12:41 PM  Diabetic Labs  HbA1c 4.8 - 5.6 % 6.4       Chol 100 - 199 mg/dL 811  914  782  956  213   HDL >39 mg/dL 49  50  41  40  45   Calc LDL 0 - 99 mg/dL 086  578  92  89  83   Triglycerides 0 - 149 mg/dL 469  66  68  79  63   Creatinine 0.76 - 1.27 mg/dL 6.29  5.28  4.13  2.44  0.87       10/25/2021    3:26 PM 10/18/2021    1:09 PM 04/17/2021    1:15 PM 01/01/2021    1:19 PM 10/09/2020    9:50 AM 09/12/2020    1:13 PM 06/19/2020    1:22 PM  BP/Weight  Systolic BP 130 132 127 126   010  Diastolic BP 82 81 85 85   86  Wt. (  Lbs) 189 189 185 178.8 174 174 171.4  BMI 30.51 kg/m2 30.51 kg/m2 28.98 kg/m2 28.43 kg/m2 27.66 kg/m2 27.66 kg/m2 27.25 kg/m2       No data to display            Hyperlipidemia LDL goal <100 Hyperlipidemia:Low fat diet discussed and encouraged.   Lipid Panel  Lab Results  Component Value Date   CHOL 169 10/18/2021   HDL 49 10/18/2021   LDLCALC 100 (H) 10/18/2021   TRIG 111 10/18/2021   CHOLHDL 3.4 10/18/2021   Needs to lower fat intake Updated lab needed at/ before next visit.     Wheezing Controlled, no change in medication   Nonorganic psychosis Controlled, stable , managed by Psych  Hypercalcemia Managed by Endo, stable  Subclinical hyperthyroidism Followed by Endo, stable  MENTAL RETARDATION unchnaged  Follow Up Instructions:    I discussed the assessment and treatment plan with the patient. The patient was provided an opportunity  to ask questions and all were answered. The patient agreed with the plan and demonstrated an understanding of the instructions.   The patient was advised to call back or seek an in-person evaluation if the symptoms worsen or if the condition fails to improve as anticipated.  I provided 12 minutes of non-face-to-face time during this encounter.   Syliva Overman, MD

## 2022-04-20 NOTE — Assessment & Plan Note (Signed)
Followed by Endo, stable 

## 2022-04-20 NOTE — Assessment & Plan Note (Signed)
Patient educated about the importance of limiting  Carbohydrate intake , the need to commit to daily physical activity for a minimum of 30 minutes , and to commit weight loss. The fact that changes in all these areas will reduce or eliminate all together the development of diabetes is stressed.  Updated lab needed at/ before next visit.     Latest Ref Rng & Units 10/18/2021    1:58 PM 10/24/2020    9:38 AM 03/22/2020   11:03 AM 09/11/2018   11:49 AM 11/24/2017   12:41 PM  Diabetic Labs  HbA1c 4.8 - 5.6 % 6.4       Chol 100 - 199 mg/dL 938  101  751  025  852   HDL >39 mg/dL 49  50  41  40  45   Calc LDL 0 - 99 mg/dL 778  242  92  89  83   Triglycerides 0 - 149 mg/dL 353  66  68  79  63   Creatinine 0.76 - 1.27 mg/dL 6.14  4.31  5.40  0.86  0.87       10/25/2021    3:26 PM 10/18/2021    1:09 PM 04/17/2021    1:15 PM 01/01/2021    1:19 PM 10/09/2020    9:50 AM 09/12/2020    1:13 PM 06/19/2020    1:22 PM  BP/Weight  Systolic BP 130 132 127 126   761  Diastolic BP 82 81 85 85   86  Wt. (Lbs) 189 189 185 178.8 174 174 171.4  BMI 30.51 kg/m2 30.51 kg/m2 28.98 kg/m2 28.43 kg/m2 27.66 kg/m2 27.66 kg/m2 27.25 kg/m2       No data to display

## 2022-04-20 NOTE — Assessment & Plan Note (Signed)
unchnaged °

## 2022-04-20 NOTE — Assessment & Plan Note (Signed)
Hyperlipidemia:Low fat diet discussed and encouraged.   Lipid Panel  Lab Results  Component Value Date   CHOL 169 10/18/2021   HDL 49 10/18/2021   LDLCALC 100 (H) 10/18/2021   TRIG 111 10/18/2021   CHOLHDL 3.4 10/18/2021   Needs to lower fat intake Updated lab needed at/ before next visit.

## 2022-04-20 NOTE — Assessment & Plan Note (Signed)
Controlled, no change in medication  

## 2022-04-20 NOTE — Assessment & Plan Note (Signed)
Controlled, stable , managed by Psych

## 2022-04-20 NOTE — Assessment & Plan Note (Signed)
Managed by Endo, stable 

## 2022-04-20 NOTE — Assessment & Plan Note (Signed)
DASH diet and commitment to daily physical activity for a minimum of 30 minutes discussed and encouraged, as a part of hypertension management. The importance of attaining a healthy weight is also discussed.     10/25/2021    3:26 PM 10/18/2021    1:09 PM 04/17/2021    1:15 PM 01/01/2021    1:19 PM 10/09/2020    9:50 AM 09/12/2020    1:13 PM 06/19/2020    1:22 PM  BP/Weight  Systolic BP 130 132 127 126   833  Diastolic BP 82 81 85 85   86  Wt. (Lbs) 189 189 185 178.8 174 174 171.4  BMI 30.51 kg/m2 30.51 kg/m2 28.98 kg/m2 28.43 kg/m2 27.66 kg/m2 27.66 kg/m2 27.25 kg/m2     Controlled

## 2022-04-24 ENCOUNTER — Ambulatory Visit: Payer: Medicare Other | Admitting: "Endocrinology

## 2022-04-29 DIAGNOSIS — E212 Other hyperparathyroidism: Secondary | ICD-10-CM | POA: Diagnosis not present

## 2022-04-30 LAB — PTH, INTACT AND CALCIUM
Calcium: 10.6 mg/dL — ABNORMAL HIGH (ref 8.7–10.2)
PTH: 37 pg/mL (ref 15–65)

## 2022-05-20 ENCOUNTER — Encounter: Payer: Self-pay | Admitting: "Endocrinology

## 2022-05-20 ENCOUNTER — Ambulatory Visit (INDEPENDENT_AMBULATORY_CARE_PROVIDER_SITE_OTHER): Payer: Medicare Other | Admitting: "Endocrinology

## 2022-05-20 VITALS — BP 152/100 | HR 120 | Ht 66.0 in | Wt 196.6 lb

## 2022-05-20 DIAGNOSIS — E559 Vitamin D deficiency, unspecified: Secondary | ICD-10-CM

## 2022-05-20 DIAGNOSIS — E212 Other hyperparathyroidism: Secondary | ICD-10-CM

## 2022-05-20 DIAGNOSIS — E0590 Thyrotoxicosis, unspecified without thyrotoxic crisis or storm: Secondary | ICD-10-CM

## 2022-05-20 DIAGNOSIS — R7303 Prediabetes: Secondary | ICD-10-CM | POA: Diagnosis not present

## 2022-05-20 DIAGNOSIS — E782 Mixed hyperlipidemia: Secondary | ICD-10-CM

## 2022-05-20 LAB — POCT GLYCOSYLATED HEMOGLOBIN (HGB A1C): HbA1c, POC (controlled diabetic range): 6.2 % (ref 0.0–7.0)
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
## 2022-05-24 ENCOUNTER — Ambulatory Visit: Payer: Medicare Other

## 2022-05-29 ENCOUNTER — Ambulatory Visit: Payer: Medicare Other

## 2022-06-04 ENCOUNTER — Encounter: Payer: Self-pay | Admitting: Family Medicine

## 2022-06-04 ENCOUNTER — Ambulatory Visit (INDEPENDENT_AMBULATORY_CARE_PROVIDER_SITE_OTHER): Payer: Medicare Other | Admitting: Family Medicine

## 2022-06-04 DIAGNOSIS — U071 COVID-19: Secondary | ICD-10-CM | POA: Insufficient documentation

## 2022-06-04 MED ORDER — NIRMATRELVIR/RITONAVIR (PAXLOVID)TABLET: 3.0000 | ORAL_TABLET | Freq: Two times a day (BID) | ORAL | 0 refills | Status: AC

## 2022-06-04 NOTE — Patient Instructions (Addendum)
F/U as before, call if you need me sooner  Nurse appt for flu vaccine in 3 weeks  Paxlovid is prescribed for your  covifd infection  Ensure you take entire course, drink a lot of water and get sufficient rest  If ou develop shortness of breath or increased weakness or high fever, need to go to the emergency room   Thanks for choosing [facility], we consider it a privelige to serve you.

## 2022-06-04 NOTE — Progress Notes (Signed)
Virtual Visit via Telephone Note  I connected with Terry Soto , aunt spoke on patient's behalfon 06/04/22 at  4:40 PM EDT by telephone and verified that I am speaking with the correct person using two identifiers.  Location: Patient: staying with uncle, mother hospitalized with covid Provider: office   I discussed the limitations, risks, security and privacy concerns of performing an evaluation and management service by telephone and the availability of in person appointments. I also discussed with the patient that there may be a patient responsible charge related to this service. The patient expressed understanding and agreed to proceed.   History of Present Illness:   2 day h/o excess cough, noted to have fever , no temp taken, poor appetite noted earlier today Covid positive today  No sOB Observations/Objective: There were no vitals taken for this visit. Good communication with no confusion and intact memory. Alert and oriented x 3 No signs of respiratory distress during speech   Assessment and Plan: Positive self-administered antigen test for COVID-19 paxlovid prescribed, ensure adequate hydration, and rest, also self isolation   Follow Up Instructions:    I discussed the assessment and treatment plan with the patient. The patient was provided an opportunity to ask questions and all were answered. The patient agreed with the plan and demonstrated an understanding of the instructions.   The patient was advised to call back or seek an in-person evaluation if the symptoms worsen or if the condition fails to improve as anticipated.  I provided 8 minutes of non-face-to-face time during this encounter.   Tula Nakayama, MD

## 2022-06-05 ENCOUNTER — Telehealth: Payer: Self-pay | Admitting: Family Medicine

## 2022-06-05 NOTE — Telephone Encounter (Addendum)
Terry Soto called in on patient behalf . Patient is currently in her care mom is in hospital   Pharm states that patient should not take cholesterol med with nirmatrelvir/ritonavir EUA (PAXLOVID) 20 x 150 MG & 10 x 100MG  TABS   Patient needs a call back on [phone]

## 2022-06-05 NOTE — Telephone Encounter (Signed)
Josephine aware he can hold the crestor until done with antiviral

## 2022-06-07 ENCOUNTER — Encounter: Payer: Self-pay | Admitting: Family Medicine

## 2022-06-07 NOTE — Assessment & Plan Note (Signed)
paxlovid prescribed, ensure adequate hydration, and rest, also self isolation

## 2022-06-21 ENCOUNTER — Encounter: Payer: Self-pay | Admitting: Family Medicine

## 2022-06-21 ENCOUNTER — Ambulatory Visit (INDEPENDENT_AMBULATORY_CARE_PROVIDER_SITE_OTHER): Payer: Medicare Other | Admitting: Family Medicine

## 2022-06-21 VITALS — BP 170/100 | HR 92 | Ht 66.0 in | Wt 196.1 lb

## 2022-06-21 DIAGNOSIS — E782 Mixed hyperlipidemia: Secondary | ICD-10-CM | POA: Diagnosis not present

## 2022-06-21 DIAGNOSIS — I10 Essential (primary) hypertension: Secondary | ICD-10-CM | POA: Diagnosis not present

## 2022-06-21 DIAGNOSIS — F29 Unspecified psychosis not due to a substance or known physiological condition: Secondary | ICD-10-CM

## 2022-06-21 DIAGNOSIS — E785 Hyperlipidemia, unspecified: Secondary | ICD-10-CM | POA: Diagnosis not present

## 2022-06-21 DIAGNOSIS — R251 Tremor, unspecified: Secondary | ICD-10-CM | POA: Diagnosis not present

## 2022-06-21 DIAGNOSIS — E0590 Thyrotoxicosis, unspecified without thyrotoxic crisis or storm: Secondary | ICD-10-CM

## 2022-06-21 DIAGNOSIS — Z23 Encounter for immunization: Secondary | ICD-10-CM

## 2022-06-21 DIAGNOSIS — R7303 Prediabetes: Secondary | ICD-10-CM

## 2022-06-21 MED ORDER — AMLODIPINE-OLMESARTAN 5-20 MG PO TABS: 1.0000 | ORAL_TABLET | Freq: Every day | ORAL | 1 refills | Status: DC

## 2022-06-21 NOTE — Patient Instructions (Addendum)
Follow-up in 4 weeks reevaluate blood pressure.  Call if you need me sooner.  Front desk at checkout please update contact info, including new address, name and tele number of Aunt who has brought him today  New  for blood pressure is Azor 5/20 , take 1 tablet once daily.  Labs today lipid CMP and EGFR.already ordered   Flu vaccine today. Please .  Fax prescription for Azor to the new pharmacy. [facility] [place] per direction of his Aunt  We will send rx for crestor to new pharmacy once labs are reviewed  Request pill packing of his medications if possible please Nurse Thanks for choosing [facility], we consider it a privelige to serve you.

## 2022-06-21 NOTE — Assessment & Plan Note (Signed)
Followed by endo.  

## 2022-06-21 NOTE — Assessment & Plan Note (Signed)
Uncontroled, start azor 5/20 and re assess in 4 weeks DASH diet and commitment to daily physical activity for a minimum of 30 minutes discussed and encouraged, as a part of hypertension management. The importance of attaining a healthy weight is also discussed.     06/21/2022   11:18 AM 06/21/2022   11:17 AM 06/21/2022   10:41 AM 05/20/2022   10:58 AM 10/25/2021    3:26 PM 10/18/2021    1:09 PM 04/17/2021    1:15 PM  BP/Weight  Systolic BP 027 253 664 403 474 259 563  Diastolic BP 875 643 98 329 82 81 85  Wt. (Lbs)   196.12 196.6 189 189 185  BMI   31.65 kg/m2 31.73 kg/m2 30.51 kg/m2 30.51 kg/m2 28.98 kg/m2

## 2022-06-21 NOTE — Assessment & Plan Note (Signed)
Managed bu Psych

## 2022-06-21 NOTE — Assessment & Plan Note (Signed)
Patient educated about the importance of limiting  Carbohydrate intake , the need to commit to daily physical activity for a minimum of 30 minutes , and to commit weight loss. The fact that changes in all these areas will reduce or eliminate all together the development of diabetes is stressed.      Latest Ref Rng & Units 05/20/2022   11:21 AM 10/18/2021    1:58 PM 10/24/2020    9:38 AM 03/22/2020   11:03 AM 09/11/2018   11:49 AM  Diabetic Labs  HbA1c 0.0 - 7.0 % 6.2  6.4      Chol 100 - 199 mg/dL  169  168  148  146   HDL >39 mg/dL  49  50  41  40   Calc LDL 0 - 99 mg/dL  100  105  92  89   Triglycerides 0 - 149 mg/dL  111  66  68  79   Creatinine 0.76 - 1.27 mg/dL  0.89  1.14  0.89  0.96       06/21/2022   11:18 AM 06/21/2022   11:17 AM 06/21/2022   10:41 AM 05/20/2022   10:58 AM 10/25/2021    3:26 PM 10/18/2021    1:09 PM 04/17/2021    1:15 PM  BP/Weight  Systolic BP 454 098 119 147 829 562 130  Diastolic BP 865 784 98 696 82 81 85  Wt. (Lbs)   196.12 196.6 189 189 185  BMI   31.65 kg/m2 31.73 kg/m2 30.51 kg/m2 30.51 kg/m2 28.98 kg/m2       No data to display

## 2022-06-21 NOTE — Assessment & Plan Note (Addendum)
Fine tremor with intent, not interfering with function, was on propranolol in the past , holding off on this currently

## 2022-06-21 NOTE — Assessment & Plan Note (Signed)
Hyperlipidemia:Low fat diet discussed and encouraged.   Lipid Panel  Lab Results  Component Value Date   CHOL 169 10/18/2021   HDL 49 10/18/2021   LDLCALC 100 (H) 10/18/2021   TRIG 111 10/18/2021   CHOLHDL 3.4 10/18/2021     Updated lab needed

## 2022-06-21 NOTE — Progress Notes (Signed)
Terry Soto     MRN: [id number]      DOB: February 10, 1966   HPI Terry Soto is here for follow up and re-evaluation of chronic medical conditions, medication management and review of any available recent lab and radiology data.  Recently moved in with Aunt in [place], mother hospitalized for over 20 days , critically ill Aunt has no brought meds, unable to confirm what he is taking Has recovered from recent covid  BP markedly elevated at visit, unlikely he has recently been in benazepril prescribed Notes hand tremor with intentional activity, but able to function  ROS Denies recent fever or chills. Denies sinus pressure, nasal congestion, ear pain or sore throat. Denies chest congestion, productive cough or wheezing. Denies chest pains, palpitations and leg swelling Denies abdominal pain, nausea, vomiting,diarrhea or constipation.   Denies dysuria, frequency, hesitancy or incontinence. Denies joint pain, swelling and limitation in mobility. Denies headaches, seizures, numbness, or tingling.   PE  BP (!) 170/100   Pulse 92   Ht 5\' 6"  (1.676 m)   Wt 196 lb 1.9 oz (89 kg)   SpO2 95%   BMI 31.65 kg/m    Patient alert .  HEENT: No facial asymmetry, EOMI,     Neck supple .  Chest: Clear to auscultation bilaterally.  CVS: S1, S2 no murmurs, no S3.Regular rate.  ABD: Soft non tender.   Ext: No edema  MS: Adequate ROM spine, shoulders, hips and knees.   CNS: CN 2-12 intact, power,  normal throughout.no focal deficits noted.   Assessment & Plan  Essential hypertension Uncontroled, start azor 5/20 and re assess in 4 weeks DASH diet and commitment to daily physical activity for a minimum of 30 minutes discussed and encouraged, as a part of hypertension management. The importance of attaining a healthy weight is also discussed.     06/21/2022   11:18 AM 06/21/2022   11:17 AM 06/21/2022   10:41 AM 05/20/2022   10:58 AM 10/25/2021    3:26 PM 10/18/2021    1:09 PM 04/17/2021     1:15 PM  BP/Weight  Systolic BP 627 035 009 381 829 937 169  Diastolic BP 678 938 98 101 82 81 85  Wt. (Lbs)   196.12 196.6 189 189 185  BMI   31.65 kg/m2 31.73 kg/m2 30.51 kg/m2 30.51 kg/m2 28.98 kg/m2       Mixed hyperlipidemia Hyperlipidemia:Low fat diet discussed and encouraged.   Lipid Panel  Lab Results  Component Value Date   CHOL 169 10/18/2021   HDL 49 10/18/2021   LDLCALC 100 (H) 10/18/2021   TRIG 111 10/18/2021   CHOLHDL 3.4 10/18/2021     Updated lab needed    Prediabetes Patient educated about the importance of limiting  Carbohydrate intake , the need to commit to daily physical activity for a minimum of 30 minutes , and to commit weight loss. The fact that changes in all these areas will reduce or eliminate all together the development of diabetes is stressed.      Latest Ref Rng & Units 05/20/2022   11:21 AM 10/18/2021    1:58 PM 10/24/2020    9:38 AM 03/22/2020   11:03 AM 09/11/2018   11:49 AM  Diabetic Labs  HbA1c 0.0 - 7.0 % 6.2  6.4      Chol 100 - 199 mg/dL  169  168  148  146   HDL >39 mg/dL  49  50  41  40   Calc  LDL 0 - 99 mg/dL  413  244  92  89   Triglycerides 0 - 149 mg/dL  010  66  68  79   Creatinine 0.76 - 1.27 mg/dL  2.72  5.36  6.44  0.34       06/21/2022   11:18 AM 06/21/2022   11:17 AM 06/21/2022   10:41 AM 05/20/2022   10:58 AM 10/25/2021    3:26 PM 10/18/2021    1:09 PM 04/17/2021    1:15 PM  BP/Weight  Systolic BP 170 170 186 152 130 132 127  Diastolic BP 100 100 98 100 82 81 85  Wt. (Lbs)   196.12 196.6 189 189 185  BMI   31.65 kg/m2 31.73 kg/m2 30.51 kg/m2 30.51 kg/m2 28.98 kg/m2       No data to display            Subclinical hyperthyroidism Followed by endo  Nonorganic psychosis Managed bu Psych  Tremor of both hands Fine tremor with intent, not interfering with function, was on propranolol in the past , holding off on this currently

## 2022-06-22 LAB — LIPID PANEL
Chol/HDL Ratio: 3.6 ratio (ref 0.0–5.0)
Cholesterol, Total: 203 mg/dL — ABNORMAL HIGH (ref 100–199)
HDL: 56 mg/dL (ref >39)
LDL Chol Calc (NIH): 128 mg/dL — ABNORMAL HIGH (ref 0–99)
Triglycerides: 108 mg/dL (ref 0–149)
VLDL Cholesterol Cal: 19 mg/dL (ref 5–40)

## 2022-06-22 LAB — CMP14+EGFR
ALT: 35 IU/L (ref 0–44)
AST: 18 IU/L (ref 0–40)
Albumin/Globulin Ratio: 2.1 (ref 1.2–2.2)
Albumin: 5 g/dL — ABNORMAL HIGH (ref 3.8–4.9)
Alkaline Phosphatase: 99 IU/L (ref 44–121)
BUN/Creatinine Ratio: 9 (ref 9–20)
BUN: 9 mg/dL (ref 6–24)
Bilirubin Total: 0.3 mg/dL (ref 0.0–1.2)
CO2: 24 mmol/L (ref 20–29)
Calcium: 11.2 mg/dL — ABNORMAL HIGH (ref 8.7–10.2)
Chloride: 96 mmol/L (ref 96–106)
Creatinine, Ser: 0.96 mg/dL (ref 0.76–1.27)
Globulin, Total: 2.4 g/dL (ref 1.5–4.5)
Glucose: 100 mg/dL — ABNORMAL HIGH (ref 70–99)
Potassium: 4.4 mmol/L (ref 3.5–5.2)
Sodium: 137 mmol/L (ref 134–144)
Total Protein: 7.4 g/dL (ref 6.0–8.5)
eGFR: 93 mL/min/{1.73_m2} (ref >59)

## 2022-06-22 LAB — HEMOGLOBIN A1C
Est. average glucose Bld gHb Est-mCnc: 146 mg/dL
Hgb A1c MFr Bld: 6.7 % — ABNORMAL HIGH (ref 4.8–5.6)

## 2022-06-23 ENCOUNTER — Other Ambulatory Visit: Payer: Self-pay | Admitting: Family Medicine

## 2022-06-23 MED ORDER — METFORMIN HCL 500 MG PO TABS: 500.0000 mg | ORAL_TABLET | Freq: Every day | ORAL | 3 refills | Status: DC

## 2022-06-23 NOTE — Progress Notes (Signed)
Metformin 500  

## 2022-06-24 ENCOUNTER — Other Ambulatory Visit: Payer: Self-pay

## 2022-06-24 DIAGNOSIS — R7303 Prediabetes: Secondary | ICD-10-CM

## 2022-06-24 MED ORDER — METFORMIN HCL 500 MG PO TABS: 500.0000 mg | ORAL_TABLET | Freq: Every day | ORAL | 3 refills | Status: DC

## 2022-08-01 ENCOUNTER — Ambulatory Visit: Payer: Medicare Other | Admitting: Family Medicine

## 2022-08-02 ENCOUNTER — Encounter: Payer: Self-pay | Admitting: Family Medicine

## 2022-08-02 ENCOUNTER — Ambulatory Visit (INDEPENDENT_AMBULATORY_CARE_PROVIDER_SITE_OTHER): Payer: Medicare Other | Admitting: Family Medicine

## 2022-08-02 VITALS — BP 140/92 | HR 117 | Ht 67.0 in | Wt 199.0 lb

## 2022-08-02 DIAGNOSIS — I10 Essential (primary) hypertension: Secondary | ICD-10-CM

## 2022-08-02 DIAGNOSIS — E212 Other hyperparathyroidism: Secondary | ICD-10-CM

## 2022-08-02 DIAGNOSIS — R7303 Prediabetes: Secondary | ICD-10-CM | POA: Diagnosis not present

## 2022-08-02 DIAGNOSIS — F29 Unspecified psychosis not due to a substance or known physiological condition: Secondary | ICD-10-CM | POA: Diagnosis not present

## 2022-08-02 DIAGNOSIS — R251 Tremor, unspecified: Secondary | ICD-10-CM | POA: Diagnosis not present

## 2022-08-02 DIAGNOSIS — E782 Mixed hyperlipidemia: Secondary | ICD-10-CM | POA: Diagnosis not present

## 2022-08-02 MED ORDER — AMLODIPINE-OLMESARTAN 10-20 MG PO TABS: 1.0000 | ORAL_TABLET | Freq: Every day | ORAL | 2 refills | Status: DC

## 2022-08-02 MED ORDER — METFORMIN HCL 500 MG PO TABS: 500.0000 mg | ORAL_TABLET | Freq: Every day | ORAL | 1 refills | Status: DC

## 2022-08-02 NOTE — Patient Instructions (Addendum)
Follow up  to reevaluate uncontrolled  blood pressure, please bring all medications to the visit.  Please call if you need me sooner.  Blood pressure has improved but is still not at goal.  New medication to start this amlodipine/olmesartan 10/20, one  tablet once daily.  Please get fasting lipid CMP and EGFR and HbA1c at the nearest [facility] to you in [place] 5 to 7 days before your next office visit.  You are referred to [facility] neurology for evaluation of tremor.  Shingles vaccines and the current COVID-vaccine are both recommended.  You should get these at your pharmacy.  Discuss  poor sleep, and talking to yourself wioth your Psychiatrist, also look into day program that you will enjoy as things setle out next year  Thanks for choosing [facility], we consider it a privelige to serve you.

## 2022-08-05 ENCOUNTER — Encounter: Payer: Self-pay | Admitting: Family Medicine

## 2022-08-05 NOTE — Assessment & Plan Note (Signed)
Refer to Neurology for reassessment and med management, previous Neurologist has retired

## 2022-08-05 NOTE — Assessment & Plan Note (Signed)
Poor sleep and excessive talking to himself noted , Psychiatry to address

## 2022-08-05 NOTE — Assessment & Plan Note (Signed)
Followed by Endo, needs appt for follow up scheduled

## 2022-08-05 NOTE — Progress Notes (Signed)
   [name]     MRN: [id number]      DOB: 06/04/1966   HPI [name] is here for follow up and re-evaluation of chronic medical conditions, medication management and review of any available recent lab and radiology data.  Preventive health is updated, specifically  Cancer screening and Immunization.   Questions or concerns regarding consultations or procedures which the PT has had in the interim are  addressed. The PT denies any adverse reactions to current medications since the last visit.  Caregiver/ Aunt is concerned about tremor, he had been seen in the past by [facility] for this, office now closed , so needs new Neurologist Caregiver c/o excess talking to himself and poor sleep, sees Psychiatry , both of these need to be discussed with Psych Plan to get involved in day Program as time goes on currently grieving loss of his  Mom  ROS Denies recent fever or chills. Denies sinus pressure, nasal congestion, ear pain or sore throat. Denies chest congestion, productive cough or wheezing. Denies chest pains, palpitations and leg swelling Denies abdominal pain, nausea, vomiting,diarrhea or constipation.   Denies dysuria, frequency, hesitancy or incontinence. Denies joint pain, swelling and limitation in mobility. Denies headaches, seizures, numbness, or tingling. Denies skin break down or rash.   PE  BP (!) 140/92 (BP Location: Left Arm, Patient Position: Sitting, Cuff Size: Large)   Pulse (!) 117   Ht 5\' 7"  (1.702 m)   Wt 199 lb (90.3 kg)   SpO2 95%   BMI 31.17 kg/m   Patient alert  and in no cardiopulmonary distress.  HEENT: No facial asymmetry, EOMI,     Neck supple .  Chest: Clear to auscultation bilaterally.  CVS: S1, S2 no murmurs, no S3.Regular rate.  ABD: Soft non tender.   Ext: No edema  MS: Adequate ROM spine, shoulders, hips and knees.  Skin: Intact, no ulcerations or rash noted.  Psych: Good eye contact, flat  affect.  anxious mildly  depressed  appearing.  CNS: CN 2-12 intact, power,  normal throughout.no focal deficits noted.Tremor at rest , worse with intentional activity   Assessment & Plan  Essential hypertension Improved but not at goal, start azor, and re eval in 4 to 6 weeks DASH diet and commitment to daily physical activity for a minimum of 30 minutes discussed and encouraged, as a part of hypertension management. The importance of attaining a healthy weight is also discussed.     08/02/2022    3:26 PM 08/02/2022    3:17 PM 06/21/2022   11:18 AM 06/21/2022   11:17 AM 06/21/2022   10:41 AM 05/20/2022   10:58 AM 10/25/2021    3:26 PM  BP/Weight  Systolic BP 140 145 170 170 186 152 130  Diastolic BP 92 96 100 100 98 100 82  Wt. (Lbs)  199   196.12 196.6 189  BMI  31.17 kg/m2   31.65 kg/m2 31.73 kg/m2 30.51 kg/m2       Other hyperparathyroidism (HCC) Followed by Endo, needs appt for follow up scheduled  Nonorganic psychosis Poor sleep and excessive talking to himself noted , Psychiatry to address  Tremor of both hands Refer to Neurology for reassessment and med management, previous Neurologist has retired

## 2022-08-05 NOTE — Assessment & Plan Note (Signed)
Improved but not at goal, start azor, and re eval in 4 to 6 weeks DASH diet and commitment to daily physical activity for a minimum of 30 minutes discussed and encouraged, as a part of hypertension management. The importance of attaining a healthy weight is also discussed.     08/02/2022    3:26 PM 08/02/2022    3:17 PM 06/21/2022   11:18 AM 06/21/2022   11:17 AM 06/21/2022   10:41 AM 05/20/2022   10:58 AM 10/25/2021    3:26 PM  BP/Weight  Systolic BP 140 145 170 170 186 152 130  Diastolic BP 92 96 100 100 98 100 82  Wt. (Lbs)  199   196.12 196.6 189  BMI  31.17 kg/m2   31.65 kg/m2 31.73 kg/m2 30.51 kg/m2

## 2022-08-06 DIAGNOSIS — F919 Conduct disorder, unspecified: Secondary | ICD-10-CM | POA: Diagnosis not present

## 2022-08-10 ENCOUNTER — Other Ambulatory Visit: Payer: Self-pay | Admitting: Family Medicine

## 2022-08-18 ENCOUNTER — Other Ambulatory Visit: Payer: Self-pay | Admitting: Family Medicine

## 2022-08-21 ENCOUNTER — Other Ambulatory Visit: Payer: Self-pay | Admitting: Family Medicine

## 2022-08-28 ENCOUNTER — Other Ambulatory Visit: Payer: Self-pay | Admitting: Family Medicine

## 2022-09-24 ENCOUNTER — Telehealth: Payer: Self-pay | Admitting: Family Medicine

## 2022-09-24 MED ORDER — AMLODIPINE BESYLATE 5 MG PO TABS: 5.0000 mg | ORAL_TABLET | Freq: Every day | ORAL | 5 refills | Status: DC

## 2022-09-24 MED ORDER — OLMESARTAN MEDOXOMIL 20 MG PO TABS: 20.0000 mg | ORAL_TABLET | Freq: Every day | ORAL | 5 refills | Status: DC

## 2022-09-24 NOTE — Telephone Encounter (Signed)
I sent in new rx for amlodipine 5 mg daily and olmesartan 20 mg daily, combination azor is not covered , pls check with pharmacy if this is covered, paperwork is in your area, thanks

## 2022-09-24 NOTE — Addendum Note (Signed)
Addended by: Fayrene Helper on: 09/24/2022 08:24 AM   Modules accepted: Orders

## 2022-10-08 ENCOUNTER — Encounter: Payer: Self-pay | Admitting: Internal Medicine

## 2022-10-08 ENCOUNTER — Ambulatory Visit (INDEPENDENT_AMBULATORY_CARE_PROVIDER_SITE_OTHER): Payer: Medicare Other | Admitting: Internal Medicine

## 2022-10-08 VITALS — BP 139/96 | HR 135 | Ht 66.0 in | Wt 192.0 lb

## 2022-10-08 DIAGNOSIS — Z0000 Encounter for general adult medical examination without abnormal findings: Secondary | ICD-10-CM | POA: Diagnosis not present
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
## 2022-10-08 NOTE — Patient Instructions (Signed)
  Terry Soto , Thank you for taking time to come for your Medicare Wellness Visit. I appreciate your ongoing commitment to your health goals. Please review the following plan we discussed and let me know if I can assist you in the future.   These are the goals we discussed:   Regular physical activity is one of the most important things you can do for your health. If you're ready to get the immediate benefits of better sleep, reduced anxiety, and lower blood pressure, here are ways to get started. Look for ways to reduce time sitting and increase time moving. For example, make it a tradition to walk before or after dinner. I recommend at least 150 minutes of moderate-intensity physical activity a week for adults. You might split that into 30 minutes, 5 days a week.  We have provided more information on advance care directives.  This is a list of the screening recommended for you and due dates:  Health Maintenance  Topic Date Due   Zoster (Shingles) Vaccine (1 of 2) Never done   COVID-19 Vaccine (5 - 2023-24 season) 05/03/2022   Medicare Annual Wellness Visit  10/09/2023   Colon Cancer Screening  08/09/2024   DTaP/Tdap/Td vaccine (3 - Td or Tdap) 10/10/2030   Flu Shot  Completed   Hepatitis C Screening: USPSTF Recommendation to screen - Ages 18-79 yo.  Completed   HIV Screening  Completed   HPV Vaccine  Aged Out   Cologuard (Stool DNA test)  Discontinued

## 2022-10-18 ENCOUNTER — Encounter: Payer: Self-pay | Admitting: Family Medicine

## 2022-10-18 ENCOUNTER — Ambulatory Visit: Payer: Medicare Other | Admitting: Family Medicine

## 2022-10-20 ENCOUNTER — Other Ambulatory Visit: Payer: Self-pay | Admitting: Family Medicine

## 2022-10-21 ENCOUNTER — Other Ambulatory Visit: Payer: Self-pay | Admitting: Family Medicine

## 2022-10-23 NOTE — Telephone Encounter (Signed)
Change to amlodipine / valsartan 5/160 send in 30 refill x 2, stop azaor, needs mD re eval in office in  6 to 8 weeks, no appt on file, also let caregiver know of change and why, pls send in script and follow through on messAGE

## 2022-10-24 ENCOUNTER — Other Ambulatory Visit: Payer: Self-pay | Admitting: Family Medicine

## 2022-10-24 ENCOUNTER — Telehealth: Payer: Self-pay | Admitting: Family Medicine

## 2022-10-24 NOTE — Telephone Encounter (Signed)
Called caregiver, no answer

## 2022-10-24 NOTE — Telephone Encounter (Signed)
Pls help in sorting out this medication change New is amlodipine / valsartan per ins coverage , sent in today, had been on amlodipine/olmesartan, no more of that or of amlodipine , and olmesartan separately , so just ONE new med for BP is amlodipine/ valsartan. Please verify with pharmacy and let caregiver know Alsohe needs an appt in office with me scheduled , none on file, re eval BP and  gen f/u in next 4 to 8 weeks, pls schedule. Thank you

## 2022-10-25 ENCOUNTER — Encounter: Payer: Self-pay | Admitting: Family Medicine

## 2022-10-25 ENCOUNTER — Ambulatory Visit (INDEPENDENT_AMBULATORY_CARE_PROVIDER_SITE_OTHER): Payer: Medicare Other | Admitting: Family Medicine

## 2022-10-25 VITALS — BP 128/88 | HR 90 | Ht 66.0 in | Wt 191.0 lb

## 2022-10-25 DIAGNOSIS — E212 Other hyperparathyroidism: Secondary | ICD-10-CM

## 2022-10-25 DIAGNOSIS — E782 Mixed hyperlipidemia: Secondary | ICD-10-CM | POA: Diagnosis not present

## 2022-10-25 DIAGNOSIS — F29 Unspecified psychosis not due to a substance or known physiological condition: Secondary | ICD-10-CM

## 2022-10-25 DIAGNOSIS — R7303 Prediabetes: Secondary | ICD-10-CM

## 2022-10-25 DIAGNOSIS — E6609 Other obesity due to excess calories: Secondary | ICD-10-CM

## 2022-10-25 DIAGNOSIS — I10 Essential (primary) hypertension: Secondary | ICD-10-CM | POA: Diagnosis not present

## 2022-10-25 DIAGNOSIS — Z6830 Body mass index (BMI) 30.0-30.9, adult: Secondary | ICD-10-CM

## 2022-10-25 NOTE — Progress Notes (Signed)
Terry Soto     MRN: [id number]      DOB: August 15, 1966   HPI Terry Soto is here for follow up and re-evaluation of chronic medical conditions, medication management and review of any available recent lab and radiology data.  Preventive health is updated, specifically  Cancer screening and Immunization.   Going to start a Day Program next week states looking forward to this  The PT denies any adverse reactions to current medications since the last visit. Will collect and start new antihypertensive tomorrow Snacks a bit much and craves sweets and gets insistent at times   ROS Denies recent fever or chills. Denies sinus pressure, nasal congestion, ear pain or sore throat. Denies chest congestion, productive cough or wheezing. Denies chest pains, palpitations and leg swelling Denies abdominal pain, nausea, vomiting,diarrhea or constipation.   Denies dysuria, frequency, hesitancy or incontinence. Denies joint pain, swelling and limitation in mobility. Denies headaches, seizures, numbness, or tingling. Denies uncontrolled  depression, anxiety or insomnia. Denies skin break down or rash.   PE  BP 128/88   Pulse 90   Ht '5\' 6"'$  (1.676 m)   Wt 191 lb (86.6 kg)   SpO2 96%   BMI 30.83 kg/m   Patient alert  and in no cardiopulmonary distress.  HEENT: No facial asymmetry, EOMI,     Neck supple .  Chest: Clear to auscultation bilaterally.  CVS: S1, S2 no murmurs, no S3.Regular rate.  ABD: Soft non tender.   Ext: No edema  MS: Adequate  though reduced ROM spine, shoulders, hips and knees.  Skin: Intact, no ulcerations or rash noted.  Psych: Good eye contact, normal affect. not anxious or depressed appearing.  CNS: CN 2-12 intact, power,  normal throughout.no focal deficits noted.   Assessment & Plan  Essential hypertension Uncontrolled , will start new med and work on weight loss and decreased salt intake also commit to exercise DASH diet and commitment to daily physical  activity for a minimum of 30 minutes discussed and encouraged, as a part of hypertension management. The importance of attaining a healthy weight is also discussed.     10/25/2022   10:29 AM 10/25/2022   10:08 AM 10/08/2022    9:13 AM 08/02/2022    3:26 PM 08/02/2022    3:17 PM 06/21/2022   11:18 AM 06/21/2022   11:17 AM  BP/Weight  Systolic BP 0000000 0000000 [id number]  Diastolic BP 88 88 96 92 96 100 100  Wt. (Lbs)  191 192  199    BMI  30.83 kg/m2 30.99 kg/m2  31.17 kg/m2         Other hyperparathyroidism (HCC) Followed by Endo  Mixed hyperlipidemia Hyperlipidemia:Low fat diet discussed and encouraged.   Lipid Panel  Lab Results  Component Value Date   CHOL 203 (H) 06/21/2022   HDL 56 06/21/2022   LDLCALC 128 (H) 06/21/2022   TRIG 108 06/21/2022   CHOLHDL 3.6 06/21/2022     Updated lab needed at/ before next visit. Not at goal  Class 1 obesity due to excess calories with serious comorbidity and body mass index (BMI) of 30.0 to 30.9 in adult  Patient re-educated about  the importance of commitment to a  minimum of 150 minutes of exercise per week as able.  The importance of healthy food choices with portion control discussed, as well as eating regularly and within a 12 hour window most days. The need to choose "clean , green" food 50 to  75% of the time is discussed, as well as to make water the primary drink and set a goal of 64 ounces water daily.       10/25/2022   10:08 AM 10/08/2022    9:13 AM 08/02/2022    3:17 PM  Weight /BMI  Weight 191 lb 192 lb 199 lb  Height '5\' 6"'$  (1.676 m) '5\' 6"'$  (1.676 m) '5\' 7"'$  (1.702 m)  BMI 30.83 kg/m2 30.99 kg/m2 31.17 kg/m2      Prediabetes Updated lab needed at/ before next visit.   Nonorganic psychosis Stable and managed by Psych

## 2022-10-25 NOTE — Telephone Encounter (Signed)
Has appt today

## 2022-10-25 NOTE — Assessment & Plan Note (Signed)
Hyperlipidemia:Low fat diet discussed and encouraged.   Lipid Panel  Lab Results  Component Value Date   CHOL 203 (H) 06/21/2022   HDL 56 06/21/2022   LDLCALC 128 (H) 06/21/2022   TRIG 108 06/21/2022   CHOLHDL 3.6 06/21/2022     Updated lab needed at/ before next visit. Not at goal

## 2022-10-25 NOTE — Telephone Encounter (Signed)
Caregiver is aware

## 2022-10-25 NOTE — Assessment & Plan Note (Signed)
  Patient re-educated about  the importance of commitment to a  minimum of 150 minutes of exercise per week as able.  The importance of healthy food choices with portion control discussed, as well as eating regularly and within a 12 hour window most days. The need to choose "clean , green" food 50 to 75% of the time is discussed, as well as to make water the primary drink and set a goal of 64 ounces water daily.       10/25/2022   10:08 AM 10/08/2022    9:13 AM 08/02/2022    3:17 PM  Weight /BMI  Weight 191 lb 192 lb 199 lb  Height 5' 6"$  (1.676 m) 5' 6"$  (1.676 m) 5' 7"$  (1.702 m)  BMI 30.83 kg/m2 30.99 kg/m2 31.17 kg/m2

## 2022-10-25 NOTE — Assessment & Plan Note (Signed)
Uncontrolled , will start new med and work on weight loss and decreased salt intake also commit to exercise DASH diet and commitment to daily physical activity for a minimum of 30 minutes discussed and encouraged, as a part of hypertension management. The importance of attaining a healthy weight is also discussed.     10/25/2022   10:29 AM 10/25/2022   10:08 AM 10/08/2022    9:13 AM 08/02/2022    3:26 PM 08/02/2022    3:17 PM 06/21/2022   11:18 AM 06/21/2022   11:17 AM  BP/Weight  Systolic BP 0000000 0000000 [id number]  Diastolic BP 88 88 96 92 96 100 100  Wt. (Lbs)  191 192  199    BMI  30.83 kg/m2 30.99 kg/m2  31.17 kg/m2

## 2022-10-25 NOTE — Assessment & Plan Note (Signed)
Followed by Endo

## 2022-10-25 NOTE — Assessment & Plan Note (Signed)
Stable and managed by Psych

## 2022-10-25 NOTE — Patient Instructions (Signed)
F/U in 8 to 10 weeks , with MD, re evaluate blood pressure and weight and general follow up  Call if you need me before  Please get Shingrix vaccines x 2 and Covid vaccine at your pharmacy  You will thoroughly enjoy going to work and being with more  people, then going back home to be with youir family  CUT OUT sweets, cake and eat more fruit instead please  Dance party 4 nights per week sounds GREAT  Labs ordered in October today please  Thanks for choosing [facility], we consider it a privelige to serve you.

## 2022-10-25 NOTE — Assessment & Plan Note (Signed)
Updated lab needed at/ before next visit.   

## 2022-10-26 LAB — CMP14+EGFR
ALT: 25 IU/L (ref 0–44)
AST: 17 IU/L (ref 0–40)
Albumin/Globulin Ratio: 2 (ref 1.2–2.2)
Albumin: 4.9 g/dL (ref 3.8–4.9)
Alkaline Phosphatase: 100 IU/L (ref 44–121)
BUN/Creatinine Ratio: 10 (ref 9–20)
BUN: 9 mg/dL (ref 6–24)
Bilirubin Total: <0.2 mg/dL (ref 0.0–1.2)
CO2: 24 mmol/L (ref 20–29)
Calcium: 11 mg/dL — ABNORMAL HIGH (ref 8.7–10.2)
Chloride: 102 mmol/L (ref 96–106)
Creatinine, Ser: 0.88 mg/dL (ref 0.76–1.27)
Globulin, Total: 2.5 g/dL (ref 1.5–4.5)
Glucose: 106 mg/dL — ABNORMAL HIGH (ref 70–99)
Potassium: 4.5 mmol/L (ref 3.5–5.2)
Sodium: 141 mmol/L (ref 134–144)
Total Protein: 7.4 g/dL (ref 6.0–8.5)
eGFR: 102 mL/min/{1.73_m2} (ref >59)

## 2022-10-26 LAB — LIPID PANEL
Chol/HDL Ratio: 3.8 ratio (ref 0.0–5.0)
Cholesterol, Total: 139 mg/dL (ref 100–199)
HDL: 37 mg/dL — ABNORMAL LOW (ref >39)
LDL Chol Calc (NIH): 87 mg/dL (ref 0–99)
Triglycerides: 76 mg/dL (ref 0–149)
VLDL Cholesterol Cal: 15 mg/dL (ref 5–40)

## 2022-10-26 LAB — HEMOGLOBIN A1C
Est. average glucose Bld gHb Est-mCnc: 137 mg/dL
Hgb A1c MFr Bld: 6.4 % — ABNORMAL HIGH (ref 4.8–5.6)

## 2022-11-18 ENCOUNTER — Ambulatory Visit: Payer: Medicare Other | Admitting: "Endocrinology

## 2022-12-24 ENCOUNTER — Ambulatory Visit (INDEPENDENT_AMBULATORY_CARE_PROVIDER_SITE_OTHER): Payer: Medicare Other | Admitting: Family Medicine

## 2022-12-24 ENCOUNTER — Encounter: Payer: Self-pay | Admitting: Family Medicine

## 2022-12-24 VITALS — BP 134/84 | HR 102 | Ht 66.0 in | Wt 189.0 lb

## 2022-12-24 DIAGNOSIS — J309 Allergic rhinitis, unspecified: Secondary | ICD-10-CM

## 2022-12-24 DIAGNOSIS — E559 Vitamin D deficiency, unspecified: Secondary | ICD-10-CM

## 2022-12-24 DIAGNOSIS — Z125 Encounter for screening for malignant neoplasm of prostate: Secondary | ICD-10-CM

## 2022-12-24 DIAGNOSIS — I10 Essential (primary) hypertension: Secondary | ICD-10-CM | POA: Diagnosis not present

## 2022-12-24 DIAGNOSIS — E118 Type 2 diabetes mellitus with unspecified complications: Secondary | ICD-10-CM

## 2022-12-24 DIAGNOSIS — E782 Mixed hyperlipidemia: Secondary | ICD-10-CM

## 2022-12-24 DIAGNOSIS — F29 Unspecified psychosis not due to a substance or known physiological condition: Secondary | ICD-10-CM

## 2022-12-24 DIAGNOSIS — E669 Obesity, unspecified: Secondary | ICD-10-CM

## 2022-12-24 DIAGNOSIS — E66811 Obesity, class 1: Secondary | ICD-10-CM

## 2022-12-24 DIAGNOSIS — R7303 Prediabetes: Secondary | ICD-10-CM

## 2022-12-24 MED ORDER — METFORMIN HCL 500 MG PO TABS: 500.0000 mg | ORAL_TABLET | Freq: Every day | ORAL | 1 refills | Status: DC

## 2022-12-24 MED ORDER — MONTELUKAST SODIUM 10 MG PO TABS: ORAL_TABLET | ORAL | 1 refills | Status: DC

## 2022-12-24 MED ORDER — BENZONATATE 100 MG PO CAPS: 100.0000 mg | ORAL_CAPSULE | Freq: Three times a day (TID) | ORAL | 0 refills | Status: DC | PRN

## 2022-12-24 MED ORDER — ROSUVASTATIN CALCIUM 40 MG PO TABS: 40.0000 mg | ORAL_TABLET | Freq: Every day | ORAL | 3 refills | Status: DC

## 2022-12-24 NOTE — Patient Instructions (Addendum)
Follow-up first week in September, call if you need me sooner.  Continue current medications as listed.  Fasting CBC, lipid, cmp and eGFr, TSH, Vit D and PSA, HBA1C urine ACR to be done 5 days before next visit in Septemeber   Please reschedule your appt with Dr Fransico Him, your Endocrinologist, very important that you kepp seeing him about your calcium level, your thyroid gland and parathyroid gland   New for cough is Tessalon Perles 1 tablet  3 times daily as needed.  Continue to work on healthy eating with baked or grilled meats low in fat and reduce sugar intake to food primarily.  Also cut back on white starchy foods like potatoes and rice pasta and bread.  Colored foods from the ground are healthiest those are  vegetables and fruit.  Water is the healthy drink, aim for 64 ounces every day  It is important that you exercise regularly at least 30 minutes 5 times a week. If you develop chest pain, have severe difficulty breathing, or feel very tired, stop exercising immediately and seek medical attention    Thanks for choosing [facility], we consider it a privelige to serve you. Marland Kitchen

## 2022-12-29 ENCOUNTER — Encounter: Payer: Self-pay | Admitting: Family Medicine

## 2022-12-29 DIAGNOSIS — E118 Type 2 diabetes mellitus with unspecified complications: Secondary | ICD-10-CM | POA: Insufficient documentation

## 2022-12-29 NOTE — Assessment & Plan Note (Signed)
Terry Soto is reminded of the importance of commitment to daily physical activity for 30 minutes or more, as able and the need to limit carbohydrate intake to 30 to 60 grams per meal to help with blood sugar control.   The need to take medication as prescribed, test blood sugar as directed, and to call between visits if there is a concern that blood sugar is uncontrolled is also discussed.   Terry Soto is reminded of the importance of daily foot exam, annual eye examination, and good blood sugar, blood pressure and cholesterol control.     Latest Ref Rng & Units 10/25/2022   10:52 AM 06/21/2022   11:33 AM 05/20/2022   11:21 AM 10/18/2021    1:58 PM 10/24/2020    9:38 AM  Diabetic Labs  HbA1c 4.8 - 5.6 % 6.4  6.7  6.2  6.4    Chol 100 - 199 mg/dL 161  096   045  409   HDL >39 mg/dL 37  56   49  50   Calc LDL 0 - 99 mg/dL 87  811   914  782   Triglycerides 0 - 149 mg/dL 76  956   213  66   Creatinine 0.76 - 1.27 mg/dL 0.86  5.78   4.69  6.29       12/24/2022   10:49 AM 12/24/2022    9:35 AM 10/25/2022   10:29 AM 10/25/2022   10:08 AM 10/08/2022    9:13 AM 08/02/2022    3:26 PM 08/02/2022    3:17 PM  BP/Weight  Systolic BP 134 146 128 137 139 140 145  Diastolic BP 84 94 88 88 96 92 96  Wt. (Lbs)  189  191 192  199  BMI  30.51 kg/m2  30.83 kg/m2 30.99 kg/m2  31.17 kg/m2       No data to display            Updated lab needed at/ before next visit.

## 2022-12-29 NOTE — Assessment & Plan Note (Signed)
Stable and treated by Psych 

## 2022-12-29 NOTE — Assessment & Plan Note (Signed)
  Patient re-educated about  the importance of commitment to a  minimum of 150 minutes of exercise per week as able.  The importance of healthy food choices with portion control discussed, as well as eating regularly and within a 12 hour window most days. The need to choose "clean , green" food 50 to 75% of the time is discussed, as well as to make water the primary drink and set a goal of 64 ounces water daily.       12/24/2022    9:35 AM 10/25/2022   10:08 AM 10/08/2022    9:13 AM  Weight /BMI  Weight 189 lb 191 lb 192 lb  Height 5\' 6"  (1.676 m) 5\' 6"  (1.676 m) 5\' 6"  (1.676 m)  BMI 30.51 kg/m2 30.83 kg/m2 30.99 kg/m2    Improving gradually

## 2022-12-29 NOTE — Assessment & Plan Note (Signed)
Needs to reschedule Endo follow up

## 2022-12-29 NOTE — Assessment & Plan Note (Signed)
Increased cough in past 1 week, tessalon perles prescribed

## 2022-12-29 NOTE — Assessment & Plan Note (Signed)
DASH diet and commitment to daily physical activity for a minimum of 30 minutes discussed and encouraged, as a part of hypertension management. The importance of attaining a healthy weight is also discussed.     12/24/2022   10:49 AM 12/24/2022    9:35 AM 10/25/2022   10:29 AM 10/25/2022   10:08 AM 10/08/2022    9:13 AM 08/02/2022    3:26 PM 08/02/2022    3:17 PM  BP/Weight  Systolic BP 134 146 128 137 139 140 145  Diastolic BP 84 94 88 88 96 92 96  Wt. (Lbs)  189  191 192  199  BMI  30.51 kg/m2  30.83 kg/m2 30.99 kg/m2  31.17 kg/m2     Still not at goal, no ed change, work on lifestyle

## 2022-12-29 NOTE — Progress Notes (Signed)
Terry Soto     MRN: [id number]      DOB: [birth year]  Chief Complaint  Patient presents with   Hypertension    HPI Terry Soto is here for follow up and re-evaluation of chronic medical conditions, medication management and review of any available recent lab and radiology data.  Preventive health is updated, specifically  Cancer screening and Immunization.   Questions or concerns regarding consultations or procedures which the Terry Soto has had in the interim are  addressed. The Terry Soto denies any adverse reactions to current medications since the last visit.  There are no new concerns.  There are no specific complaints   ROS Denies recent fever or chills. Denies sinus pressure, nasal congestion, ear pain or sore throat. Denies chest congestion, productive cough or wheezing. Denies chest pains, palpitations and leg swelling Denies abdominal pain, nausea, vomiting,diarrhea or constipation.   Denies dysuria, frequency, hesitancy or incontinence. Denies joint pain, swelling and limitation in mobility. Denies headaches, seizures, numbness, or tingling. Denies depression, anxiety or insomnia. Denies skin break down or rash.   PE  BP 134/84   Pulse (!) 102   Ht 5\' 6"  (1.676 m)   Wt 189 lb (85.7 kg)   SpO2 97%   BMI 30.51 kg/m   Patient alert and oriented and in no cardiopulmonary distress.  HEENT: No facial asymmetry, EOMI,     Neck supple .  Chest: Clear to auscultation bilaterally.  CVS: S1, S2 no murmurs, no S3.Regular rate.  ABD: Soft non tender.   Ext: No edema  MS: Adequate ROM spine, shoulders, hips and knees.  Skin: Intact, no ulcerations or rash noted.  Psych: Good eye contact, normal affect. Memory intact not anxious or depressed appearing.  CNS: CN 2-12 intact, power,  normal throughout.no focal deficits noted.   Assessment & Plan  Controlled diabetes mellitus type 2 with complications Terry Soto) Terry Soto is reminded of the importance of commitment to daily physical  activity for 30 minutes or more, as able and the need to limit carbohydrate intake to 30 to 60 grams per meal to help with blood sugar control.   The need to take medication as prescribed, test blood sugar as directed, and to call between visits if there is a concern that blood sugar is uncontrolled is also discussed.   Terry Soto is reminded of the importance of daily foot exam, annual eye examination, and good blood sugar, blood pressure and cholesterol control.     Latest Ref Rng & Units 10/25/2022   10:52 AM 06/21/2022   11:33 AM 05/20/2022   11:21 AM 10/18/2021    1:58 PM 10/24/2020    9:38 AM  Diabetic Labs  HbA1c 4.8 - 5.6 % 6.4  6.7  6.2  6.4    Chol 100 - 199 mg/dL 409  811   914  782   HDL >39 mg/dL 37  56   49  50   Calc LDL 0 - 99 mg/dL 87  956   213  086   Triglycerides 0 - 149 mg/dL 76  578   469  66   Creatinine 0.76 - 1.27 mg/dL 6.29  5.28   4.13  2.44       12/24/2022   10:49 AM 12/24/2022    9:35 AM 10/25/2022   10:29 AM 10/25/2022   10:08 AM 10/08/2022    9:13 AM 08/02/2022    3:26 PM 08/02/2022    3:17 PM  BP/Weight  Systolic BP 134 146  128 137 139 140 145  Diastolic BP 84 94 88 88 96 92 96  Wt. (Lbs)  189  191 192  199  BMI  30.51 kg/m2  30.83 kg/m2 30.99 kg/m2  31.17 kg/m2       No data to display            Updated lab needed at/ before next visit.   Essential hypertension DASH diet and commitment to daily physical activity for a minimum of 30 minutes discussed and encouraged, as a part of hypertension management. The importance of attaining a healthy weight is also discussed.     12/24/2022   10:49 AM 12/24/2022    9:35 AM 10/25/2022   10:29 AM 10/25/2022   10:08 AM 10/08/2022    9:13 AM 08/02/2022    3:26 PM 08/02/2022    3:17 PM  BP/Weight  Systolic BP 134 146 128 137 139 140 145  Diastolic BP 84 94 88 88 96 92 96  Wt. (Lbs)  189  191 192  199  BMI  30.51 kg/m2  30.83 kg/m2 30.99 kg/m2  31.17 kg/m2     Still not at goal, no ed change, work on  lifestyle  Mixed hyperlipidemia Hyperlipidemia:Low fat diet discussed and encouraged.   Lipid Panel  Lab Results  Component Value Date   CHOL 139 10/25/2022   HDL 37 (L) 10/25/2022   LDLCALC 87 10/25/2022   TRIG 76 10/25/2022   CHOLHDL 3.8 10/25/2022     Needs to inc exercise  Hypercalcemia Needs to reschedule Endo follow up  Obesity (BMI 30.0-34.9)  Patient re-educated about  the importance of commitment to a  minimum of 150 minutes of exercise per week as able.  The importance of healthy food choices with portion control discussed, as well as eating regularly and within a 12 hour window most days. The need to choose "clean , green" food 50 to 75% of the time is discussed, as well as to make water the primary drink and set a goal of 64 ounces water daily.       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

## 2022-12-29 NOTE — Assessment & Plan Note (Signed)
Hyperlipidemia:Low fat diet discussed and encouraged.   Lipid Panel  Lab Results  Component Value Date   CHOL 139 10/25/2022   HDL 37 (L) 10/25/2022   LDLCALC 87 10/25/2022   TRIG 76 10/25/2022   CHOLHDL 3.8 10/25/2022     Needs to inc exercise

## 2023-01-01 ENCOUNTER — Telehealth: Payer: Self-pay | Admitting: "Endocrinology

## 2023-01-01 NOTE — Telephone Encounter (Signed)
Pt did labs and then had to reschedule appt in March, can we use those labs or does he need new ones.  If he needs new can you put new orders in?

## 2023-01-09 ENCOUNTER — Encounter: Payer: Self-pay | Admitting: "Endocrinology

## 2023-01-09 ENCOUNTER — Ambulatory Visit (INDEPENDENT_AMBULATORY_CARE_PROVIDER_SITE_OTHER): Payer: Medicare Other | Admitting: "Endocrinology

## 2023-01-09 VITALS — BP 156/100 | HR 64 | Ht 66.0 in | Wt 188.8 lb

## 2023-01-09 DIAGNOSIS — E559 Vitamin D deficiency, unspecified: Secondary | ICD-10-CM | POA: Diagnosis not present

## 2023-01-09 DIAGNOSIS — R7303 Prediabetes: Secondary | ICD-10-CM | POA: Diagnosis not present

## 2023-01-09 DIAGNOSIS — E212 Other hyperparathyroidism: Secondary | ICD-10-CM | POA: Diagnosis not present

## 2023-01-09 MED ORDER — HYDROCHLOROTHIAZIDE 25 MG PO TABS: 25.0000 mg | ORAL_TABLET | Freq: Every day | ORAL | 1 refills | Status: DC
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
## 2023-01-22 ENCOUNTER — Other Ambulatory Visit: Payer: Self-pay | Admitting: Family Medicine

## 2023-01-28 ENCOUNTER — Encounter: Payer: Self-pay | Admitting: Family Medicine

## 2023-01-28 NOTE — Telephone Encounter (Signed)
Forms printed and will be completed and signed once Dr Lodema Hong is back in the office

## 2023-05-06 ENCOUNTER — Encounter: Payer: Self-pay | Admitting: Family Medicine

## 2023-05-06 ENCOUNTER — Ambulatory Visit (INDEPENDENT_AMBULATORY_CARE_PROVIDER_SITE_OTHER): Payer: Medicare Other | Admitting: Family Medicine

## 2023-05-06 VITALS — BP 139/93 | HR 122 | Ht 66.0 in | Wt 186.0 lb

## 2023-05-06 DIAGNOSIS — E782 Mixed hyperlipidemia: Secondary | ICD-10-CM | POA: Diagnosis not present

## 2023-05-06 DIAGNOSIS — Z23 Encounter for immunization: Secondary | ICD-10-CM

## 2023-05-06 DIAGNOSIS — E118 Type 2 diabetes mellitus with unspecified complications: Secondary | ICD-10-CM

## 2023-05-06 DIAGNOSIS — E079 Disorder of thyroid, unspecified: Secondary | ICD-10-CM

## 2023-05-06 DIAGNOSIS — I10 Essential (primary) hypertension: Secondary | ICD-10-CM

## 2023-05-06 DIAGNOSIS — R7303 Prediabetes: Secondary | ICD-10-CM

## 2023-05-06 DIAGNOSIS — E212 Other hyperparathyroidism: Secondary | ICD-10-CM

## 2023-05-06 DIAGNOSIS — E559 Vitamin D deficiency, unspecified: Secondary | ICD-10-CM

## 2023-05-06 DIAGNOSIS — Z125 Encounter for screening for malignant neoplasm of prostate: Secondary | ICD-10-CM

## 2023-05-06 DIAGNOSIS — B353 Tinea pedis: Secondary | ICD-10-CM

## 2023-05-06 MED ORDER — AMLODIPINE BESYLATE 2.5 MG PO TABS
2.5000 mg | ORAL_TABLET | Freq: Every day | ORAL | 1 refills | Status: DC
Start: 2023-05-06 — End: 2023-07-08

## 2023-05-06 MED ORDER — CLOTRIMAZOLE-BETAMETHASONE 1-0.05 % EX CREA: 1.0000 | TOPICAL_CREAM | Freq: Two times a day (BID) | CUTANEOUS | 0 refills | Status: DC

## 2023-05-06 NOTE — Patient Instructions (Addendum)
Follow-up in 8 to 10 weeks, reevaluate blood pressure, call if you need me sooner.  Blood pressure is still high, additional medication amlodipine 2.5 mg is to be started.  Continue your other 2 blood pressure medicines these are hydrochlorothiazide and is sore.  Flu vaccine today.  Retinal screening in office to be scheduled   Foot exam today shows fungal infection.  Antifungal cream is prescribed.  Please dry between toes carefully.  You referred for eye exam and I will ask that the appointment be scheduled so that you can have this done.  Labs today CBC lipid panel CMP and EGFR TSH HbA1c , PTH, magnesium, PSA urine ACR.  Thanks for choosing [facility], we consider it a privelige to serve you.

## 2023-05-07 LAB — MICROALBUMIN / CREATININE URINE RATIO
Creatinine, Urine: 35.3 mg/dL
Microalb/Creat Ratio: 25 mg/g{creat} (ref 0–29)
Microalbumin, Urine: 9 ug/mL

## 2023-05-07 LAB — CMP14+EGFR
ALT: 31 IU/L (ref 0–44)
AST: 20 IU/L (ref 0–40)
Albumin: 4.9 g/dL (ref 3.8–4.9)
Alkaline Phosphatase: 97 IU/L (ref 44–121)
BUN/Creatinine Ratio: 13 (ref 9–20)
BUN: 12 mg/dL (ref 6–24)
Bilirubin Total: 0.2 mg/dL (ref 0.0–1.2)
CO2: 25 mmol/L (ref 20–29)
Calcium: 11.9 mg/dL — ABNORMAL HIGH (ref 8.7–10.2)
Chloride: 97 mmol/L (ref 96–106)
Creatinine, Ser: 0.94 mg/dL (ref 0.76–1.27)
Globulin, Total: 3 g/dL (ref 1.5–4.5)
Glucose: 103 mg/dL — ABNORMAL HIGH (ref 70–99)
Potassium: 4 mmol/L (ref 3.5–5.2)
Sodium: 139 mmol/L (ref 134–144)
Total Protein: 7.9 g/dL (ref 6.0–8.5)
eGFR: 95 mL/min/{1.73_m2} (ref >59)

## 2023-05-07 LAB — CBC
Hematocrit: 46.3 % (ref 37.5–51.0)
Hemoglobin: 15.5 g/dL (ref 13.0–17.7)
MCH: 29.2 pg (ref 26.6–33.0)
MCHC: 33.5 g/dL (ref 31.5–35.7)
MCV: 87 fL (ref 79–97)
Platelets: 308 10*3/uL (ref 150–450)
RBC: 5.3 x10E6/uL (ref 4.14–5.80)
RDW: 13.4 % (ref 11.6–15.4)
WBC: 7.6 10*3/uL (ref 3.4–10.8)

## 2023-05-07 LAB — LIPID PANEL
Chol/HDL Ratio: 3.6 ratio (ref 0.0–5.0)
Cholesterol, Total: 172 mg/dL (ref 100–199)
HDL: 48 mg/dL (ref >39)
LDL Chol Calc (NIH): 106 mg/dL — ABNORMAL HIGH (ref 0–99)
Triglycerides: 96 mg/dL (ref 0–149)
VLDL Cholesterol Cal: 18 mg/dL (ref 5–40)

## 2023-05-07 LAB — HEMOGLOBIN A1C
Est. average glucose Bld gHb Est-mCnc: 140 mg/dL
Hgb A1c MFr Bld: 6.5 % — ABNORMAL HIGH (ref 4.8–5.6)

## 2023-05-07 LAB — TSH: TSH: 0.333 u[IU]/mL — ABNORMAL LOW (ref 0.450–4.500)

## 2023-05-07 LAB — PARATHYROID HORMONE, INTACT (NO CA): PTH: 23 pg/mL (ref 15–65)

## 2023-05-07 LAB — MAGNESIUM: Magnesium: 2.4 mg/dL — ABNORMAL HIGH (ref 1.6–2.3)

## 2023-05-11 ENCOUNTER — Encounter: Payer: Self-pay | Admitting: Family Medicine

## 2023-05-11 DIAGNOSIS — B353 Tinea pedis: Secondary | ICD-10-CM | POA: Insufficient documentation

## 2023-05-11 NOTE — Progress Notes (Signed)
Terry Soto     MRN: [id number]      DOB: October 28, 1965  Chief Complaint  Patient presents with   Follow-up    Follow up    HPI Terry Soto is here for follow up and re-evaluation of chronic medical conditions, medication management and review of any available recent lab and radiology data.  Preventive health is updated, specifically  Cancer screening and Immunization.   Questions or concerns regarding consultations or procedures which the PT has had in the interim are  addressed. The PT denies any adverse reactions to current medications since the last visit.  There are no new concerns.  There are no specific complaints   ROS Denies recent fever or chills. Denies sinus pressure, nasal congestion, ear pain or sore throat. Denies chest congestion, productive cough or wheezing. Denies chest pains, palpitations and leg swelling Denies abdominal pain, nausea, vomiting,diarrhea or constipation.   Denies dysuria, frequency, hesitancy or incontinence. Denies joint pain, swelling and limitation in mobility. Denies headaches, seizures, numbness, or tingling. Denies depression, anxiety or insomnia. Denies skin break down or rash.   PE  BP (!) 139/93 (BP Location: Right Arm, Patient Position: Sitting, Cuff Size: Large)   Pulse (!) 122   Ht 5\' 6"  (1.676 m)   Wt 186 lb 0.6 oz (84.4 kg)   SpO2 98%   BMI 30.03 kg/m   Patient alert and oriented and in no cardiopulmonary distress.  HEENT: No facial asymmetry, EOMI,     Neck supple .  Chest: Clear to auscultation bilaterally.  CVS: S1, S2 no murmurs, no S3.Regular rate.  ABD: Soft non tender.   Ext: No edema  MS: Adequate ROM spine, shoulders, hips and knees.  Skin: Intact, no ulcerations or rash noted.  Psych: Good eye contact, normal affect. Memory intact not anxious or depressed appearing.  CNS: CN 2-12 intact, power,  normal throughout.no focal deficits noted.   Assessment & Plan  Essential hypertension Uncontrolled med  dose adjusted re eval in 8 to 10 weeks DASH diet and commitment to daily physical activity for a minimum of 30 minutes discussed and encouraged, as a part of hypertension management. The importance of attaining a healthy weight is also discussed.     05/06/2023    9:51 AM 01/09/2023   11:34 AM 01/09/2023   11:22 AM 12/24/2022   10:49 AM 12/24/2022    9:35 AM 10/25/2022   10:29 AM 10/25/2022   10:08 AM  BP/Weight  Systolic BP 139 156 144 134 146 128 137  Diastolic BP 93 100 96 84 94 88 88  Wt. (Lbs) 186.04  188.8  189  191  BMI 30.03 kg/m2  30.47 kg/m2  30.51 kg/m2  30.83 kg/m2       Disorder of thyroid Followed by Endo  Hypercalcemia Followed by Endo  Mixed hyperlipidemia Hyperlipidemia:Low fat diet discussed and encouraged.   Lipid Panel  Lab Results  Component Value Date   CHOL 172 05/06/2023   HDL 48 05/06/2023   LDLCALC 106 (H) 05/06/2023   TRIG 96 05/06/2023   CHOLHDL 3.6 05/06/2023     Needs to reduce fat in diet  Controlled diabetes mellitus type 2 with complications [facility]) Terry Soto is reminded of the importance of commitment to daily physical activity for 30 minutes or more, as able and the need to limit carbohydrate intake to 30 to 60 grams per meal to help with blood sugar control.   The need to take medication as prescribed, test blood sugar  as directed, and to call between visits if there is a concern that blood sugar is uncontrolled is also discussed.   Terry Soto is reminded of the importance of daily foot exam, annual eye examination, and good blood sugar, blood pressure and cholesterol control.     Latest Ref Rng & Units 05/06/2023   10:43 AM 10/25/2022   10:52 AM 06/21/2022   11:33 AM 05/20/2022   11:21 AM 10/18/2021    1:58 PM  Diabetic Labs  HbA1c 4.8 - 5.6 % 6.5  6.4  6.7  6.2  6.4   Micro/Creat Ratio 0 - 29 mg/g creat 25       Chol 100 - 199 mg/dL 409  811  914   782   HDL >39 mg/dL 48  37  56   49   Calc LDL 0 - 99 mg/dL 956  87  213   086    Triglycerides 0 - 149 mg/dL 96  76  578   469   Creatinine 0.76 - 1.27 mg/dL 6.29  5.28  4.13   2.44       05/06/2023    9:51 AM 01/09/2023   11:34 AM 01/09/2023   11:22 AM 12/24/2022   10:49 AM 12/24/2022    9:35 AM 10/25/2022   10:29 AM 10/25/2022   10:08 AM  BP/Weight  Systolic BP 139 156 144 134 146 128 137  Diastolic BP 93 100 96 84 94 88 88  Wt. (Lbs) 186.04  188.8  189  191  BMI 30.03 kg/m2  30.47 kg/m2  30.51 kg/m2  30.83 kg/m2      05/06/2023    9:40 AM  Foot/eye exam completion dates  Foot Form Completion Done        Tinea pedis Clotrimazole/ betameth cream

## 2023-05-11 NOTE — Assessment & Plan Note (Signed)
Clotrimazole/ betameth cream

## 2023-05-11 NOTE — Assessment & Plan Note (Signed)
Hyperlipidemia:Low fat diet discussed and encouraged.   Lipid Panel  Lab Results  Component Value Date   CHOL 172 05/06/2023   HDL 48 05/06/2023   LDLCALC 106 (H) 05/06/2023   TRIG 96 05/06/2023   CHOLHDL 3.6 05/06/2023     Needs to reduce fat in diet

## 2023-05-11 NOTE — Assessment & Plan Note (Signed)
Followed by Endo 

## 2023-05-11 NOTE — Assessment & Plan Note (Signed)
Uncontrolled med dose adjusted re eval in 8 to 10 weeks DASH diet and commitment to daily physical activity for a minimum of 30 minutes discussed and encouraged, as a part of hypertension management. The importance of attaining a healthy weight is also discussed.     05/06/2023    9:51 AM 01/09/2023   11:34 AM 01/09/2023   11:22 AM 12/24/2022   10:49 AM 12/24/2022    9:35 AM 10/25/2022   10:29 AM 10/25/2022   10:08 AM  BP/Weight  Systolic BP 139 156 144 134 146 128 137  Diastolic BP 93 100 96 84 94 88 88  Wt. (Lbs) 186.04  188.8  189  191  BMI 30.03 kg/m2  30.47 kg/m2  30.51 kg/m2  30.83 kg/m2

## 2023-05-11 NOTE — Assessment & Plan Note (Signed)
Terry Soto is reminded of the importance of commitment to daily physical activity for 30 minutes or more, as able and the need to limit carbohydrate intake to 30 to 60 grams per meal to help with blood sugar control.   The need to take medication as prescribed, test blood sugar as directed, and to call between visits if there is a concern that blood sugar is uncontrolled is also discussed.   Terry Soto is reminded of the importance of daily foot exam, annual eye examination, and good blood sugar, blood pressure and cholesterol control.     Latest Ref Rng & Units 05/06/2023   10:43 AM 10/25/2022   10:52 AM 06/21/2022   11:33 AM 05/20/2022   11:21 AM 10/18/2021    1:58 PM  Diabetic Labs  HbA1c 4.8 - 5.6 % 6.5  6.4  6.7  6.2  6.4   Micro/Creat Ratio 0 - 29 mg/g creat 25       Chol 100 - 199 mg/dL 161  096  045   409   HDL >39 mg/dL 48  37  56   49   Calc LDL 0 - 99 mg/dL 811  87  914   782   Triglycerides 0 - 149 mg/dL 96  76  956   213   Creatinine 0.76 - 1.27 mg/dL 0.86  5.78  4.69   6.29       05/06/2023    9:51 AM 01/09/2023   11:34 AM 01/09/2023   11:22 AM 12/24/2022   10:49 AM 12/24/2022    9:35 AM 10/25/2022   10:29 AM 10/25/2022   10:08 AM  BP/Weight  Systolic BP 139 156 144 134 146 128 137  Diastolic BP 93 100 96 84 94 88 88  Wt. (Lbs) 186.04  188.8  189  191  BMI 30.03 kg/m2  30.47 kg/m2  30.51 kg/m2  30.83 kg/m2      05/06/2023    9:40 AM  Foot/eye exam completion dates  Foot Form Completion Done

## 2023-07-07 ENCOUNTER — Other Ambulatory Visit: Payer: Self-pay | Admitting: "Endocrinology

## 2023-07-07 ENCOUNTER — Other Ambulatory Visit: Payer: Self-pay | Admitting: Family Medicine

## 2023-07-08 ENCOUNTER — Ambulatory Visit: Payer: Medicare Other | Admitting: Family Medicine

## 2023-07-08 ENCOUNTER — Other Ambulatory Visit: Payer: Self-pay | Admitting: Family Medicine

## 2023-07-08 ENCOUNTER — Encounter: Payer: Self-pay | Admitting: Family Medicine

## 2023-07-08 VITALS — BP 138/94 | HR 119 | Resp 16 | Ht 66.0 in | Wt 188.0 lb

## 2023-07-08 DIAGNOSIS — R000 Tachycardia, unspecified: Secondary | ICD-10-CM

## 2023-07-08 DIAGNOSIS — E559 Vitamin D deficiency, unspecified: Secondary | ICD-10-CM

## 2023-07-08 DIAGNOSIS — E118 Type 2 diabetes mellitus with unspecified complications: Secondary | ICD-10-CM

## 2023-07-08 DIAGNOSIS — Z125 Encounter for screening for malignant neoplasm of prostate: Secondary | ICD-10-CM

## 2023-07-08 DIAGNOSIS — I10 Essential (primary) hypertension: Secondary | ICD-10-CM

## 2023-07-08 DIAGNOSIS — I152 Hypertension secondary to endocrine disorders: Secondary | ICD-10-CM

## 2023-07-08 DIAGNOSIS — E0590 Thyrotoxicosis, unspecified without thyrotoxic crisis or storm: Secondary | ICD-10-CM | POA: Diagnosis not present

## 2023-07-08 DIAGNOSIS — E782 Mixed hyperlipidemia: Secondary | ICD-10-CM | POA: Diagnosis not present

## 2023-07-08 DIAGNOSIS — E1159 Type 2 diabetes mellitus with other circulatory complications: Secondary | ICD-10-CM

## 2023-07-08 DIAGNOSIS — R9431 Abnormal electrocardiogram [ECG] [EKG]: Secondary | ICD-10-CM | POA: Insufficient documentation

## 2023-07-08 MED ORDER — AMLODIPINE BESYLATE-VALSARTAN 10-160 MG PO TABS: 1.0000 | ORAL_TABLET | Freq: Every day | ORAL | 2 refills | Status: DC

## 2023-07-08 NOTE — Assessment & Plan Note (Signed)
Updated lab needed at/ before next visit.   

## 2023-07-08 NOTE — Assessment & Plan Note (Signed)
Terry Soto is reminded of the importance of commitment to daily physical activity for 30 minutes or more, as able and the need to limit carbohydrate intake to 30 to 60 grams per meal to help with blood sugar control.   The need to take medication as prescribed, test blood sugar as directed, and to call between visits if there is a concern that blood sugar is uncontrolled is also discussed.   Terry Soto is reminded of the importance of daily foot exam, annual eye examination, and good blood sugar, blood pressure and cholesterol control.     Latest Ref Rng & Units 05/06/2023   10:43 AM 10/25/2022   10:52 AM 06/21/2022   11:33 AM 05/20/2022   11:21 AM 10/18/2021    1:58 PM  Diabetic Labs  HbA1c 4.8 - 5.6 % 6.5  6.4  6.7  6.2  6.4   Micro/Creat Ratio 0 - 29 mg/g creat 25       Chol 100 - 199 mg/dL 034  742  595   638   HDL >39 mg/dL 48  37  56   49   Calc LDL 0 - 99 mg/dL 756  87  433   295   Triglycerides 0 - 149 mg/dL 96  76  188   416   Creatinine 0.76 - 1.27 mg/dL 6.06  3.01  6.01   0.93       07/08/2023   10:33 AM 07/08/2023    9:48 AM 05/06/2023    9:51 AM 01/09/2023   11:34 AM 01/09/2023   11:22 AM 12/24/2022   10:49 AM 12/24/2022    9:35 AM  BP/Weight  Systolic BP 138 137 139 156 144 134 146  Diastolic BP 94 93 93 100 96 84 94  Wt. (Lbs)  188 186.04  188.8  189  BMI  30.34 kg/m2 30.03 kg/m2  30.47 kg/m2  30.51 kg/m2      05/06/2023    9:40 AM  Foot/eye exam completion dates  Foot Form Completion Done      Updated lab needed at/ before next visit.

## 2023-07-08 NOTE — Assessment & Plan Note (Signed)
Uncontrolled  Increase dose of exforge DASH diet and commitment to daily physical activity for a minimum of 30 minutes discussed and encouraged, as a part of hypertension management. The importance of attaining a healthy weight is also discussed.     07/08/2023   10:33 AM 07/08/2023    9:48 AM 05/06/2023    9:51 AM 01/09/2023   11:34 AM 01/09/2023   11:22 AM 12/24/2022   10:49 AM 12/24/2022    9:35 AM  BP/Weight  Systolic BP 138 137 139 156 144 134 146  Diastolic BP 94 93 93 100 96 84 94  Wt. (Lbs)  188 186.04  188.8  189  BMI  30.34 kg/m2 30.03 kg/m2  30.47 kg/m2  30.51 kg/m2

## 2023-07-08 NOTE — Progress Notes (Signed)
Terry Soto     MRN: [id number]      DOB: September 23, 1965  Chief Complaint  Patient presents with   Hypertension    8 week follow up     HPI Terry Soto is here for follow up and re-evaluation of chronic medical conditions, medication management and review of any available recent lab and radiology data.  Preventive health is updated, specifically  Cancer screening and Immunization.   Questions or concerns regarding consultations or procedures which the PT has had in the interim are  addressed. The PT denies any adverse reactions to current medications since the last visit.  There are no new concerns.  There are no specific complaints   ROS Denies recent fever or chills. Denies sinus pressure, nasal congestion, ear pain or sore throat. Denies chest congestion, productive cough or wheezing. Denies chest pains, palpitations and leg swelling Denies abdominal pain, nausea, vomiting,diarrhea or constipation.   Denies dysuria, frequency, hesitancy or incontinence. Denies uncontrolled  joint pain, swelling and limitation in mobility. Denies headaches, seizures, numbness, or tingling. Denies uncontrolled  depression, anxiety or insomnia. Denies skin break down or rash.   PE  BP (!) 138/94   Pulse (!) 119   Resp 16   Ht 5\' 6"  (1.676 m)   Wt 188 lb (85.3 kg)   SpO2 96%   BMI 30.34 kg/m   Patient alert and oriented and in no cardiopulmonary distress.  HEENT: No facial asymmetry, EOMI,     Neck supple .  Chest: Clear to auscultation bilaterally.  CVS: S1, S2 no murmurs, no S3.Regular rate.  ABD: Soft non tender.   Ext: No edema  MS: Adequate ROM spine, shoulders, hips and knees.  Skin: Intact, no ulcerations or rash noted.  Psych: Good eye contact, normal affect. Memory intact not anxious or depressed appearing.  CNS: CN 2-12 intact, power,  normal throughout.no focal deficits noted.   Assessment & Plan  Essential hypertension Uncontrolled  Increase dose of exforge DASH  diet and commitment to daily physical activity for a minimum of 30 minutes discussed and encouraged, as a part of hypertension management. The importance of attaining a healthy weight is also discussed.     07/08/2023   10:33 AM 07/08/2023    9:48 AM 05/06/2023    9:51 AM 01/09/2023   11:34 AM 01/09/2023   11:22 AM 12/24/2022   10:49 AM 12/24/2022    9:35 AM  BP/Weight  Systolic BP 138 137 139 156 144 134 146  Diastolic BP 94 93 93 100 96 84 94  Wt. (Lbs)  188 186.04  188.8  189  BMI  30.34 kg/m2 30.03 kg/m2  30.47 kg/m2  30.51 kg/m2       Tachycardia Persitent tachycardia, refer Cardiology  Abnormal EKG Uncontrolled hypertension, tachycardia and abnorma;l EKG , refer cardiology for veal and management  Mixed hyperlipidemia Hyperlipidemia:Low fat diet discussed and encouraged.   Lipid Panel  Lab Results  Component Value Date   CHOL 172 05/06/2023   HDL 48 05/06/2023   LDLCALC 106 (H) 05/06/2023   TRIG 96 05/06/2023   CHOLHDL 3.6 05/06/2023     Not at goal, needs to lower fat intake Updated lab needed at/ before next visit.    Controlled diabetes mellitus type 2 with complications [facility]) Terry Soto is reminded of the importance of commitment to daily physical activity for 30 minutes or more, as able and the need to limit carbohydrate intake to 30 to 60 grams per meal to help  with blood sugar control.   The need to take medication as prescribed, test blood sugar as directed, and to call between visits if there is a concern that blood sugar is uncontrolled is also discussed.   Terry Soto is reminded of the importance of daily foot exam, annual eye examination, and good blood sugar, blood pressure and cholesterol control.     Latest Ref Rng & Units 05/06/2023   10:43 AM 10/25/2022   10:52 AM 06/21/2022   11:33 AM 05/20/2022   11:21 AM 10/18/2021    1:58 PM  Diabetic Labs  HbA1c 4.8 - 5.6 % 6.5  6.4  6.7  6.2  6.4   Micro/Creat Ratio 0 - 29 mg/g creat 25       Chol 100 - 199  mg/dL 161  096  045   409   HDL >39 mg/dL 48  37  56   49   Calc LDL 0 - 99 mg/dL 811  87  914   782   Triglycerides 0 - 149 mg/dL 96  76  956   213   Creatinine 0.76 - 1.27 mg/dL 0.86  5.78  4.69   6.29       07/08/2023   10:33 AM 07/08/2023    9:48 AM 05/06/2023    9:51 AM 01/09/2023   11:34 AM 01/09/2023   11:22 AM 12/24/2022   10:49 AM 12/24/2022    9:35 AM  BP/Weight  Systolic BP 138 137 139 156 144 134 146  Diastolic BP 94 93 93 100 96 84 94  Wt. (Lbs)  188 186.04  188.8  189  BMI  30.34 kg/m2 30.03 kg/m2  30.47 kg/m2  30.51 kg/m2      05/06/2023    9:40 AM  Foot/eye exam completion dates  Foot Form Completion Done      Updated lab needed at/ before next visit.

## 2023-07-08 NOTE — Patient Instructions (Addendum)
F/U in 4 months, call if you need me sooner  Shingrix and covid vaccines are recommended and are at your pharmacy    You are referred to Cardiologist re rapid heart rate and abnormal EKG also long standing hypertension  Fasting lipid, cmp and eGFr, hBA1C, TSH, free T3 and free T4 and PSA 1 week before next scheduled ap[ointment   Blood pressure is still too high  New higher dose of exforge is 10/160 , one daily, stop old lower dose and stop amlodipine 2.5 mg tablet  Best for 2025!  Thanks for choosing [facility], we consider it a privelige to serve you.

## 2023-07-08 NOTE — Assessment & Plan Note (Signed)
Hyperlipidemia:Low fat diet discussed and encouraged.   Lipid Panel  Lab Results  Component Value Date   CHOL 172 05/06/2023   HDL 48 05/06/2023   LDLCALC 106 (H) 05/06/2023   TRIG 96 05/06/2023   CHOLHDL 3.6 05/06/2023     Not at goal, needs to lower fat intake Updated lab needed at/ before next visit.

## 2023-07-08 NOTE — Assessment & Plan Note (Signed)
Uncontrolled hypertension, tachycardia and abnorma;l EKG , refer cardiology for veal and management

## 2023-07-08 NOTE — Assessment & Plan Note (Signed)
Persitent tachycardia, refer Cardiology

## 2023-07-28 ENCOUNTER — Ambulatory Visit: Payer: Medicare Other | Admitting: "Endocrinology

## 2023-08-22 ENCOUNTER — Other Ambulatory Visit: Payer: Self-pay | Admitting: Family Medicine

## 2023-09-24 ENCOUNTER — Ambulatory Visit: Payer: Medicare Other | Admitting: Cardiology

## 2023-10-02 ENCOUNTER — Other Ambulatory Visit: Payer: Self-pay | Admitting: "Endocrinology

## 2023-10-03 ENCOUNTER — Ambulatory Visit (INDEPENDENT_AMBULATORY_CARE_PROVIDER_SITE_OTHER): Payer: Medicare Other

## 2023-10-03 ENCOUNTER — Encounter: Payer: Self-pay | Admitting: Cardiology

## 2023-10-03 ENCOUNTER — Ambulatory Visit: Payer: Medicare Other | Attending: Cardiology | Admitting: Cardiology

## 2023-10-03 VITALS — BP 136/92 | HR 107 | Resp 16 | Ht 66.0 in | Wt 185.6 lb

## 2023-10-03 DIAGNOSIS — I10 Essential (primary) hypertension: Secondary | ICD-10-CM | POA: Diagnosis not present

## 2023-10-03 DIAGNOSIS — R000 Tachycardia, unspecified: Secondary | ICD-10-CM

## 2023-10-03 DIAGNOSIS — E119 Type 2 diabetes mellitus without complications: Secondary | ICD-10-CM | POA: Diagnosis not present

## 2023-10-03 DIAGNOSIS — E782 Mixed hyperlipidemia: Secondary | ICD-10-CM | POA: Diagnosis present

## 2023-10-03 NOTE — Progress Notes (Unsigned)
 Enrolled patient for a 3 day Zio XT monitor to be mailed to patients home

## 2023-10-03 NOTE — Patient Instructions (Addendum)
Testing/Procedures: ECHO Zio 3 day   Your physician has requested that you have an echocardiogram. Echocardiography is a painless test that uses sound waves to create images of your heart. It provides your doctor with information about the size and shape of your heart and how well your heart's chambers and valves are working. This procedure takes approximately one hour. There are no restrictions for this procedure. Please do NOT wear cologne, perfume, aftershave, or lotions (deodorant is allowed). Please arrive 15 minutes prior to your appointment time.  Please note: We ask at that you not bring children with you during ultrasound (echo/ vascular) testing. Due to room size and safety concerns, children are not allowed in the ultrasound rooms during exams. Our front office staff cannot provide observation of children in our lobby area while testing is being conducted. An adult accompanying a patient to their appointment will only be allowed in the ultrasound room at the discretion of the ultrasound technician under special circumstances. We apologize for any inconvenience.   Your physician has requested that you wear a Zio heart monitor for __3___ days. This will be mailed to your home with instructions on how to apply the monitor and how to return it when finished. Please allow 2 weeks after returning the heart monitor before our office calls you with the results.    Follow-Up: At [facility], you and your health needs are our priority.  As part of our continuing mission to provide you with exceptional heart care, we have created designated Provider Care Teams.  These Care Teams include your primary Cardiologist (physician) and Advanced Practice Providers (APPs -  Physician Assistants and Nurse Practitioners) who all work together to provide you with the care you need, when you need it.  Your next appointment:   As needed    Provider:   Tessa Lerner, DO     Other Instructions  ZIO  XT- Long Term Monitor Instructions     Your physician has requested you wear a ZIO patch monitor for 3 days.  This is a single patch monitor. [employer] supplies one patch monitor per enrollment. Additional  stickers are not available. Please do not apply patch if you will be having a Nuclear Stress Test,  Echocardiogram, Cardiac CT, MRI, or Chest Xray during the period you would be wearing the  monitor. The patch cannot be worn during these tests. You cannot remove and re-apply the  ZIO XT patch monitor.  Your ZIO patch monitor will be mailed 3 day USPS to your address on file. It may take 3-5 days  to receive your monitor after you have been enrolled.  Once you have received your monitor, please review the enclosed instructions. Your monitor  has already been registered assigning a specific monitor serial # to you.     Billing and Patient Assistance Program Information     We have supplied [employer] with any of your insurance information on file for billing purposes.  [employer] offers a sliding scale Patient Assistance Program for patients that do not have  insurance, or whose insurance does not completely cover the cost of the ZIO monitor.  You must apply for the Patient Assistance Program to qualify for this discounted rate.  To apply, please call [employer] at [phone], select option 4, select option 2, ask to apply for  Patient Assistance Program. Meredeth Ide will ask your household income, and how many people  are in your household. They will quote your out-of-pocket cost based on that information.  [employer] will also be able to set up a 20-month, interest-free payment plan if needed.     Applying the monitor     Shave hair from upper left chest.  Hold abrader disc by orange tab. Rub abrader in 40 strokes over the upper left chest as  indicated in your monitor instructions.  Clean area with 4 enclosed alcohol pads. Let dry.  Apply patch as indicated in monitor instructions. Patch will be  placed under collarbone on left  side of chest with arrow pointing upward.  Rub patch adhesive wings for 2 minutes. Remove white label marked "1". Remove the white  label marked "2". Rub patch adhesive wings for 2 additional minutes.  While looking in a mirror, press and release button in center of patch. A small green light will  flash 3-4 times. This will be your only indicator that the monitor has been turned on.  Do not shower for the first 24 hours. You may shower after the first 24 hours.  Press the button if you feel a symptom. You will hear a small click. Record Date, Time and  Symptom in the Patient Logbook.  When you are ready to remove the patch, follow instructions on the last 2 pages of Patient  Logbook. Stick patch monitor onto the last page of Patient Logbook.  Place Patient Logbook in the blue and white box. Use locking tab on box and tape box closed  securely. The blue and white box has prepaid postage on it. Please place it in the mailbox as  soon as possible. Your physician should have your test results approximately 7 days after the  monitor has been mailed back to [facility].  Call [facility] Customer Care at [phone] if you have questions regarding  your ZIO XT patch monitor. Call them immediately if you see an orange light blinking on your  monitor.  If your monitor falls off in less than 4 days, contact our Monitor department at [phone].  If your monitor becomes loose or falls off after 4 days call [employer] at [phone] for  suggestions on securing your monitor.      1st Floor: - Lobby - Registration  - Pharmacy  - Lab - Cafe  2nd Floor: - PV Lab - Diagnostic Testing (echo, CT, nuclear med)  3rd Floor: - Vacant  4th Floor: - TCTS (cardiothoracic surgery) - AFib Clinic - Structural Heart Clinic - Vascular Surgery  - Vascular Ultrasound  5th Floor: - HeartCare Cardiology (general and EP) - Clinical Pharmacy for coumadin,  hypertension, lipid, weight-loss medications, and med management appointments    Valet parking services will be available as well.
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
## 2023-10-05 ENCOUNTER — Other Ambulatory Visit: Payer: Self-pay | Admitting: Family Medicine

## 2023-10-06 DIAGNOSIS — R000 Tachycardia, unspecified: Secondary | ICD-10-CM | POA: Diagnosis not present

## 2023-10-24 ENCOUNTER — Ambulatory Visit (HOSPITAL_COMMUNITY): Payer: Medicare Other | Attending: Cardiology

## 2023-10-24 DIAGNOSIS — R000 Tachycardia, unspecified: Secondary | ICD-10-CM | POA: Insufficient documentation

## 2023-10-24 DIAGNOSIS — I10 Essential (primary) hypertension: Secondary | ICD-10-CM | POA: Diagnosis present

## 2023-10-24 LAB — ECHOCARDIOGRAM COMPLETE
Area-P 1/2: 5.54 cm2
S' Lateral: 2.4 cm

## 2023-10-25 ENCOUNTER — Encounter: Payer: Self-pay | Admitting: Cardiology

## 2023-11-02 ENCOUNTER — Encounter: Payer: Self-pay | Admitting: Cardiology

## 2023-11-05 ENCOUNTER — Ambulatory Visit: Payer: Medicare Other | Admitting: Family Medicine

## 2023-12-24 ENCOUNTER — Ambulatory Visit (INDEPENDENT_AMBULATORY_CARE_PROVIDER_SITE_OTHER): Payer: Medicare Other

## 2023-12-24 VITALS — Ht 66.0 in | Wt 185.0 lb

## 2023-12-24 DIAGNOSIS — Z0000 Encounter for general adult medical examination without abnormal findings: Secondary | ICD-10-CM

## 2023-12-24 DIAGNOSIS — Z0100 Encounter for examination of eyes and vision without abnormal findings: Secondary | ICD-10-CM
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
## 2023-12-24 NOTE — Patient Instructions (Signed)
 [name] , Thank you for taking time to come for your Medicare Wellness Visit. I appreciate your ongoing commitment to your health goals. Please review the following plan we discussed and let me know if I can assist you in the future.   Referrals/Orders/Follow-Ups/Clinician Recommendations:  Next Medicare AWV: December 28, 2024 at 1:50 pm video visit.  You've been referred to [facility]. You can call them to schedule your appointment Address: [address], [place], [place] [address] Phone: [phone]  This is a list of the screening recommended for you and due dates:  Health Maintenance  Topic Date Due   Eye exam for diabetics  Never done   Pneumococcal Vaccination (1 of 2 - PCV) Never done   Zoster (Shingles) Vaccine (1 of 2) Never done   COVID-19 Vaccine (5 - 2024-25 season) 05/04/2023   Hemoglobin A1C  11/03/2023   Flu Shot  04/02/2024   Yearly kidney function blood test for diabetes  05/05/2024   Yearly kidney health urinalysis for diabetes  05/05/2024   Complete foot exam   05/10/2024   Colon Cancer Screening  08/09/2024   Medicare Annual Wellness Visit  12/23/2024   DTaP/Tdap/Td vaccine (3 - Td or Tdap) 10/10/2030   Hepatitis C Screening  Completed   HIV Screening  Completed   HPV Vaccine  Aged Out   Meningitis B Vaccine  Aged Out   Cologuard (Stool DNA test)  Discontinued    Advanced directives: (Declined) Advance directive discussed with you today. Even though you declined this today, please call our office should you change your mind, and we can give you the proper paperwork for you to fill out. Advance Care Planning is important because it:  [x]  Makes sure you receive the medical care that is consistent with your values, goals, and preferences  [x]  It provides guidance to your family and loved ones and it also reduces their decisional burden about whether or not they are making the right decisions based on what you want done  Follow the link provided  in your after visit summary or read over the paperwork we have mailed to you to help you started getting your Advance Directives in place. If you need assistance in completing these, please reach out to us  so that we can help you!   Next Medicare Annual Wellness Visit scheduled for next year: yes  Understanding Your Risk for Falls Millions of people have serious injuries from falls each year. It is important to understand your risk of falling. Talk with your health care provider about your risk and what you can do to lower it. If you do have a serious fall, make sure to tell your provider. Falling once raises your risk of falling again. How can falls affect me? Serious injuries from falls are common. These include: Broken bones, such as hip fractures. Head injuries, such as traumatic brain injuries (TBI) or concussions. A fear of falling can cause you to avoid activities and stay at home. This can make your muscles weaker and raise your risk for a fall. What can increase my risk? There are a number of risk factors that increase your risk for falling. The more risk factors you have, the higher your risk of falling. Serious injuries from a fall happen most often to people who are older than 57 years old. Teenagers and young adults ages 108-29 are also at higher risk. Common risk factors include: Weakness in the lower body. Being generally weak or confused  due to long-term (chronic) illness. Dizziness or balance problems. Poor vision. Medicines that cause dizziness or drowsiness. These may include: Medicines for your blood pressure, heart, anxiety, insomnia, or swelling (edema). Pain medicines. Muscle relaxants. Other risk factors include: Drinking alcohol. Having had a fall in the past. Having foot pain or wearing improper footwear. Working at a dangerous job. Having any of the following in your home: Tripping hazards, such as floor clutter or loose rugs. Poor lighting. Pets. Having  dementia or memory loss. What actions can I take to lower my risk of falling?     Physical activity Stay physically fit. Do strength and balance exercises. Consider taking a regular class to build strength and balance. Yoga and tai chi are good options. Vision Have your eyes checked every year and your prescription for glasses or contacts updated as needed. Shoes and walking aids Wear non-skid shoes. Wear shoes that have rubber soles and low heels. Do not wear high heels. Do not walk around the house in socks or slippers. Use a cane or walker as told by your provider. Home safety Attach secure railings on both sides of your stairs. Install grab bars for your bathtub, shower, and toilet. Use a non-skid mat in your bathtub or shower. Attach bath mats securely with double-sided, non-slip rug tape. Use good lighting in all rooms. Keep a flashlight near your bed. Make sure there is a clear path from your bed to the bathroom. Use night-lights. Do not use throw rugs. Make sure all carpeting is taped or tacked down securely. Remove all clutter from walkways and stairways, including extension cords. Repair uneven or broken steps and floors. Avoid walking on icy or slippery surfaces. Walk on the grass instead of on icy or slick sidewalks. Use ice melter to get rid of ice on walkways in the winter. Use a cordless phone. Questions to ask your health care provider Can you help me check my risk for a fall? Do any of my medicines make me more likely to fall? Should I take a vitamin D  supplement? What exercises can I do to improve my strength and balance? Should I make an appointment to have my vision checked? Do I need a bone density test to check for weak bones (osteoporosis)? Would it help to use a cane or a walker? Where to find more information Centers for Disease Control and Prevention, STEADI: TonerPromos.no Community-Based Fall Prevention Programs: TonerPromos.no [employer] on Aging:  BaseRingTones.pl Contact a health care provider if: You fall at home. You are afraid of falling at home. You feel weak, drowsy, or dizzy. This information is not intended to replace advice given to you by your health care provider. Make sure you discuss any questions you have with your health care provider. Document Revised: 04/22/2022 Document Reviewed: 04/22/2022 Elsevier Patient Education  2024 [employer].

## 2023-12-25 ENCOUNTER — Telehealth: Payer: Self-pay | Admitting: "Endocrinology

## 2023-12-25 ENCOUNTER — Other Ambulatory Visit: Payer: Self-pay

## 2023-12-25 DIAGNOSIS — E212 Other hyperparathyroidism: Secondary | ICD-10-CM

## 2023-12-25 DIAGNOSIS — E0590 Thyrotoxicosis, unspecified without thyrotoxic crisis or storm: Secondary | ICD-10-CM

## 2023-12-25 NOTE — Telephone Encounter (Signed)
 Labs updated and sent to [facility].

## 2023-12-25 NOTE — Telephone Encounter (Signed)
 Can you update labs and let me know so I can call his aunt back  Thank you

## 2023-12-27 ENCOUNTER — Other Ambulatory Visit: Payer: Self-pay | Admitting: "Endocrinology

## 2024-01-04 ENCOUNTER — Other Ambulatory Visit: Payer: Self-pay | Admitting: Family Medicine

## 2024-01-06 ENCOUNTER — Ambulatory Visit (INDEPENDENT_AMBULATORY_CARE_PROVIDER_SITE_OTHER): Admitting: "Endocrinology

## 2024-01-06 ENCOUNTER — Encounter: Payer: Self-pay | Admitting: "Endocrinology

## 2024-01-06 VITALS — BP 128/86 | HR 96 | Ht 66.0 in | Wt 182.4 lb

## 2024-01-06 DIAGNOSIS — E782 Mixed hyperlipidemia: Secondary | ICD-10-CM

## 2024-01-06 DIAGNOSIS — E212 Other hyperparathyroidism: Secondary | ICD-10-CM

## 2024-01-06 DIAGNOSIS — E559 Vitamin D deficiency, unspecified: Secondary | ICD-10-CM

## 2024-01-06 DIAGNOSIS — E0590 Thyrotoxicosis, unspecified without thyrotoxic crisis or storm: Secondary | ICD-10-CM

## 2024-01-06 DIAGNOSIS — R7303 Prediabetes: Secondary | ICD-10-CM | POA: Diagnosis not present

## 2024-01-06 LAB — COMPREHENSIVE METABOLIC PANEL WITH GFR
ALT: 34 IU/L (ref 0–44)
AST: 20 IU/L (ref 0–40)
Albumin: 4.8 g/dL (ref 3.8–4.9)
Alkaline Phosphatase: 96 IU/L (ref 44–121)
BUN/Creatinine Ratio: 12 (ref 9–20)
BUN: 12 mg/dL (ref 6–24)
Bilirubin Total: <0.2 mg/dL (ref 0.0–1.2)
CO2: 25 mmol/L (ref 20–29)
Calcium: 11.6 mg/dL — ABNORMAL HIGH (ref 8.7–10.2)
Chloride: 97 mmol/L (ref 96–106)
Creatinine, Ser: 1.03 mg/dL (ref 0.76–1.27)
Globulin, Total: 2.5 g/dL (ref 1.5–4.5)
Glucose: 98 mg/dL (ref 70–99)
Potassium: 4.3 mmol/L (ref 3.5–5.2)
Sodium: 136 mmol/L (ref 134–144)
Total Protein: 7.3 g/dL (ref 6.0–8.5)
eGFR: 85 mL/min/{1.73_m2} (ref >59)

## 2024-01-06 LAB — T3, FREE: T3, Free: 3.9 pg/mL (ref 2.0–4.4)

## 2024-01-06 LAB — POCT GLYCOSYLATED HEMOGLOBIN (HGB A1C): HbA1c, POC (controlled diabetic range): 6.3 % (ref 0.0–7.0)

## 2024-01-06 LAB — T4, FREE: Free T4: 1.32 ng/dL (ref 0.82–1.77)

## 2024-01-06 LAB — TSH: TSH: 0.359 u[IU]/mL — ABNORMAL LOW (ref 0.450–4.500)

## 2024-01-06 MED ORDER — METFORMIN HCL 500 MG PO TABS: 500.0000 mg | ORAL_TABLET | Freq: Every day | ORAL | 1 refills | Status: DC
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
## 2024-01-06 NOTE — Patient Instructions (Signed)

## 2024-01-23 ENCOUNTER — Ambulatory Visit: Payer: Self-pay | Admitting: Family Medicine

## 2024-01-27 ENCOUNTER — Other Ambulatory Visit: Payer: Self-pay | Admitting: "Endocrinology

## 2024-01-28 ENCOUNTER — Encounter: Payer: Self-pay | Admitting: Family Medicine

## 2024-02-10 ENCOUNTER — Ambulatory Visit: Payer: Self-pay | Admitting: Family Medicine

## 2024-02-18 ENCOUNTER — Other Ambulatory Visit: Payer: Self-pay | Admitting: Family Medicine

## 2024-02-24 ENCOUNTER — Other Ambulatory Visit: Payer: Self-pay | Admitting: "Endocrinology

## 2024-02-27 ENCOUNTER — Other Ambulatory Visit: Payer: Self-pay | Admitting: Family Medicine

## 2024-03-11 ENCOUNTER — Encounter: Payer: Self-pay | Admitting: Family Medicine

## 2024-03-11 ENCOUNTER — Ambulatory Visit (INDEPENDENT_AMBULATORY_CARE_PROVIDER_SITE_OTHER): Payer: Self-pay | Admitting: Family Medicine

## 2024-03-11 VITALS — BP 124/84 | HR 99 | Resp 18 | Ht 66.0 in | Wt 177.1 lb

## 2024-03-11 DIAGNOSIS — Z125 Encounter for screening for malignant neoplasm of prostate: Secondary | ICD-10-CM | POA: Insufficient documentation

## 2024-03-11 DIAGNOSIS — R3912 Poor urinary stream: Secondary | ICD-10-CM | POA: Diagnosis not present

## 2024-03-11 DIAGNOSIS — Z13820 Encounter for screening for osteoporosis: Secondary | ICD-10-CM | POA: Insufficient documentation

## 2024-03-11 DIAGNOSIS — Z23 Encounter for immunization: Secondary | ICD-10-CM | POA: Insufficient documentation

## 2024-03-11 DIAGNOSIS — E782 Mixed hyperlipidemia: Secondary | ICD-10-CM | POA: Diagnosis not present

## 2024-03-11 DIAGNOSIS — I10 Essential (primary) hypertension: Secondary | ICD-10-CM | POA: Diagnosis not present

## 2024-03-11 DIAGNOSIS — E663 Overweight: Secondary | ICD-10-CM

## 2024-03-11 DIAGNOSIS — F29 Unspecified psychosis not due to a substance or known physiological condition: Secondary | ICD-10-CM

## 2024-03-11 NOTE — Assessment & Plan Note (Signed)
 Improved  Patient re-educated about  the importance of commitment to a  minimum of 150 minutes of exercise per week as able.  The importance of healthy food choices with portion control discussed, as well as eating regularly and within a 12 hour window most days. The need to choose clean , green food 50 to 75% of the time is discussed, as well as to make water  the primary drink and set a goal of 64 ounces water  daily.       03/11/2024   11:19 AM 01/06/2024   11:18 AM 12/24/2023    2:13 PM  Weight /BMI  Weight 177 lb 1.9 oz 182 lb 6.4 oz 185 lb  Height 5' 6 (1.676 m) 5' 6 (1.676 m) 5' 6 (1.676 m)  BMI 28.59 kg/m2 29.44 kg/m2 29.86 kg/m2

## 2024-03-11 NOTE — Assessment & Plan Note (Signed)
 After obtaining informed consent, the pneumonia 20  vaccine is  administered , with no adverse effect noted at the time of administration.

## 2024-03-11 NOTE — Assessment & Plan Note (Signed)
 After obtaining informed consent, the Hep B #1 vaccine is  administered , with no adverse effect noted at the time of administration.

## 2024-03-11 NOTE — Progress Notes (Signed)
 Terry Soto     MRN: [id number]      DOB: May 29, 1966  Chief Complaint  Patient presents with   Hypertension    Follow up     HPI Terry Soto is here for follow up and re-evaluation of chronic medical conditions, medication management and review of any available recent lab and radiology data.  Preventive health is updated, specifically  Cancer screening and Immunization.   Questions or concerns regarding consultations or procedures which the PT has had in the interim are  addressed. The PT denies any adverse reactions to current medications since the last visit.  There are no new concerns.  There are no specific complaints   ROS History sup[plemented by his Uncle who is his caregiver Denies recent fever or chills. Denies sinus pressure, nasal congestion, ear pain or sore throat. Denies chest congestion, productive cough or wheezing. Denies chest pains, palpitations and leg swelling Denies abdominal pain, nausea, vomiting,diarrhea or constipation.   Denies dysuria, frequency, hesitancy or incontinence. Denies joint pain, swelling and limitation in mobility. Denies headaches, seizures, numbness, or tingling. Denies depression, anxiety or insomnia. Denies skin break down or rash.   PE  BP 124/84   Pulse 99   Resp 18   Ht 5' 6 (1.676 m)   Wt 177 lb 1.9 oz (80.3 kg)   SpO2 99%   BMI 28.59 kg/m   Patient alert and oriented and in no cardiopulmonary distress.  HEENT: No facial asymmetry, EOMI,     Neck supple .  Chest: Clear to auscultation bilaterally.  CVS: S1, S2 no murmurs, no S3.Regular rate.  ABD: Soft non tender.   Ext: No edema  MS: Adequate ROM spine, shoulders, hips and knees.  Skin: Intact, no ulcerations or rash noted.  Psych: Good eye contact, normal affect. Memory intact not anxious or depressed appearing.  CNS: CN 2-12 intact, power,  normal throughout.no focal deficits noted.   Assessment & Plan  Essential hypertension Controlled, no change in  medication DASH diet and commitment to daily physical activity for a minimum of 30 minutes discussed and encouraged, as a part of hypertension management. The importance of attaining a healthy weight is also discussed.     03/11/2024   11:47 AM 03/11/2024   11:19 AM 01/06/2024   11:18 AM 12/24/2023    2:13 PM 10/03/2023    9:35 AM 07/08/2023   10:33 AM 07/08/2023    9:48 AM  BP/Weight  Systolic BP 124 131 128 -- 136 138 137  Diastolic BP 84 88 86 -- 92 94 93  Wt. (Lbs)  177.12 182.4 185 185.6  188  BMI  28.59 kg/m2 29.44 kg/m2 29.86 kg/m2 29.96 kg/m2  30.34 kg/m2       Mixed hyperlipidemia Hyperlipidemia:Low fat diet discussed and encouraged.   Lipid Panel  Lab Results  Component Value Date   CHOL 172 05/06/2023   HDL 48 05/06/2023   LDLCALC 106 (H) 05/06/2023   TRIG 96 05/06/2023   CHOLHDL 3.6 05/06/2023     Updated lab needed at/ before next visit.   Hypercalcemia Managed by endo  Overweight (BMI 25.0-29.9) Improved  Patient re-educated about  the importance of commitment to a  minimum of 150 minutes of exercise per week as able.  The importance of healthy food choices with portion control discussed, as well as eating regularly and within a 12 hour window most days. The need to choose clean , green food 50 to 75% of the time is discussed, as  well as to make water  the primary drink and set a goal of 64 ounces water  daily.       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   Immunization due After obtaining informed consent, the Hep B *1 vaccine is  administered  , with no adverse effect noted at the time of administration.

## 2024-03-11 NOTE — Patient Instructions (Addendum)
 F/u in 6 months for follow up and hep B #3  CBC, lipid and PSA today  Pneumonia 20 and Hep B#1 today  BP is excellent  Nurse visit for Hep B #2 in 2 months  Keep up the great  work, call if you need me sooner  Thanks for choosing [facility], we consider it a privelige to serve you.

## 2024-03-11 NOTE — Assessment & Plan Note (Signed)
 Managed by endo

## 2024-03-11 NOTE — Assessment & Plan Note (Signed)
 Hyperlipidemia:Low fat diet discussed and encouraged.   Lipid Panel  Lab Results  Component Value Date   CHOL 172 05/06/2023   HDL 48 05/06/2023   LDLCALC 106 (H) 05/06/2023   TRIG 96 05/06/2023   CHOLHDL 3.6 05/06/2023     Updated lab needed at/ before next visit.

## 2024-03-11 NOTE — Assessment & Plan Note (Signed)
 Controlled, no change in medication DASH diet and commitment to daily physical activity for a minimum of 30 minutes discussed and encouraged, as a part of hypertension management. The importance of attaining a healthy weight is also discussed.     03/11/2024   11:47 AM 03/11/2024   11:19 AM 01/06/2024   11:18 AM 12/24/2023    2:13 PM 10/03/2023    9:35 AM 07/08/2023   10:33 AM 07/08/2023    9:48 AM  BP/Weight  Systolic BP 124 131 128 -- 136 138 137  Diastolic BP 84 88 86 -- 92 94 93  Wt. (Lbs)  177.12 182.4 185 185.6  188  BMI  28.59 kg/m2 29.44 kg/m2 29.86 kg/m2 29.96 kg/m2  30.34 kg/m2

## 2024-03-11 NOTE — Assessment & Plan Note (Signed)
Managed by Psychiatry and stable 

## 2024-03-25 ENCOUNTER — Ambulatory Visit: Payer: Self-pay | Admitting: Family Medicine

## 2024-03-25 LAB — LIPID PANEL
Chol/HDL Ratio: 3.6 ratio (ref 0.0–5.0)
Cholesterol, Total: 154 mg/dL (ref 100–199)
HDL: 43 mg/dL (ref >39)
LDL Chol Calc (NIH): 94 mg/dL (ref 0–99)
Triglycerides: 92 mg/dL (ref 0–149)
VLDL Cholesterol Cal: 17 mg/dL (ref 5–40)

## 2024-03-25 LAB — CBC WITH DIFFERENTIAL/PLATELET
Basophils Absolute: 0 x10E3/uL (ref 0.0–0.2)
Basos: 0 %
EOS (ABSOLUTE): 0.2 x10E3/uL (ref 0.0–0.4)
Eos: 2 %
Hematocrit: 48 % (ref 37.5–51.0)
Hemoglobin: 15.2 g/dL (ref 13.0–17.7)
Immature Grans (Abs): 0 x10E3/uL (ref 0.0–0.1)
Immature Granulocytes: 0 %
Lymphocytes Absolute: 1.5 x10E3/uL (ref 0.7–3.1)
Lymphs: 21 %
MCH: 28.6 pg (ref 26.6–33.0)
MCHC: 31.7 g/dL (ref 31.5–35.7)
MCV: 90 fL (ref 79–97)
Monocytes Absolute: 0.7 x10E3/uL (ref 0.1–0.9)
Monocytes: 9 %
Neutrophils Absolute: 4.6 x10E3/uL (ref 1.4–7.0)
Neutrophils: 68 %
Platelets: 348 x10E3/uL (ref 150–450)
RBC: 5.31 x10E6/uL (ref 4.14–5.80)
RDW: 13.3 % (ref 11.6–15.4)
WBC: 6.9 x10E3/uL (ref 3.4–10.8)

## 2024-03-25 LAB — PSA: Prostate Specific Ag, Serum: 1.2 ng/mL (ref 0.0–4.0)

## 2024-03-26 LAB — COMPREHENSIVE METABOLIC PANEL WITH GFR
ALT: 36 IU/L (ref 0–44)
AST: 21 IU/L (ref 0–40)
Albumin: 4.4 g/dL (ref 3.8–4.9)
Alkaline Phosphatase: 99 IU/L (ref 44–121)
BUN/Creatinine Ratio: 10 (ref 9–20)
BUN: 10 mg/dL (ref 6–24)
Bilirubin Total: <0.2 mg/dL (ref 0.0–1.2)
CO2: 21 mmol/L (ref 20–29)
Calcium: 11.2 mg/dL — ABNORMAL HIGH (ref 8.7–10.2)
Chloride: 100 mmol/L (ref 96–106)
Creatinine, Ser: 1 mg/dL (ref 0.76–1.27)
Globulin, Total: 2.9 g/dL (ref 1.5–4.5)
Glucose: 121 mg/dL — ABNORMAL HIGH (ref 70–99)
Potassium: 4.4 mmol/L (ref 3.5–5.2)
Sodium: 138 mmol/L (ref 134–144)
Total Protein: 7.3 g/dL (ref 6.0–8.5)
eGFR: 88 mL/min/1.73 (ref >59)

## 2024-03-26 LAB — VITAMIN D 25 HYDROXY (VIT D DEFICIENCY, FRACTURES): Vit D, 25-Hydroxy: 55.5 ng/mL (ref 30.0–100.0)

## 2024-03-26 LAB — PHOSPHORUS: Phosphorus: 2.4 mg/dL — ABNORMAL LOW (ref 2.8–4.1)

## 2024-03-26 LAB — PTH, INTACT AND CALCIUM: PTH: 17 pg/mL (ref 15–65)

## 2024-03-26 LAB — CREATININE, URINE, 24 HOUR
Creatinine, 24H Ur: 1659 mg/(24.h) (ref 1000–2000)
Creatinine, Urine: 55.3 mg/dL

## 2024-03-26 LAB — MAGNESIUM: Magnesium: 2.5 mg/dL — ABNORMAL HIGH (ref 1.6–2.3)

## 2024-04-04 ENCOUNTER — Other Ambulatory Visit: Payer: Self-pay | Admitting: Family Medicine

## 2024-05-11 ENCOUNTER — Ambulatory Visit: Admitting: "Endocrinology

## 2024-05-14 ENCOUNTER — Ambulatory Visit

## 2024-05-17 ENCOUNTER — Ambulatory Visit (INDEPENDENT_AMBULATORY_CARE_PROVIDER_SITE_OTHER): Payer: Self-pay

## 2024-05-17 DIAGNOSIS — Z23 Encounter for immunization: Secondary | ICD-10-CM

## 2024-05-17 NOTE — Progress Notes (Signed)
 Patient is in office today for a nurse visit for Hep B #2 vaccine. Patient Injection was given in the  Left arm. Patient tolerated injection well.

## 2024-05-27 ENCOUNTER — Encounter: Payer: Self-pay | Admitting: "Endocrinology

## 2024-05-27 ENCOUNTER — Ambulatory Visit (INDEPENDENT_AMBULATORY_CARE_PROVIDER_SITE_OTHER): Admitting: "Endocrinology

## 2024-05-27 VITALS — BP 128/84 | HR 120 | Ht 66.0 in | Wt 175.0 lb

## 2024-05-27 DIAGNOSIS — E0590 Thyrotoxicosis, unspecified without thyrotoxic crisis or storm: Secondary | ICD-10-CM | POA: Diagnosis not present

## 2024-05-27 DIAGNOSIS — E212 Other hyperparathyroidism: Secondary | ICD-10-CM

## 2024-05-27 DIAGNOSIS — E782 Mixed hyperlipidemia: Secondary | ICD-10-CM

## 2024-05-27 DIAGNOSIS — R7303 Prediabetes: Secondary | ICD-10-CM

## 2024-05-27 DIAGNOSIS — Z13820 Encounter for screening for osteoporosis: Secondary | ICD-10-CM

## 2024-05-27 DIAGNOSIS — E559 Vitamin D deficiency, unspecified: Secondary | ICD-10-CM

## 2024-05-27 LAB — POCT GLYCOSYLATED HEMOGLOBIN (HGB A1C): HbA1c, POC (prediabetic range): 6 % (ref 5.7–6.4)
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
## 2024-05-29 LAB — COMPREHENSIVE METABOLIC PANEL WITH GFR
ALT: 33 IU/L (ref 0–44)
AST: 21 IU/L (ref 0–40)
Albumin: 4.8 g/dL (ref 3.8–4.9)
Alkaline Phosphatase: 111 IU/L (ref 47–123)
BUN/Creatinine Ratio: 11 (ref 9–20)
BUN: 10 mg/dL (ref 6–24)
Bilirubin Total: 0.2 mg/dL (ref 0.0–1.2)
CO2: 22 mmol/L (ref 20–29)
Calcium: 11.3 mg/dL — ABNORMAL HIGH (ref 8.7–10.2)
Chloride: 100 mmol/L (ref 96–106)
Creatinine, Ser: 0.89 mg/dL (ref 0.76–1.27)
Globulin, Total: 2.5 g/dL (ref 1.5–4.5)
Glucose: 107 mg/dL — ABNORMAL HIGH (ref 70–99)
Potassium: 4.2 mmol/L (ref 3.5–5.2)
Sodium: 138 mmol/L (ref 134–144)
Total Protein: 7.3 g/dL (ref 6.0–8.5)
eGFR: 100 mL/min/1.73 (ref >59)

## 2024-05-29 LAB — T3: T3, Total: 144 ng/dL (ref 71–180)

## 2024-05-29 LAB — TSH: TSH: 0.279 u[IU]/mL — ABNORMAL LOW (ref 0.450–4.500)

## 2024-05-29 LAB — PTH, INTACT AND CALCIUM: PTH: 20 pg/mL (ref 15–65)

## 2024-05-29 LAB — T4, FREE: Free T4: 1.38 ng/dL (ref 0.82–1.77)

## 2024-06-02 LAB — SPECIMEN STATUS REPORT

## 2024-06-02 LAB — CALCIUM, URINE, 24 HOUR: Calcium, Urine: 8.9 mg/dL

## 2024-06-02 LAB — CREATININE, URINE, 24 HOUR: Creatinine, Urine: 69.8 mg/dL

## 2024-06-23 ENCOUNTER — Other Ambulatory Visit: Payer: Self-pay

## 2024-06-23 DIAGNOSIS — E212 Other hyperparathyroidism: Secondary | ICD-10-CM

## 2024-06-24 ENCOUNTER — Ambulatory Visit: Admitting: "Endocrinology

## 2024-07-03 ENCOUNTER — Other Ambulatory Visit: Payer: Self-pay | Admitting: Family Medicine

## 2024-07-03 LAB — CALCIUM, URINE, 24 HOUR
Calcium, 24H Urine: 58 mg/(24.h) (ref 0–320)
Calcium, Urine: 6.4 mg/dL

## 2024-07-03 LAB — CREATININE, URINE, 24 HOUR
Creatinine, 24H Ur: 1188 mg/(24.h) (ref 1000–2000)
Creatinine, Urine: 39.6 mg/dL

## 2024-07-12 ENCOUNTER — Encounter: Payer: Self-pay | Admitting: "Endocrinology

## 2024-07-12 ENCOUNTER — Ambulatory Visit: Admitting: "Endocrinology

## 2024-07-12 VITALS — BP 114/84 | HR 60 | Ht 66.0 in | Wt 177.6 lb

## 2024-07-12 DIAGNOSIS — E782 Mixed hyperlipidemia: Secondary | ICD-10-CM

## 2024-07-12 DIAGNOSIS — R7303 Prediabetes: Secondary | ICD-10-CM

## 2024-07-12 DIAGNOSIS — E0590 Thyrotoxicosis, unspecified without thyrotoxic crisis or storm: Secondary | ICD-10-CM | POA: Diagnosis not present

## 2024-07-12 DIAGNOSIS — E212 Other hyperparathyroidism: Secondary | ICD-10-CM

## 2024-07-12 DIAGNOSIS — E559 Vitamin D deficiency, unspecified: Secondary | ICD-10-CM
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
## 2024-08-10 ENCOUNTER — Other Ambulatory Visit: Payer: Self-pay | Admitting: Family Medicine

## 2024-09-01 ENCOUNTER — Other Ambulatory Visit: Payer: Self-pay | Admitting: "Endocrinology

## 2024-09-16 ENCOUNTER — Encounter: Payer: Self-pay | Admitting: Family Medicine

## 2024-09-16 ENCOUNTER — Ambulatory Visit: Payer: Self-pay | Admitting: Family Medicine

## 2024-09-16 VITALS — BP 137/92 | HR 76 | Resp 16 | Ht 66.0 in | Wt 174.0 lb

## 2024-09-16 DIAGNOSIS — I10 Essential (primary) hypertension: Secondary | ICD-10-CM

## 2024-09-16 DIAGNOSIS — E782 Mixed hyperlipidemia: Secondary | ICD-10-CM | POA: Diagnosis not present

## 2024-09-16 DIAGNOSIS — Z9189 Other specified personal risk factors, not elsewhere classified: Secondary | ICD-10-CM | POA: Diagnosis not present

## 2024-09-16 DIAGNOSIS — Z1211 Encounter for screening for malignant neoplasm of colon: Secondary | ICD-10-CM | POA: Diagnosis not present

## 2024-09-16 DIAGNOSIS — R7303 Prediabetes: Secondary | ICD-10-CM

## 2024-09-16 DIAGNOSIS — E1169 Type 2 diabetes mellitus with other specified complication: Secondary | ICD-10-CM | POA: Diagnosis not present

## 2024-09-16 DIAGNOSIS — E663 Overweight: Secondary | ICD-10-CM | POA: Diagnosis not present

## 2024-09-16 DIAGNOSIS — E559 Vitamin D deficiency, unspecified: Secondary | ICD-10-CM

## 2024-09-16 MED ORDER — HYDROCHLOROTHIAZIDE 12.5 MG PO CAPS: 12.5000 mg | ORAL_CAPSULE | Freq: Every day | ORAL | 4 refills | Status: AC

## 2024-09-16 NOTE — Progress Notes (Signed)
 "  Terry Soto     MRN: [id number]      DOB: 15-Sep-1965  Chief Complaint  Patient presents with   Hypertension    6 month follow up     HPI Terry Soto is here for follow up and re-evaluation of chronic medical conditions, medication management and review of any available recent lab and radiology data.  Preventive health is updated, specifically  Cancer screening and Immunization.   Questions or concerns regarding consultations or procedures which the PT has had in the interim are  addressed. The PT denies any adverse reactions to current medications since the last visit.  There are no new concerns.  There are no specific complaints   ROS Denies recent fever or chills. Denies sinus pressure, nasal congestion, ear pain or sore throat. Denies chest congestion, productive cough or wheezing. Denies chest pains, palpitations and leg swelling Denies abdominal pain, nausea, vomiting,diarrhea or constipation.   Denies dysuria, frequency, hesitancy or incontinence. Denies joint pain, swelling and limitation in mobility. Denies headaches, seizures, numbness, or tingling. Denies depression, anxiety or insomnia. Denies skin break down or rash.   PE  BP (!) 137/92   Pulse 76   Resp 16   Ht 5' 6 (1.676 m)   Wt 174 lb (78.9 kg)   SpO2 96%   BMI 28.08 kg/m   Patient alert and oriented and in no cardiopulmonary distress.  HEENT: No facial asymmetry, EOMI,     Neck supple .  Chest: Clear to auscultation bilaterally.  CVS: S1, S2 no murmurs, no S3.Regular rate.  ABD: Soft non tender.   Ext: No edema  MS: Adequate ROM spine, shoulders, hips and knees.  Skin: Intact, no ulcerations or rash noted.  Psych: Good eye contact, normal affect. Memory intact not anxious or depressed appearing.  CNS: CN 2-12 intact, power,  normal throughout.no focal deficits noted.   Assessment & Plan  Essential hypertension Uncontrolled , DASH diet and commitment to daily physical activity for a  minimum of 30 minutes discussed and encouraged, as a part of hypertension management. The importance of attaining a healthy weight is also discussed.     09/16/2024   11:12 AM 07/12/2024   10:28 AM 05/27/2024    8:58 AM 03/11/2024   11:47 AM 03/11/2024   11:19 AM 01/06/2024   11:18 AM 12/24/2023    2:13 PM  BP/Weight  Systolic BP 137 114 128 124 131 128 --  Diastolic BP 92 84 84 84 88 86 --  Wt. (Lbs) 174 177.6 175  177.12 182.4 185  BMI 28.08 kg/m2 28.67 kg/m2 28.25 kg/m2  28.59 kg/m2 29.44 kg/m2 29.86 kg/m2     Add microzide    Overweight (BMI 25.0-29.9)  Patient re-educated about  the importance of commitment to a  minimum of 150 minutes of exercise per week as able.  The importance of healthy food choices with portion control discussed, as well as eating regularly and within a 12 hour window most days. The need to choose clean , green food 50 to 75% of the time is discussed, as well as to make water  the primary drink and set a goal of 64 ounces water  daily.       09/16/2024   11:12 AM 07/12/2024   10:28 AM 05/27/2024    8:58 AM  Weight /BMI  Weight 174 lb 177 lb 9.6 oz 175 lb  Height 5' 6 (1.676 m) 5' 6 (1.676 m) 5' 6 (1.676 m)  BMI 28.08 kg/m2 28.67 kg/m2  28.25 kg/m2      Mixed hyperlipidemia Hyperlipidemia:Low fat diet discussed and encouraged.   Lipid Panel  Lab Results  Component Value Date   CHOL 154 03/24/2024   HDL 43 03/24/2024   LDLCALC 94 03/24/2024   TRIG 92 03/24/2024   CHOLHDL 3.6 03/24/2024     Updated lab needed at/ before next visit.   Vitamin D  deficiency Updated lab needed at/ before next visit.   Colon cancer screening Referred for colonoscopy which is past due  Hypercalcemia Managed by endo  Type 2 diabetes mellitus with other specified complication (HCC) Diabetes associated with hypertension and hyperlipidemia  Terry Soto is reminded of the importance of commitment to daily physical activity for 30 minutes or more, as able  and the need to limit carbohydrate intake to 30 to 60 grams per meal to help with blood sugar control.   The need to take medication as prescribed, test blood sugar as directed, and to call between visits if there is a concern that blood sugar is uncontrolled is also discussed.   Terry Soto is reminded of the importance of daily foot exam, annual eye examination, and good blood sugar, blood pressure and cholesterol control.     Latest Ref Rng & Units 05/27/2024   10:12 AM 05/27/2024    9:34 AM 03/24/2024    9:01 AM 03/24/2024    8:55 AM 01/06/2024    2:36 PM  Diabetic Labs  HbA1c 5.7 - 6.4 %  6.0    6.3   Chol 100 - 199 mg/dL   845     HDL >60 mg/dL   43     Calc LDL 0 - 99 mg/dL   94     Triglycerides 0 - 149 mg/dL   92     Creatinine 9.23 - 1.27 mg/dL 9.10    8.99        8/84/7973   11:12 AM 07/12/2024   10:28 AM 05/27/2024    8:58 AM 03/11/2024   11:47 AM 03/11/2024   11:19 AM 01/06/2024   11:18 AM 12/24/2023    2:13 PM  BP/Weight  Systolic BP 137 114 128 124 131 128 --  Diastolic BP 92 84 84 84 88 86 --  Wt. (Lbs) 174 177.6 175  177.12 182.4 185  BMI 28.08 kg/m2 28.67 kg/m2 28.25 kg/m2  28.59 kg/m2 29.44 kg/m2 29.86 kg/m2      09/16/2024   11:20 AM 05/06/2023    9:40 AM  Foot/eye exam completion dates  Foot Form Completion Done Done        At increased risk for cardiovascular disease Coronary calcium  screen recommended and pt will have  this done  "

## 2024-09-16 NOTE — Assessment & Plan Note (Signed)
" °  Patient re-educated about  the importance of commitment to a  minimum of 150 minutes of exercise per week as able.  The importance of healthy food choices with portion control discussed, as well as eating regularly and within a 12 hour window most days. The need to choose clean , green food 50 to 75% of the time is discussed, as well as to make water  the primary drink and set a goal of 64 ounces water  daily.       09/16/2024   11:12 AM 07/12/2024   10:28 AM 05/27/2024    8:58 AM  Weight /BMI  Weight 174 lb 177 lb 9.6 oz 175 lb  Height 5' 6 (1.676 m) 5' 6 (1.676 m) 5' 6 (1.676 m)  BMI 28.08 kg/m2 28.67 kg/m2 28.25 kg/m2     "

## 2024-09-16 NOTE — Assessment & Plan Note (Signed)
 Hyperlipidemia:Low fat diet discussed and encouraged.   Lipid Panel  Lab Results  Component Value Date   CHOL 154 03/24/2024   HDL 43 03/24/2024   LDLCALC 94 03/24/2024   TRIG 92 03/24/2024   CHOLHDL 3.6 03/24/2024     Updated lab needed at/ before next visit.

## 2024-09-16 NOTE — Patient Instructions (Addendum)
 F/U in  early April,  re evaluate blood pressure   Urine ACR, HBA1Cm lipid , cmp and eGFR today  New additional med for blood pressure is microzide  one daily  You are being referred for heart screening test  and colonoscopy  .Thanks for choosing [facility], we consider it a privelige to serve you.

## 2024-09-16 NOTE — Progress Notes (Incomplete)
" ° °  Terry Soto     MRN: [id number]      DOB: 07-20-1966  Chief Complaint  Patient presents with   Hypertension    6 month follow up     HPI Terry Soto is here for follow up and re-evaluation of chronic medical conditions, medication management and review of any available recent lab and radiology data.  Preventive health is updated, specifically  Cancer screening and Immunization.   Questions or concerns regarding consultations or procedures which the PT has had in the interim are  addressed. The PT denies any adverse reactions to current medications since the last visit.  There are no new concerns.  There are no specific complaints   ROS Denies recent fever or chills. Denies sinus pressure, nasal congestion, ear pain or sore throat. Denies chest congestion, productive cough or wheezing. Denies chest pains, palpitations and leg swelling Denies abdominal pain, nausea, vomiting,diarrhea or constipation.   Denies dysuria, frequency, hesitancy or incontinence. Denies joint pain, swelling and limitation in mobility. Denies headaches, seizures, numbness, or tingling. Denies depression, anxiety or insomnia. Denies skin break down or rash.   PE  BP (!) 137/92   Pulse 76   Resp 16   Ht 5' 6 (1.676 m)   Wt 174 lb (78.9 kg)   SpO2 96%   BMI 28.08 kg/m   Patient alert and oriented and in no cardiopulmonary distress.  HEENT: No facial asymmetry, EOMI,     Neck supple .  Chest: Clear to auscultation bilaterally.  CVS: S1, S2 no murmurs, no S3.Regular rate.  ABD: Soft non tender.   Ext: No edema  MS: Adequate ROM spine, shoulders, hips and knees.  Skin: Intact, no ulcerations or rash noted.  Psych: Good eye contact, normal affect. Memory intact not anxious or depressed appearing.  CNS: CN 2-12 intact, power,  normal throughout.no focal deficits noted.   Assessment & Plan  No problem-specific Assessment & Plan notes found for this encounter.  "

## 2024-09-16 NOTE — Assessment & Plan Note (Signed)
 Uncontrolled , DASH diet and commitment to daily physical activity for a minimum of 30 minutes discussed and encouraged, as a part of hypertension management. The importance of attaining a healthy weight is also discussed.     09/16/2024   11:12 AM 07/12/2024   10:28 AM 05/27/2024    8:58 AM 03/11/2024   11:47 AM 03/11/2024   11:19 AM 01/06/2024   11:18 AM 12/24/2023    2:13 PM  BP/Weight  Systolic BP 137 114 128 124 131 128 --  Diastolic BP 92 84 84 84 88 86 --  Wt. (Lbs) 174 177.6 175  177.12 182.4 185  BMI 28.08 kg/m2 28.67 kg/m2 28.25 kg/m2  28.59 kg/m2 29.44 kg/m2 29.86 kg/m2     Add microzide 

## 2024-09-16 NOTE — Assessment & Plan Note (Signed)
 Updated lab needed at/ before next visit.

## 2024-09-17 ENCOUNTER — Encounter: Payer: Self-pay | Admitting: Family Medicine

## 2024-09-17 ENCOUNTER — Ambulatory Visit: Payer: Self-pay | Admitting: Family Medicine

## 2024-09-17 DIAGNOSIS — Z9189 Other specified personal risk factors, not elsewhere classified: Secondary | ICD-10-CM | POA: Insufficient documentation

## 2024-09-17 DIAGNOSIS — E1169 Type 2 diabetes mellitus with other specified complication: Secondary | ICD-10-CM | POA: Insufficient documentation

## 2024-09-17 NOTE — Assessment & Plan Note (Signed)
 Diabetes associated with hypertension and hyperlipidemia  Terry Soto is reminded of the importance of commitment to daily physical activity for 30 minutes or more, as able and the need to limit carbohydrate intake to 30 to 60 grams per meal to help with blood sugar control.   The need to take medication as prescribed, test blood sugar as directed, and to call between visits if there is a concern that blood sugar is uncontrolled is also discussed.   Terry Soto is reminded of the importance of daily foot exam, annual eye examination, and good blood sugar, blood pressure and cholesterol control.     Latest Ref Rng & Units 05/27/2024   10:12 AM 05/27/2024    9:34 AM 03/24/2024    9:01 AM 03/24/2024    8:55 AM 01/06/2024    2:36 PM  Diabetic Labs  HbA1c 5.7 - 6.4 %  6.0    6.3   Chol 100 - 199 mg/dL   845     HDL >60 mg/dL   43     Calc LDL 0 - 99 mg/dL   94     Triglycerides 0 - 149 mg/dL   92     Creatinine 9.23 - 1.27 mg/dL 9.10    8.99        8/84/7973   11:12 AM 07/12/2024   10:28 AM 05/27/2024    8:58 AM 03/11/2024   11:47 AM 03/11/2024   11:19 AM 01/06/2024   11:18 AM 12/24/2023    2:13 PM  BP/Weight  Systolic BP 137 114 128 124 131 128 --  Diastolic BP 92 84 84 84 88 86 --  Wt. (Lbs) 174 177.6 175  177.12 182.4 185  BMI 28.08 kg/m2 28.67 kg/m2 28.25 kg/m2  28.59 kg/m2 29.44 kg/m2 29.86 kg/m2      09/16/2024   11:20 AM 05/06/2023    9:40 AM  Foot/eye exam completion dates  Foot Form Completion Done Done

## 2024-09-17 NOTE — Assessment & Plan Note (Signed)
 Coronary calcium  screen recommended and pt will have  this done

## 2024-09-17 NOTE — Assessment & Plan Note (Signed)
 Referred for colonoscopy which is past due

## 2024-09-17 NOTE — Assessment & Plan Note (Signed)
 Managed by endo

## 2024-09-18 LAB — LIPID PANEL
Chol/HDL Ratio: 3 ratio (ref 0.0–5.0)
Cholesterol, Total: 154 mg/dL (ref 100–199)
HDL: 51 mg/dL
LDL Chol Calc (NIH): 89 mg/dL (ref 0–99)
Triglycerides: 72 mg/dL (ref 0–149)
VLDL Cholesterol Cal: 14 mg/dL (ref 5–40)

## 2024-09-18 LAB — CMP14+EGFR
ALT: 35 IU/L (ref 0–44)
AST: 19 IU/L (ref 0–40)
Albumin: 5 g/dL — ABNORMAL HIGH (ref 3.8–4.9)
Alkaline Phosphatase: 118 IU/L (ref 47–123)
BUN/Creatinine Ratio: 9 (ref 9–20)
BUN: 8 mg/dL (ref 6–24)
Bilirubin Total: 0.3 mg/dL (ref 0.0–1.2)
CO2: 24 mmol/L (ref 20–29)
Calcium: 11.3 mg/dL — ABNORMAL HIGH (ref 8.7–10.2)
Chloride: 99 mmol/L (ref 96–106)
Creatinine, Ser: 0.91 mg/dL (ref 0.76–1.27)
Globulin, Total: 2.7 g/dL (ref 1.5–4.5)
Glucose: 95 mg/dL (ref 70–99)
Potassium: 4.3 mmol/L (ref 3.5–5.2)
Sodium: 141 mmol/L (ref 134–144)
Total Protein: 7.7 g/dL (ref 6.0–8.5)
eGFR: 98 mL/min/1.73

## 2024-09-18 LAB — HEMOGLOBIN A1C
Est. average glucose Bld gHb Est-mCnc: 128 mg/dL
Hgb A1c MFr Bld: 6.1 % — ABNORMAL HIGH (ref 4.8–5.6)

## 2024-09-18 LAB — MICROALBUMIN / CREATININE URINE RATIO
Creatinine, Urine: 32 mg/dL
Microalb/Creat Ratio: 18 mg/g{creat} (ref 0–29)
Microalbumin, Urine: 5.8 ug/mL

## 2024-09-30 ENCOUNTER — Other Ambulatory Visit: Payer: Self-pay | Admitting: Family Medicine

## 2024-10-05 ENCOUNTER — Other Ambulatory Visit (HOSPITAL_BASED_OUTPATIENT_CLINIC_OR_DEPARTMENT_OTHER)

## 2024-12-15 ENCOUNTER — Ambulatory Visit: Payer: Self-pay | Admitting: Family Medicine

## 2024-12-28 ENCOUNTER — Ambulatory Visit

## 2025-01-11 ENCOUNTER — Ambulatory Visit: Admitting: "Endocrinology
